# Patient Record
Sex: Male | Born: 1965 | Race: White | Hispanic: No | Marital: Married | Smoking: Never smoker
Health system: Southern US, Community
[De-identification: ages and names within clinical notes are randomized; demographics above are authoritative.]

## PROBLEM LIST (undated history)

## (undated) DIAGNOSIS — E785 Hyperlipidemia, unspecified: Secondary | ICD-10-CM

## (undated) DIAGNOSIS — N3281 Overactive bladder: Secondary | ICD-10-CM

## (undated) DIAGNOSIS — R030 Elevated blood-pressure reading, without diagnosis of hypertension: Secondary | ICD-10-CM

## (undated) HISTORY — DX: Elevated blood-pressure reading, without diagnosis of hypertension: R03.0

## (undated) HISTORY — PX: BACK SURGERY: SHX140

## (undated) HISTORY — DX: Overactive bladder: N32.81

## (undated) HISTORY — DX: Hyperlipidemia, unspecified: E78.5

---

## 1994-07-30 HISTORY — PX: OTHER SURGICAL HISTORY: SHX169

## 1995-07-31 HISTORY — PX: RHINOPLASTY: SHX2354

## 2007-05-27 ENCOUNTER — Ambulatory Visit: Payer: Self-pay | Admitting: Family Medicine

## 2007-05-27 DIAGNOSIS — J069 Acute upper respiratory infection, unspecified: Secondary | ICD-10-CM | POA: Insufficient documentation

## 2007-05-27 DIAGNOSIS — K5190 Ulcerative colitis, unspecified, without complications: Secondary | ICD-10-CM | POA: Insufficient documentation

## 2007-05-27 DIAGNOSIS — L259 Unspecified contact dermatitis, unspecified cause: Secondary | ICD-10-CM | POA: Insufficient documentation

## 2007-09-01 ENCOUNTER — Ambulatory Visit: Payer: Self-pay | Admitting: Family Medicine

## 2007-09-01 DIAGNOSIS — J019 Acute sinusitis, unspecified: Secondary | ICD-10-CM | POA: Insufficient documentation

## 2007-11-20 ENCOUNTER — Ambulatory Visit: Payer: Self-pay | Admitting: Family Medicine

## 2007-11-25 ENCOUNTER — Encounter: Payer: Self-pay | Admitting: Family Medicine

## 2007-11-25 LAB — CONVERTED CEMR LAB
ALT: 21 units/L (ref 0–53)
AST: 26 units/L (ref 0–37)
Albumin: 4.5 g/dL (ref 3.5–5.2)
CO2: 23 meq/L (ref 19–32)
Creatinine, Ser: 1.27 mg/dL (ref 0.40–1.50)
MCHC: 33.2 g/dL (ref 30.0–36.0)
MCV: 89.5 fL (ref 78.0–100.0)
RBC: 4.78 M/uL (ref 4.22–5.81)
Sodium: 143 meq/L (ref 135–145)
Total Bilirubin: 0.8 mg/dL (ref 0.3–1.2)
Total Protein: 6.8 g/dL (ref 6.0–8.3)
Triglycerides: 116 mg/dL (ref <150)
VLDL: 23 mg/dL (ref 0–40)

## 2007-11-26 ENCOUNTER — Encounter: Payer: Self-pay | Admitting: Family Medicine

## 2008-03-01 ENCOUNTER — Encounter: Payer: Self-pay | Admitting: Family Medicine

## 2008-03-03 ENCOUNTER — Encounter: Payer: Self-pay | Admitting: Family Medicine

## 2008-03-05 ENCOUNTER — Telehealth: Payer: Self-pay | Admitting: Family Medicine

## 2008-03-08 ENCOUNTER — Encounter: Payer: Self-pay | Admitting: Family Medicine

## 2008-04-01 ENCOUNTER — Encounter: Payer: Self-pay | Admitting: Family Medicine

## 2008-04-19 ENCOUNTER — Encounter: Payer: Self-pay | Admitting: Family Medicine

## 2008-06-10 ENCOUNTER — Encounter: Payer: Self-pay | Admitting: Family Medicine

## 2008-09-21 ENCOUNTER — Ambulatory Visit: Payer: Self-pay | Admitting: Occupational Medicine

## 2008-09-21 DIAGNOSIS — M940 Chondrocostal junction syndrome [Tietze]: Secondary | ICD-10-CM | POA: Insufficient documentation

## 2008-12-16 ENCOUNTER — Encounter: Payer: Self-pay | Admitting: Family Medicine

## 2009-02-10 ENCOUNTER — Encounter: Payer: Self-pay | Admitting: Family Medicine

## 2009-02-23 ENCOUNTER — Ambulatory Visit: Payer: Self-pay | Admitting: Family Medicine

## 2009-02-23 DIAGNOSIS — R0602 Shortness of breath: Secondary | ICD-10-CM | POA: Insufficient documentation

## 2009-02-24 ENCOUNTER — Encounter: Payer: Self-pay | Admitting: Family Medicine

## 2009-03-01 LAB — CONVERTED CEMR LAB
ALT: 17 U/L
AST: 21 U/L
Albumin: 4.6 g/dL
Alkaline Phosphatase: 80 U/L
BUN: 17 mg/dL
CO2: 27 meq/L
Calcium: 9.5 mg/dL
Chloride: 104 meq/L
Cholesterol: 202 mg/dL — ABNORMAL HIGH
Creatinine, Ser: 1.19 mg/dL
Glucose, Bld: 81 mg/dL
HCT: 47.2 %
HDL: 42 mg/dL
Hemoglobin: 16.2 g/dL
LDL Cholesterol: 135 mg/dL — ABNORMAL HIGH
MCHC: 34.3 g/dL
MCV: 90.1 fL
Platelets: 205 10*3/uL
Potassium: 4.2 meq/L
RBC: 5.24 M/uL
RDW: 13.2 %
Sodium: 140 meq/L
Total Bilirubin: 0.8 mg/dL
Total CHOL/HDL Ratio: 4.8
Total Protein: 7 g/dL
Triglycerides: 123 mg/dL
VLDL: 25 mg/dL
WBC: 5.7 10*3/uL

## 2009-04-07 ENCOUNTER — Encounter: Payer: Self-pay | Admitting: Family Medicine

## 2009-04-08 ENCOUNTER — Ambulatory Visit: Payer: Self-pay | Admitting: Family Medicine

## 2009-05-17 ENCOUNTER — Encounter: Payer: Self-pay | Admitting: Family Medicine

## 2009-12-30 ENCOUNTER — Ambulatory Visit: Payer: Self-pay | Admitting: Family Medicine

## 2009-12-30 DIAGNOSIS — S63509A Unspecified sprain of unspecified wrist, initial encounter: Secondary | ICD-10-CM | POA: Insufficient documentation

## 2010-01-05 ENCOUNTER — Ambulatory Visit: Payer: Self-pay | Admitting: Family Medicine

## 2010-01-12 ENCOUNTER — Encounter: Admission: RE | Admit: 2010-01-12 | Discharge: 2010-01-12 | Payer: Self-pay | Admitting: Sports Medicine

## 2010-01-23 ENCOUNTER — Encounter: Payer: Self-pay | Admitting: Family Medicine

## 2010-02-28 ENCOUNTER — Encounter: Payer: Self-pay | Admitting: Family Medicine

## 2010-03-03 ENCOUNTER — Encounter: Payer: Self-pay | Admitting: Family Medicine

## 2010-03-06 ENCOUNTER — Encounter: Payer: Self-pay | Admitting: Family Medicine

## 2010-04-05 ENCOUNTER — Encounter: Payer: Self-pay | Admitting: Family Medicine

## 2010-05-19 ENCOUNTER — Encounter: Payer: Self-pay | Admitting: Family Medicine

## 2010-08-24 ENCOUNTER — Encounter: Payer: Self-pay | Admitting: Family Medicine

## 2010-08-29 NOTE — Assessment & Plan Note (Signed)
Summary: RIGHT WRIST PAIN   Vital Signs:  Patient Profile:   45 Years Old Male CC:      right wrist injury f/u Height:     72.75 inches O2 Sat:      97 % O2 treatment:    Room Air Temp:     98.4 degrees F oral Pulse rate:   93 / minute Resp:     14 per minute BP sitting:   138 / 82  (left arm) Cuff size:   large  Pt. in pain?   yes    Location:   right wrist    Intensity:   4    Type:       aching  Vitals Entered By: Lajean Saver RN (January 05, 2010 1:51 PM)                   Current Allergies (reviewed today): No known allergies History of Present Illness Chief Complaint: right wrist injury f/u History of Present Illness: RECHECK INJURY TO RIGHT WRIST. SEEN HERE 5-6 DAYS AGO. XRAY SHOWS POSS LIGMENTIS INJURY . NO FX. WORE AN ACE WRAP AND SPLINT FOR A FEW DAYS. STATES SOME IMPROVMENT. SWELLING. DOWN. NO NUMBNESS OR TINGLING.   REVIEW OF SYSTEMS Constitutional Symptoms      Denies fever, chills, night sweats, weight loss, weight gain, and fatigue.  Eyes       Denies change in vision, eye pain, eye discharge, glasses, contact lenses, and eye surgery. Ear/Nose/Throat/Mouth       Denies hearing loss/aids, change in hearing, ear pain, ear discharge, dizziness, frequent runny nose, frequent nose bleeds, sinus problems, sore throat, hoarseness, and tooth pain or bleeding.  Respiratory       Denies dry cough, productive cough, wheezing, shortness of breath, asthma, bronchitis, and emphysema/COPD.  Cardiovascular       Denies murmurs, chest pain, and tires easily with exhertion.    Gastrointestinal       Denies stomach pain, nausea/vomiting, diarrhea, constipation, blood in bowel movements, and indigestion. Genitourniary       Denies painful urination, kidney stones, and loss of urinary control. Neurological       Denies paralysis, seizures, and fainting/blackouts. Musculoskeletal       Complains of joint pain, decreased range of motion, and swelling.      Denies muscle  pain, joint stiffness, redness, muscle weakness, and gout.      Comments: right wrist Skin       Denies bruising, unusual mles/lumps or sores, and hair/skin or nail changes.  Psych       Denies mood changes, temper/anger issues, anxiety/stress, speech problems, depression, and sleep problems.  Past History:  Past Medical History: Reviewed history from 11/20/2007 and no changes required. ulcerative colitis -  hearing loss, R ear with hearing aid (new one in 2008) OAB or BPH?  Dr Janna [facility]   Past Surgical History: Reviewed history from 05/27/2007 and no changes required. colonoscopies, last one end of 2007 rhinoplasty 1997 R ear surgery for scar tissue removal 1996  Family History: Reviewed history from 05/27/2007 and no changes required. mother high chol and HTN father died at 86 ? cause 2 brothers healthy  Social History: Reviewed history from 12/30/2009 and no changes required. 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  Mom lives with them. Married to [employer].  2 kids. Never smoked. No ETOH. Works out 3 days / wk. Physical Exam General appearance:  well developed, well nourished, no acute distress Extremities: NO SWELLING OR DEFORMITY. PAIN ULNAR SIDE OF THE WRIST WITH ROM AND ABDUCTION. SLIGHT PAIN WITH EXTENTION OF THE THUMB. N/V INTACT.  SUPSPECT POSS TFC INJURY. WILL TX WITH PREDNISONE AND SPLINT FOR 7 DAYS. IF NO IMPROVEMENT WITLL RECOMMEND ORTHO HAND EVAL.   Plan New Medications/Changes: PREDNISONE (PAK) 5 MG TABS (PREDNISONE) TAKE AS DIRECTED WITH FOOD  #1 PK  6 DAY x 0, 01/05/2010, Marvis Moeller DO  New Orders: Est. Patient Level II [13086]   Prescriptions: PREDNISONE (PAK) 5 MG TABS (PREDNISONE) TAKE AS DIRECTED WITH FOOD  #1 PK  6 DAY x 0   Entered and Authorized by:   Marvis Moeller DO   Signed by:   Marvis Moeller DO on 01/05/2010   Method used:   Print then Give to Patient   RxID:    [id number]   Patient Instructions: 1)  WEAR THE SPLINT 24/7, APPLLY HEAT THREE TIMES DAILY FOR 15 MIN. IF NO IMPROVEMENT AT 7 DAYS RECOMMEND ORHO EVAL.   Orders Added: 1)  Est. Patient Level II [44010]

## 2010-08-29 NOTE — Letter (Signed)
Summary: Out of Work  [facility]  [address]   [place], [place] [address]   Phone: [phone]  Fax: [phone]    January 05, 2010   Employee:  Benjamin Leonard    To Whom It May Concern:   For Medical reasons, please excuse the above named employee from work for the following dates:  Start:   04 January 2010  End:   11 January 2010  If you need additional information, please feel free to contact our office.         Sincerely,    Marvis Moeller DO

## 2010-08-29 NOTE — Letter (Signed)
Summary: Out of Work  [facility]  [address]   [place], [place] [address]   Phone: [phone]  Fax: [phone]    January 05, 2010   Employee:  SANDON YOHO    To Whom It May Concern:   For Medical reasons, please excuse the above named employee from work for the following dates:  Start:   05 January 2010  End:   12 January 2010  If you need additional information, please feel free to contact our office.         Sincerely,    Marvis Moeller DO

## 2010-08-29 NOTE — Miscellaneous (Signed)
Summary: colonscopy: ulcerative colitis, rectum only  Clinical Lists Changes  Observations: Added new observation of FLEXSIGDUE: 03/01/2013 (03/03/2010 12:50) Added new observation of COLONOSCOPY: Location:  [facility].    abnormal mucosa in the rectum compatible with ulcerative colitis (biopsied). o/w normal.    (03/02/2010 12:51)        Colonoscopy  Procedure date:  03/02/2010  Findings:      Location:  [facility].    abnormal mucosa in the rectum compatible with ulcerative colitis (biopsied). o/w normal.      Colonoscopy  Procedure date:  03/02/2010  Findings:      Location:  [facility].    abnormal mucosa in the rectum compatible with ulcerative colitis (biopsied). o/w normal.

## 2010-08-29 NOTE — Letter (Signed)
Summary: Letter to Patient Regarding Colonoscopy Results/[facility]  Letter to Patient Regarding Colonoscopy Results/[facility]   Imported By: Lanelle Bal 03/14/2010 11:39:18  _____________________________________________________________________  External Attachment:    Type:   Image     Comment:   External Document

## 2010-08-29 NOTE — Letter (Signed)
Summary: Out of Work  [facility]  [address]   [place], [place] [address]   Phone: [phone]  Fax: [phone]    December 30, 2009   Employee:  ALIF PETRAK    To Whom It May Concern:   For Medical reasons, please excuse the above named employee from work for one week.  Start:   December 30, 2009  End:   January 06, 2010  If you need additional information, please feel free to contact our office.         Sincerely,    Donna Christen MD

## 2010-08-29 NOTE — Letter (Signed)
Summary: Letter to Patient with Lab Results/[facility]   Letter to Patient with Lab Results/[facility]   Imported By: Lanelle Bal 03/09/2010 12:35:14  _____________________________________________________________________  External Attachment:    Type:   Image     Comment:   External Document

## 2010-08-29 NOTE — Letter (Signed)
Summary: External Other  External Other   Imported By: Haskell Riling 01/23/2010 11:22:25  _____________________________________________________________________  External Attachment:    Type:   Image     Comment:   External Document

## 2010-08-29 NOTE — Consult Note (Signed)
Summary: [facility]  [facility]   Imported By: Lanelle Bal 04/13/2010 11:49:34  _____________________________________________________________________  External Attachment:    Type:   Image     Comment:   External Document

## 2010-08-29 NOTE — Assessment & Plan Note (Signed)
Summary: R WRIST INJURY/WB   Vital Signs:  Patient Profile:   45 Years Old Male CC:      injury to right wrist X yesterday Height:     72.75 inches Weight:      191 pounds O2 Sat:      100 % O2 treatment:    Room Air Temp:     97.3 degrees F oral Pulse rate:   56 / minute Pulse rhythm:   regular Resp:     16 per minute BP sitting:   137 / 78  (left arm) Cuff size:   large  Pt. in pain?   yes    Location:   right wrist    Intensity:   4    Type:       aching  Vitals Entered By: Lajean Saver RN (December 30, 2009 1:11 PM)                   Updated Prior Medication List: LIALDA 1.2 GM TBEC (MESALAMINE) 2 tabs by mouth daily  Current Allergies (reviewed today): No known allergies History of Present Illness Chief Complaint: injury to right wrist X yesterday History of Present Illness: Subjective:  Patient injured his right wrist yesterday with a karati chop to a board.  Complains of pain in both ulnar wrist and thumb area.  REVIEW OF SYSTEMS Constitutional Symptoms      Denies fever, chills, night sweats, weight loss, weight gain, and fatigue.  Eyes       Denies change in vision, eye pain, eye discharge, glasses, contact lenses, and eye surgery. Ear/Nose/Throat/Mouth       Denies hearing loss/aids, change in hearing, ear pain, ear discharge, dizziness, frequent runny nose, frequent nose bleeds, sinus problems, sore throat, hoarseness, and tooth pain or bleeding.  Respiratory       Denies dry cough, productive cough, wheezing, shortness of breath, asthma, bronchitis, and emphysema/COPD.  Cardiovascular       Denies murmurs, chest pain, and tires easily with exhertion.    Gastrointestinal       Denies stomach pain, nausea/vomiting, diarrhea, constipation, blood in bowel movements, and indigestion. Genitourniary       Denies painful urination, kidney stones, and loss of urinary control. Neurological       Denies paralysis, seizures, and  fainting/blackouts. Musculoskeletal       Complains of joint pain, joint stiffness, decreased range of motion, and swelling.      Denies muscle pain, redness, muscle weakness, and gout.      Comments: right wrist Skin       Denies bruising, unusual mles/lumps or sores, and hair/skin or nail changes.  Psych       Denies mood changes, temper/anger issues, anxiety/stress, speech problems, depression, and sleep problems. Other Comments: patient was attempting to break boards with his right hand and came down on the base of his wrist    Past History:  Past Medical History: Reviewed history from 11/20/2007 and no changes required. ulcerative colitis -  hearing loss, R ear with hearing aid (new one in 2008) OAB or BPH?  Dr Janna [facility]   Past Surgical History: Reviewed history from 05/27/2007 and no changes required. colonoscopies, last one end of 2007 rhinoplasty 1997 R ear surgery for scar tissue removal 1996  Family History: Reviewed history from 05/27/2007 and no changes required. mother high chol and HTN father died at 65 ? 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  Mom lives with them. Married to [employer].  2 kids. Never smoked. No ETOH. Works out 3 days / wk.   Objective:  No acute distress  Right wrist:  No swelling or deformity.  Decreased range of motion.  No snuffbox tenderness.  Thumb has decreased range of motion.  There is mild tenderness over thumb extensors.  Distal neurovascular intact  X-ray right wrist:  No fracture.  Slight widening of the luno-triquetral joint space. Assessment New Problems: WRIST SPRAIN, RIGHT (ICD-842.00)   Plan New Medications/Changes: NAPROXEN 500 MG TABS (NAPROXEN) One by mouth two times a day pc  #20 x 1, 12/30/2009, Donna Christen MD  New Orders: T-DG Wrist Complete*R* [73110] Wrist/Thumb Spica [L3923] Ace  Bandage < 3in. [R6045] Est.  Patient Level III [40981] Planning Comments:   Ace wrap and thumb spica splint applied.  Apply ice pack for 30 to 45 minutes every 1 to 4 hours.  Continue until swelling decreases.   Begin Naproxen. Begin wrist exercises in about 5 days ([facility] information and instruction patient handout given)  Follow-up with PCP if not improving 7 to 10 days.   The patient and/or caregiver has been counseled thoroughly with regard to medications prescribed including dosage, schedule, interactions, rationale for use, and possible side effects and they verbalize understanding.  Diagnoses and expected course of recovery discussed and will return if not improved as expected or if the condition worsens. Patient and/or caregiver verbalized understanding.  Prescriptions: NAPROXEN 500 MG TABS (NAPROXEN) One by mouth two times a day pc  #20 x 1   Entered and Authorized by:   Donna Christen MD   Signed by:   Donna Christen MD on 12/30/2009   Method used:   Print then Give to Patient   RxID:   [id number]   Orders Added: 1)  T-DG Wrist Complete*R* [73110] 2)  Wrist/Thumb Spica [L3923] 3)  Ace  Bandage < 3in. [V7846] 4)  Est. Patient Level III [96295]

## 2010-08-29 NOTE — Consult Note (Signed)
Summary: [facility]  [facility]   Imported By: Lanelle Bal 06/10/2010 11:53:46  _____________________________________________________________________  External Attachment:    Type:   Image     Comment:   External Document

## 2010-09-20 NOTE — Letter (Signed)
Summary: [facility]   [facility]   Imported By: Kassie Mends 09/14/2010 08:45:32  _____________________________________________________________________  External Attachment:    Type:   Image     Comment:   External Document

## 2010-10-10 ENCOUNTER — Encounter: Payer: Self-pay | Admitting: Internal Medicine

## 2010-10-19 ENCOUNTER — Encounter: Payer: Self-pay | Admitting: Internal Medicine

## 2010-10-19 ENCOUNTER — Ambulatory Visit (INDEPENDENT_AMBULATORY_CARE_PROVIDER_SITE_OTHER): Payer: BC Managed Care – PPO | Admitting: Internal Medicine

## 2010-10-19 DIAGNOSIS — M25562 Pain in left knee: Secondary | ICD-10-CM | POA: Insufficient documentation

## 2010-10-19 DIAGNOSIS — N529 Male erectile dysfunction, unspecified: Secondary | ICD-10-CM

## 2010-10-19 DIAGNOSIS — K5190 Ulcerative colitis, unspecified, without complications: Secondary | ICD-10-CM

## 2010-10-19 DIAGNOSIS — R5383 Other fatigue: Secondary | ICD-10-CM | POA: Insufficient documentation

## 2010-10-19 DIAGNOSIS — J309 Allergic rhinitis, unspecified: Secondary | ICD-10-CM

## 2010-10-19 DIAGNOSIS — M25569 Pain in unspecified knee: Secondary | ICD-10-CM

## 2010-10-19 DIAGNOSIS — M25561 Pain in right knee: Secondary | ICD-10-CM

## 2010-10-19 DIAGNOSIS — J302 Other seasonal allergic rhinitis: Secondary | ICD-10-CM

## 2010-10-19 DIAGNOSIS — R5381 Other malaise: Secondary | ICD-10-CM | POA: Insufficient documentation

## 2010-10-19 DIAGNOSIS — Z0000 Encounter for general adult medical examination without abnormal findings: Secondary | ICD-10-CM

## 2010-10-19 DIAGNOSIS — J3089 Other allergic rhinitis: Secondary | ICD-10-CM | POA: Insufficient documentation

## 2010-10-19 DIAGNOSIS — M2550 Pain in unspecified joint: Secondary | ICD-10-CM

## 2010-10-19 NOTE — Assessment & Plan Note (Signed)
Normal exam Pt noted stiffness after prolonged sitting.  Question symptoms extra GI manifestations of IBD Check sed rate Consider rheumatology eval

## 2010-10-19 NOTE — Patient Instructions (Addendum)
Take vitamin D 3 1000 units once daily Our office will contact you re:  Blood test results

## 2010-10-19 NOTE — Assessment & Plan Note (Signed)
Reviewed adult health maintenance protocols. Pt uptodate with adult vaccines Obtain screening lipid panel Continue regular exerise - weight is within normal limits  Screening colonoscopy performed q 2 yrs by GI

## 2010-10-19 NOTE — Assessment & Plan Note (Signed)
Pt with chronic fatigue of unclear etiology.  He is concerned he may have underactive thyroid. No obvious abnormality on exam  Rule out hypogonadism, hypothyroidism  Consider adrenal insuff,  Chronic allergies

## 2010-10-19 NOTE — Progress Notes (Signed)
  Subjective:    Patient ID: Benjamin Leonard, male    DOB: 04-Jun-1966, 45 y.o.   MRN: [id number]  HPI 45 y/o male to x fer care, routine, cpx and several complaints.  Diagnosed with UC age 30.  He was passing blood, cramping, loose stools Pt told he has mild case.   Since [facility]  [facility] Dr. Jason Fila Karis [place]) Colonoscopy q 2 yrs Tends to gets more proctitis -  Uses canasa supp with great response  Originally from [place] Works as free Conservation officer, historic buildings.   C/o severe fatigue -  " I could sleep all day",  Joint pains - esp knees Feels foggy - head is in a fog Reports rash on face from folliculitis took too long to heal  Mild ED.   Normal libido.   Able to get erection but lack of firmness  Pt exercises on regular basis.   Doing P90X but wt around abd not coming off  Intermittent cough  Weak stream.  BPH symptoms- followed by urologist in [place].  Tried vesicare and uroxatral uroxatral worked for 1-2 yrs but lost effectiveness   Review of Systems  Constitutional: Positive for fatigue. Negative for fever, chills and unexpected weight change.  HENT: Positive for congestion and postnasal drip. Negative for rhinorrhea.   Eyes: Negative for pain and visual disturbance.  Respiratory: Negative for cough, chest tightness and shortness of breath.   Cardiovascular: Negative for chest pain and palpitations.  Gastrointestinal: Negative for abdominal distention.  Genitourinary: Positive for difficulty urinating. Negative for urgency and genital sores.  Musculoskeletal: Positive for myalgias and arthralgias. Negative for joint swelling.  Psychiatric/Behavioral: Negative for behavioral problems and confusion.       Objective:   Physical Exam  Constitutional: He is oriented to person, place, and time. He appears well-developed and well-nourished.  HENT:  Head: Normocephalic.  Right Ear: External ear normal.  Left Ear: External ear normal.  Eyes: Pupils are equal, round,  and reactive to light.  Neck: Normal range of motion. Neck supple. No thyromegaly present.  Cardiovascular: Normal rate, regular rhythm and normal heart sounds.   No murmur heard. Pulmonary/Chest: Effort normal and breath sounds normal.  Abdominal: Soft. Bowel sounds are normal. He exhibits no mass. There is no tenderness. There is no guarding.  Genitourinary: Rectum normal, prostate normal and penis normal.  Musculoskeletal: Normal range of motion. He exhibits no tenderness.  Neurological: He is alert and oriented to person, place, and time.  Skin: Skin is dry. No rash noted. No erythema.  Psychiatric: He has a normal mood and affect. His behavior is normal.          Assessment & Plan:

## 2010-10-24 LAB — HEPATIC FUNCTION PANEL
ALT: 17 U/L (ref 0–53)
Albumin: 4.4 g/dL (ref 3.5–5.2)
Alkaline Phosphatase: 68 U/L (ref 39–117)
Indirect Bilirubin: 0.6 mg/dL (ref 0.0–0.9)
Total Protein: 7 g/dL (ref 6.0–8.3)

## 2010-10-24 LAB — LIPID PANEL
LDL Cholesterol: 139 mg/dL — ABNORMAL HIGH (ref 0–99)
Triglycerides: 104 mg/dL (ref <150)
VLDL: 21 mg/dL (ref 0–40)

## 2010-10-24 LAB — TSH: TSH: 1.575 u[IU]/mL (ref 0.350–4.500)

## 2010-10-24 LAB — CORTISOL: Cortisol, Plasma: 10 ug/dL

## 2010-10-24 LAB — BASIC METABOLIC PANEL WITH GFR
BUN: 15 mg/dL (ref 6–23)
Chloride: 102 mEq/L (ref 96–112)
Creat: 1.15 mg/dL (ref 0.40–1.50)
GFR, Est Non African American: >60 mL/min (ref >60)
Glucose, Bld: 86 mg/dL (ref 70–99)

## 2010-10-24 LAB — C-REACTIVE PROTEIN: CRP: 0.1 mg/dL (ref <0.6)

## 2010-10-24 LAB — SEDIMENTATION RATE

## 2010-10-24 LAB — TESTOSTERONE: Testosterone: 342.38 ng/dL (ref 250–890)

## 2010-10-30 ENCOUNTER — Telehealth: Payer: Self-pay | Admitting: *Deleted

## 2010-10-30 ENCOUNTER — Encounter: Payer: Self-pay | Admitting: Internal Medicine

## 2010-10-30 NOTE — Telephone Encounter (Signed)
Call returned to patient at [phone], he was informed per Dr Artist Pais instructions

## 2010-10-30 NOTE — Telephone Encounter (Signed)
Patient called and left voice message requesting lab results from his last office visit

## 2010-10-30 NOTE — Telephone Encounter (Signed)
Call pt - lab results were normal.  They were routed to Dr. Cathey Endow by mistake.  I will send letter re:  Lab results

## 2010-10-31 ENCOUNTER — Telehealth: Payer: Self-pay | Admitting: *Deleted

## 2010-10-31 DIAGNOSIS — R5382 Chronic fatigue, unspecified: Secondary | ICD-10-CM

## 2010-10-31 NOTE — Telephone Encounter (Signed)
Patient called and left voice message stating he received his lab results and they were normal. He states he had discussed with Dr Artist Pais about arranging a referral to Endocrinology for evaluation of Adrenal gland to see if there were any abnormalities there, and would ike to know if this is still an option.

## 2010-11-01 NOTE — Telephone Encounter (Signed)
Call placed to patient [phone], he was informed of Endocrine referral. Patient advised if he does not hear anything regarding the referral within a week, he was to call back. Patient has verbalized understanding and agrees

## 2010-11-10 ENCOUNTER — Telehealth: Payer: Self-pay | Admitting: *Deleted

## 2010-11-10 NOTE — Telephone Encounter (Signed)
Patient called and left voice message stating he was referred to a endocrinologist at [facility] and they are not able to see him until May. His message states that he has found a provider closer to where he lives [employer] [place] in [place]. He states he has scheduled an appointment with them for 2 weeks and would like to know if Dr Artist Pais would send a referral and his labs results to Fax [phone]

## 2010-11-14 NOTE — Telephone Encounter (Signed)
Ok to change referral to endo in [place]

## 2010-11-15 NOTE — Telephone Encounter (Signed)
Call placed to patient at [phone],he was informed referral information would be updated

## 2010-12-07 ENCOUNTER — Encounter: Payer: Self-pay | Admitting: Internal Medicine

## 2010-12-08 ENCOUNTER — Ambulatory Visit: Payer: BC Managed Care – PPO | Admitting: Internal Medicine

## 2010-12-11 ENCOUNTER — Ambulatory Visit: Payer: BC Managed Care – PPO | Admitting: Internal Medicine

## 2010-12-12 ENCOUNTER — Ambulatory Visit: Payer: BC Managed Care – PPO | Admitting: Internal Medicine

## 2010-12-20 ENCOUNTER — Ambulatory Visit (HOSPITAL_BASED_OUTPATIENT_CLINIC_OR_DEPARTMENT_OTHER)
Admission: RE | Admit: 2010-12-20 | Discharge: 2010-12-20 | Disposition: A | Discharge status: Home / Self Care | Payer: BC Managed Care – PPO | Source: Ambulatory Visit | Attending: Internal Medicine | Admitting: Internal Medicine

## 2010-12-20 ENCOUNTER — Encounter: Payer: Self-pay | Admitting: Internal Medicine

## 2010-12-20 ENCOUNTER — Ambulatory Visit (INDEPENDENT_AMBULATORY_CARE_PROVIDER_SITE_OTHER): Payer: BC Managed Care – PPO | Admitting: Internal Medicine

## 2010-12-20 VITALS — BP 116/80 | HR 57 | Temp 97.6°F | Resp 16 | Wt 200.0 lb

## 2010-12-20 DIAGNOSIS — I498 Other specified cardiac arrhythmias: Secondary | ICD-10-CM

## 2010-12-20 DIAGNOSIS — R0989 Other specified symptoms and signs involving the circulatory and respiratory systems: Secondary | ICD-10-CM | POA: Insufficient documentation

## 2010-12-20 DIAGNOSIS — R0609 Other forms of dyspnea: Secondary | ICD-10-CM

## 2010-12-20 DIAGNOSIS — Z136 Encounter for screening for cardiovascular disorders: Secondary | ICD-10-CM

## 2010-12-20 DIAGNOSIS — R0600 Dyspnea, unspecified: Secondary | ICD-10-CM

## 2010-12-20 DIAGNOSIS — R5383 Other fatigue: Secondary | ICD-10-CM

## 2010-12-20 DIAGNOSIS — R001 Bradycardia, unspecified: Secondary | ICD-10-CM

## 2010-12-20 DIAGNOSIS — R5381 Other malaise: Secondary | ICD-10-CM

## 2010-12-20 NOTE — Assessment & Plan Note (Signed)
No obvious cause identified. Thyroid testing, CBCD, LFT's are all normal Testing for adrenal insuff normal.    Question sleep apnea.  Arrange overnight pulse ox screen.

## 2010-12-20 NOTE — Assessment & Plan Note (Signed)
It is unclear whether bradycardia contributing to fatigue or weakness.  He is not a runner / does not perform regular aerobic exercise that might explain bradycardia. Refer to Dr. Jens Som for further cardiac testing.

## 2010-12-20 NOTE — Progress Notes (Signed)
Subjective:    Patient ID: Benjamin Leonard, male    DOB: 10-29-1965, 45 y.o.   MRN: [id number]  HPI 45 y/o male prev seen with complaints of chronic fatigue for follow up.  Our initial workup was negative.   Pt seen by endocrinologist.  Cosyntropin testing negative.  Adrenal function reported normal.  He has persisten fatigue. Muscles feel tired.  Symptoms wax and wane.    Testosterone level was low normal.  He notes libido is normal.  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  Lab data reviewed in detail with patient.  Review of Systems No chest pain.  He noticed dyspnea after pulling large box at local hardware store.  He denies depressive symptoms.  Sleeps 8 hrs per night.   Mild snoring.  Freq falls asleep while reading.  Past Medical History  Diagnosis Date  . Ulcerative colitis   . Hearing loss in right ear     with hearing aid (new one in 2008)  . OAB (overactive bladder)     [facility]ictor Rhetta M[facility])  . Ulcerative colitis   . Hearing loss     Right ear w/ hearing aid (new one in 2008)  . OAB (overactive bladder)     or BPH?    History   Social History  . Marital Status: Married    Spouse Name: Benjamin Leonard    Number of Children: 2  . Years of Education: N/A   Occupational History  . Freelance Wrtier, Product/process development scientist, also works for [employer]    Social History Main Topics  . Smoking status: Never Smoker   . Smokeless tobacco: Not on file  . Alcohol Use: No  . Drug Use: Not on file  . Sexually Active: Not on file   Other Topics Concern  . Not on file   Social History Narrative   Moved from [place] in 2008. Mom lives with them.Regular exercise:  Works out 3 days a week.2 children12 and 7Daughter 12 is type I diabeticMother has platelet disorder (thrombocytosis)Maternal grand parents - DM II    Past Surgical History  Procedure Date  . Rhinoplasty 1997  . Rhinoplasty 1997  . Right ear surgery for scar tissue removal  1996    Family History  Problem Relation Age of Onset  . Hyperlipidemia Mother   . Hypertension Mother     No Known Allergies  Current Outpatient Prescriptions on File Prior to Visit  Medication Sig Dispense Refill  . mesalamine (LIALDA) 1.2 G EC tablet Take by mouth. Take 2 tablets by mouth daily.       . naproxen (NAPROSYN) 500 MG tablet Take 500 mg by mouth 2 (two) times daily with a meal.        . predniSONE (DELTASONE) 5 MG tablet Take 5 mg by mouth daily.          BP 116/80  Pulse 57  Temp(Src) 97.6 F (36.4 C) (Oral)  Resp 16  Wt 200 lb (90.719 kg)  SpO2 100%  EKG:  Sinus bradycardia at 49 bpm.   CXR:  normal    Objective:   Physical Exam    Constitutional: Appears well-developed and well-nourished. No distress.  Head: Normocephalic and atraumatic.  Right Ear: External ear normal.  Left Ear: External ear normal.  Mouth/Throat: Oropharynx is clear and moist.  Eyes: Conjunctivae are normal. Pupils are equal, round, and reactive to light.  Neck: Normal range  of motion. Neck supple. No thyromegaly present. No carotid bruit.  No cervical adenopathy Cardiovascular: Normal rate, regular rhythm and normal heart sounds.  Exam reveals no gallop and no friction rub.   No murmur heard. Pulmonary/Chest: Effort normal and breath sounds normal.  No wheezes. No rales.  Abdominal: Soft. Bowel sounds are normal. No mass. There is no tenderness.   No hepatosplenomegaly Neurological: Alert. No cranial nerve deficit.  Skin: Skin is warm and dry.  Psychiatric: Normal mood and affect. Behavior is normal.      Assessment & Plan:

## 2011-01-01 ENCOUNTER — Telehealth: Payer: Self-pay | Admitting: Family

## 2011-01-01 DIAGNOSIS — R5383 Other fatigue: Secondary | ICD-10-CM

## 2011-01-01 NOTE — Telephone Encounter (Signed)
Call from pt  :  Pt thought that Dr Artist Pais was ordering a Pulse ox   , see in notes but I don't have referral, can you send a referral, I will send it to [facility]

## 2011-01-16 ENCOUNTER — Encounter: Payer: Self-pay | Admitting: Cardiology

## 2011-01-17 ENCOUNTER — Ambulatory Visit (INDEPENDENT_AMBULATORY_CARE_PROVIDER_SITE_OTHER): Payer: BC Managed Care – PPO | Admitting: Cardiology

## 2011-01-17 ENCOUNTER — Encounter: Payer: Self-pay | Admitting: Cardiology

## 2011-01-17 ENCOUNTER — Ambulatory Visit: Payer: BC Managed Care – PPO | Admitting: Internal Medicine

## 2011-01-17 ENCOUNTER — Telehealth: Payer: Self-pay | Admitting: Internal Medicine

## 2011-01-17 DIAGNOSIS — R001 Bradycardia, unspecified: Secondary | ICD-10-CM

## 2011-01-17 DIAGNOSIS — R079 Chest pain, unspecified: Secondary | ICD-10-CM | POA: Insufficient documentation

## 2011-01-17 DIAGNOSIS — R072 Precordial pain: Secondary | ICD-10-CM

## 2011-01-17 DIAGNOSIS — I491 Atrial premature depolarization: Secondary | ICD-10-CM | POA: Insufficient documentation

## 2011-01-17 DIAGNOSIS — R0902 Hypoxemia: Secondary | ICD-10-CM

## 2011-01-17 DIAGNOSIS — R400 Somnolence: Secondary | ICD-10-CM

## 2011-01-17 DIAGNOSIS — K5190 Ulcerative colitis, unspecified, without complications: Secondary | ICD-10-CM

## 2011-01-17 DIAGNOSIS — I498 Other specified cardiac arrhythmias: Secondary | ICD-10-CM

## 2011-01-17 NOTE — Telephone Encounter (Signed)
Call placed to patient at [phone], he states that he sleep with machine over night which tested his breathing and the next day the machine was picked up the next day.  Call was placed to [facility] [phone] spoke with [facility]. She stated there was not a signature on the order form and the signature is needed in order for the results to be release. Fax number to [facility] [phone]  Order has been re-printed and stamped with signature and faxed to [facility] at  [phone].   Test results have been received and sent for review.

## 2011-01-17 NOTE — Progress Notes (Signed)
HPI: 45 year old male with no prior cardiac history for evaluation of bradycardia and fatigue. Recent TSH normal. Patient noted in March that he was having increased fatigue. Workup by primary care and endocrinology unrevealing. In followup he was noted to have a heart rate of 49 on electrocardiogram. Cardiology was therefore asked to further evaluate. Note he has dyspnea with more extreme activities but not with routine activities. There is no orthopnea, PND, pedal edema, palpitations or syncope. He occasionally feels a vague chest discomfort described as a tightness. It is not related to exertion. It does not radiate. He states it feels similar to having a chest cold. No associated symptoms.  Current Outpatient Prescriptions  Medication Sig Dispense Refill  . loratadine (CLARITIN) 10 MG tablet Take 10 mg by mouth as needed.       . mesalamine (LIALDA) 1.2 G EC tablet Take by mouth. Take 2 tablets by mouth daily.       Marland Kitchen DISCONTD: naproxen (NAPROSYN) 500 MG tablet Take 500 mg by mouth 2 (two) times daily with a meal.        . DISCONTD: predniSONE (DELTASONE) 5 MG tablet Take 5 mg by mouth daily.          No Known Allergies  Past Medical History  Diagnosis Date  . Ulcerative colitis   . Hearing loss     Right ear w/ hearing aid (new one in 2008)  . OAB (overactive bladder)     or BPH?    Past Surgical History  Procedure Date  . Rhinoplasty 1997  . Right ear surgery for scar tissue removal 1996    History   Social History  . Marital Status: Married    Spouse Name: [name]    Number of Children: 2  . Years of Education: N/A   Occupational History  . Freelance Wrtier, Product/process development scientist, also works for [employer]    Social History Main Topics  . Smoking status: Never Smoker   . Smokeless tobacco: Not on file  . Alcohol Use: Yes     rare   . Drug Use: Not on file  . Sexually Active: Not on file   Other Topics Concern  . Not on file   Social History Narrative   Moved from [place] in 2008. Mom  lives with them.Regular exercise:  Works out 3 days a week.2 children12 and 7Daughter 12 is type I diabeticMother has platelet disorder (thrombocytosis)Maternal grand parents - DM II    Family History  Problem Relation Age of Onset  . Hyperlipidemia Mother   . Hypertension Mother     ROS: History of mild hematochezia associated with ulcerative colitis but no fevers or chills, productive cough, hemoptysis, dysphasia, odynophagia, melena, hematochezia, dysuria, hematuria, rash, seizure activity, orthopnea, PND, pedal edema, claudication. Remaining systems are negative.  Physical Exam: General:  Well developed/well nourished in NAD Skin warm/dry Patient not depressed No peripheral clubbing Back-normal HEENT-normal/normal eyelids Neck supple/normal carotid upstroke bilaterally; no bruits; no JVD; no thyromegaly chest - CTA/ normal expansion CV - RRR/normal S1 and S2; no murmurs, rubs or gallops;  PMI nondisplaced; no change with valsalva Abdomen -NT/ND, no HSM, no mass, + bowel sounds, no bruit 2+ femoral pulses, no bruits Ext-no edema, chords, 2+ DP Neuro-grossly nonfocal  ECG - 12/20/10 - Marked sinus bradycardia at a rate of 49. No ST changes.

## 2011-01-17 NOTE — Assessment & Plan Note (Signed)
Patient noted to have a heart rate of 49 on previous electrocardiogram. Otherwise his electrocardiogram was normal. I doubt this is contributing to fatigue. He is fit and exercises routinely which may be contributing. I will schedule an exercise treadmill to demonstrate chronotropic competence. If his heart rate increases normally with exercise I do not feel further cardiac evaluation necessary.

## 2011-01-17 NOTE — Telephone Encounter (Signed)
Please call patient and let him know that the pulse oximetry results showed that his oxygen drops overnight.  He needs a formal sleep study.  I will order and Myriam Jacobson will call him with the appointment.

## 2011-01-17 NOTE — Telephone Encounter (Signed)
Pt would like to know pulse oximetry results.

## 2011-01-17 NOTE — Patient Instructions (Signed)
Your physician has requested that you have an exercise tolerance test. For further information please visit [email or web address]. Please also follow instruction sheet, as given.   

## 2011-01-17 NOTE — Assessment & Plan Note (Signed)
Symptoms atypical. Exercise treadmill will be arranged.

## 2011-01-17 NOTE — Assessment & Plan Note (Signed)
Management per primary care. 

## 2011-01-18 NOTE — Telephone Encounter (Signed)
Addended by: Mervin Kung A on: 01/18/2011 03:53 PM   Modules accepted: Orders

## 2011-01-18 NOTE — Telephone Encounter (Signed)
Pt.notified

## 2011-01-24 ENCOUNTER — Encounter: Payer: Self-pay | Admitting: Cardiology

## 2011-02-01 ENCOUNTER — Encounter: Payer: Self-pay | Admitting: Emergency Medicine

## 2011-02-01 ENCOUNTER — Inpatient Hospital Stay (INDEPENDENT_AMBULATORY_CARE_PROVIDER_SITE_OTHER)
Admission: RE | Admit: 2011-02-01 | Discharge: 2011-02-01 | Disposition: A | Discharge status: Home / Self Care | Payer: BC Managed Care – PPO | Source: Ambulatory Visit | Attending: Emergency Medicine | Admitting: Emergency Medicine

## 2011-02-01 DIAGNOSIS — M79609 Pain in unspecified limb: Secondary | ICD-10-CM | POA: Insufficient documentation

## 2011-02-02 ENCOUNTER — Encounter: Payer: Self-pay | Admitting: Family Medicine

## 2011-02-02 ENCOUNTER — Ambulatory Visit (INDEPENDENT_AMBULATORY_CARE_PROVIDER_SITE_OTHER): Payer: BC Managed Care – PPO | Admitting: Family Medicine

## 2011-02-02 ENCOUNTER — Telehealth (INDEPENDENT_AMBULATORY_CARE_PROVIDER_SITE_OTHER): Payer: Self-pay | Admitting: *Deleted

## 2011-02-02 VITALS — BP 153/92 | HR 81 | Temp 98.1°F | Ht 72.0 in | Wt 195.0 lb

## 2011-02-02 DIAGNOSIS — M79609 Pain in unspecified limb: Secondary | ICD-10-CM

## 2011-02-02 DIAGNOSIS — M79604 Pain in right leg: Secondary | ICD-10-CM

## 2011-02-02 NOTE — Patient Instructions (Signed)
You have a calf strain/partial tear of your medial gastrocnemius muscle (tennis leg) Compression sleeve or ace wrap to help with swelling and pain when not exquisitely tender. Icing for 15 minutes at a time 3-4 times a day for first 2-3 days then switch to moist heat. Use crutches with touch-down weight bearing for next 7-10 days until follow-up. We will transition you to regular shoes with heel lifts on your orthotics at follow-up. Tylenol and/or aleve for pain. Start ankle range of motion exercises (up/down and alphabet exercises) to keep fluid and blood moving in calf to prevent blood clot. Follow up with me in 1 1/2 weeks for a recheck - anticipate starting you in physical therapy then. 

## 2011-02-05 ENCOUNTER — Encounter: Payer: BC Managed Care – PPO | Admitting: Physician Assistant

## 2011-02-05 ENCOUNTER — Encounter: Payer: Self-pay | Admitting: Family Medicine

## 2011-02-05 DIAGNOSIS — M79669 Pain in unspecified lower leg: Secondary | ICD-10-CM | POA: Insufficient documentation

## 2011-02-05 NOTE — Assessment & Plan Note (Signed)
Severe calf strain - Crutches as needed, ACE wrap for compression (too painful for compression sleeve currently), icing then switch to heat.  Start ROM exercises - stressed importance of these to prevent DVT.  Weight bear when tolerated.  F/u 7-10 days for recheck.

## 2011-02-05 NOTE — Progress Notes (Signed)
Subjective:    Patient ID: Benjamin Leonard, male    DOB: [birth year], 45 y.o.   MRN: [id number]  PCP: Dr Cathey Endow  HPI 45 yo M here for right calf injury.  Patient reports having 2 prior calf injuries in past 10 years, occurring similar to this most recent one. Left calf strain occurred several years ago - underwent PT following recovery and completely improved without problems. 3-4 years ago straing right medial calf - couldn't bear weight following this - was informed to 'stay off it' with crutches but did not undergo rehab - has never felt completely back to normal with intermittent pain, weakness since then. Reports past several months has especially felt weak and intermittently bothers him. On 6/30 he was at [facility] coming down a slide when he felt a twinge and pull medial right foot.  Was initially limping but this seemed to improve. Then yesterday 7/5 was at [facility] again coming down slide, felt sharp pull/twinge medial right calf. Has been icing, using crutches. Noticed bruising medial right foot - unsure which injury this was from (calf or foot twinge). Not taking any medications. Went to Urgent Care 7/5 and referred here. No chest pain, shortness of breath, other pain. Patient noted he had surgery for plantar fasciitis of left foot recently as well.  Past Medical History  Diagnosis Date  . Ulcerative colitis   . Hearing loss     Right ear w/ hearing aid (new one in 2008)  . OAB (overactive bladder)     or BPH?    Current Outpatient Prescriptions on File Prior to Visit  Medication Sig Dispense Refill  . loratadine (CLARITIN) 10 MG tablet Take 10 mg by mouth as needed.       . mesalamine (LIALDA) 1.2 G EC tablet Take by mouth. Take 2 tablets by mouth daily.         Past Surgical History  Procedure Date  . Rhinoplasty 1997  . Right ear surgery for scar tissue removal 1996    No Known Allergies  History   Social History  . Marital Status: Married    Spouse  Name: [name]    Number of Children: 2  . Years of Education: N/A   Occupational History  . Freelance Wrtier, Product/process development scientist, also works for [employer]    Social History Main Topics  . Smoking status: Never Smoker   . Smokeless tobacco: Not on file  . Alcohol Use: Yes     rare   . Drug Use: Not on file  . Sexually Active: Not on file   Other Topics Concern  . Not on file   Social History Narrative   Moved from [place] in 2008. Mom lives with them.Regular exercise:  Works out 3 days a week.2 children12 and 7Daughter 12 is type I diabeticMother has platelet disorder (thrombocytosis)Maternal grand parents - DM II    Family History  Problem Relation Age of Onset  . Hyperlipidemia Mother   . Hypertension Mother   . Hypertension Father   . Sudden death Father   . Sudden death Maternal Grandmother   . Diabetes Maternal Grandmother   . Diabetes Maternal Grandfather   . Heart attack Neg Hx     BP 153/92  Pulse 81  Temp(Src) 98.1 F (36.7 C) (Oral)  Ht 6' (1.829 m)  Wt 195 lb (88.451 kg)  BMI 26.45 kg/m2  Review of Systems See HPI above.    Objective:   Physical Exam Gen: NAD, appears uncomfortable.  R lower extremity: Mild swelling medial calf compared to left.  Small amount of bruising medial foot.  No other bruising, deformity.  No erythema or warmth.  No palpable cords TTP focally in superficial aspect medial gastroc.  No TTP achilles, lateral gastroc, near insertion of calf muscles post knee.  TTP plantar foot distal to calcaneus.  No other TTP. Unable to do calf raise.  Pain medial gastroc with dorsiflexion and plantar flexion. Strength 4/5 with plantarflexion.  5-/5 int rotation.  5/5 with dorsiflexion and ext rotation. Negative ant/post drawers.  Negative thompsons.    MSK u/s:  Minimal amount of fluid surrounding medial gastroc at level of pain.  Achilles intact.  Post tibialis tendon intact on long and transverse views though with mild target sign.    Assessment & Plan:  1.  Severe calf strain - Crutches as needed, ACE wrap for compression (too painful for compression sleeve currently), icing then switch to heat.  Start ROM exercises - stressed importance of these to prevent DVT.  Weight bear when tolerated.  F/u 7-10 days for recheck.

## 2011-02-08 ENCOUNTER — Encounter: Payer: Self-pay | Admitting: Family Medicine

## 2011-02-08 ENCOUNTER — Ambulatory Visit (INDEPENDENT_AMBULATORY_CARE_PROVIDER_SITE_OTHER): Payer: BC Managed Care – PPO | Admitting: Family Medicine

## 2011-02-08 ENCOUNTER — Ambulatory Visit (HOSPITAL_BASED_OUTPATIENT_CLINIC_OR_DEPARTMENT_OTHER)
Admission: RE | Admit: 2011-02-08 | Discharge: 2011-02-08 | Disposition: A | Discharge status: Home / Self Care | Payer: BC Managed Care – PPO | Source: Ambulatory Visit | Attending: Family Medicine | Admitting: Family Medicine

## 2011-02-08 VITALS — BP 123/80 | HR 55 | Temp 97.8°F | Ht 72.0 in | Wt 195.0 lb

## 2011-02-08 DIAGNOSIS — M79669 Pain in unspecified lower leg: Secondary | ICD-10-CM

## 2011-02-08 DIAGNOSIS — M79609 Pain in unspecified limb: Secondary | ICD-10-CM | POA: Insufficient documentation

## 2011-02-08 DIAGNOSIS — M79604 Pain in right leg: Secondary | ICD-10-CM

## 2011-02-08 DIAGNOSIS — R252 Cramp and spasm: Secondary | ICD-10-CM

## 2011-02-08 MED ORDER — OXYCODONE-ACETAMINOPHEN 5-325 MG PO TABS: 1.0000 | ORAL_TABLET | ORAL | Status: AC | PRN

## 2011-02-08 NOTE — Progress Notes (Signed)
Subjective:    Patient ID: Benjamin Leonard, male    DOB: 27-Nov-1965, 45 y.o.   MRN: [id number]  PCP: Dr Cathey Endow  HPI  45 yo M here for 6 day f/u right calf injury.  02/02/11: Patient reports having 2 prior calf injuries in past 10 years, occurring similar to this most recent one. Left calf strain occurred several years ago - underwent PT following recovery and completely improved without problems. 3-4 years ago straing right medial calf - couldn't bear weight following this - was informed to 'stay off it' with crutches but did not undergo rehab - has never felt completely back to normal with intermittent pain, weakness since then. Reports past several months has especially felt weak and intermittently bothers him. On 6/30 he was at [facility] coming down a slide when he felt a twinge and pull medial right foot.  Was initially limping but this seemed to improve. Then yesterday 7/5 was at [facility] again coming down slide, felt sharp pull/twinge medial right calf. Has been icing, using crutches. Noticed bruising medial right foot - unsure which injury this was from (calf or foot twinge). Not taking any medications. Went to Urgent Care 7/5 and referred here. No chest pain, shortness of breath, other pain. Patient noted he had surgery for plantar fasciitis of left foot recently as well.  Today: Patient called today stating that pain has been worsening instead of improving, especially in the mornings. Still has swelling but no redness, no pain above the level of the knee. Is using ace wrap, icing and doing moist heat, elevating at nighttime. Nothing seems to be helping. Using crutches for ambulation. No chest pain, shortness of breath, syncopal episodes.  Past Medical History  Diagnosis Date  . Ulcerative colitis   . Hearing loss     Right ear w/ hearing aid (new one in 2008)  . OAB (overactive bladder)     or BPH?    Current Outpatient Prescriptions on File Prior to Visit    Medication Sig Dispense Refill  . loratadine (CLARITIN) 10 MG tablet Take 10 mg by mouth as needed.       . mesalamine (LIALDA) 1.2 G EC tablet Take by mouth. Take 2 tablets by mouth daily.         Past Surgical History  Procedure Date  . Rhinoplasty 1997  . Right ear surgery for scar tissue removal 1996    No Known Allergies  History   Social History  . Marital Status: Married    Spouse Name: [name]    Number of Children: 2  . Years of Education: N/A   Occupational History  . Freelance Wrtier, Product/process development scientist, also works for [employer]    Social History Main Topics  . Smoking status: Never Smoker   . Smokeless tobacco: Not on file  . Alcohol Use: Yes     rare   . Drug Use: Not on file  . Sexually Active: Not on file   Other Topics Concern  . Not on file   Social History Narrative   Moved from [place] in 2008. Mom lives with them.Regular exercise:  Works out 3 days a week.2 children12 and 7Daughter 12 is type I diabeticMother has platelet disorder (thrombocytosis)Maternal grand parents - DM II    Family History  Problem Relation Age of Onset  . Hyperlipidemia Mother   . Hypertension Mother   . Hypertension Father   . Sudden death Father   . Sudden death Maternal Grandmother   .  Diabetes Maternal Grandmother   . Diabetes Maternal Grandfather   . Heart attack Neg Hx     BP 123/80  Pulse 55  Temp(Src) 97.8 F (36.6 C) (Oral)  Ht 6' (1.829 m)  Wt 195 lb (88.451 kg)  BMI 26.45 kg/m2  Review of Systems  See HPI above.    Objective:   Physical Exam  Gen: NAD, appears uncomfortable.  R lower extremity: Moderate swelling medial calf compared to left.  Bruising medial foot resolved.  No other bruising, deformity.  No erythema or warmth.  No palpable cords TTP focally in superficial aspect medial gastroc.  No TTP achilles, lateral gastroc, near insertion of calf muscles post knee.  No other TTP. Unable to do calf raise.  Pain medial gastroc with dorsiflexion and plantar  flexion. Strength 4/5 with plantarflexion.  5/5 int rotation.  5/5 with dorsiflexion and ext rotation. Negative ant/post drawers.  Negative thompsons.     Assessment & Plan:  1. Severe calf strain - Given severity of pain and not improving over past week, worsening pain and swelling, will proceed with doppler u/s to rule out DVT.  Percocet as needed for severe pain.  If negative, will start him in physical therapy.  Otherwise continue crutches, ace wrap, heat to help with muscle spasms and resorption of fluid/blood.  Will see him in f/u in 2 weeks if u/s negative.

## 2011-02-08 NOTE — Patient Instructions (Signed)
You have a calf strain/partial tear of your medial gastrocnemius muscle (tennis leg) Compression sleeve or ace wrap to help with swelling and pain when not exquisitely tender. Icing for 15 minutes at a time 3-4 times a day for first 2-3 days then switch to moist heat. Use crutches with touch-down weight bearing for next 7-10 days until follow-up. We will transition you to regular shoes with heel lifts on your orthotics at follow-up. Tylenol and/or aleve for pain. Start ankle range of motion exercises (up/down and alphabet exercises) to keep fluid and blood moving in calf to prevent blood clot. Follow up with me in 1 1/2 weeks for a recheck - anticipate starting you in physical therapy then.

## 2011-02-08 NOTE — Assessment & Plan Note (Signed)
Severe calf strain - Given severity of pain and not improving over past week, worsening pain and swelling, will proceed with doppler u/s to rule out DVT.  Percocet as needed for severe pain.  If negative, will start him in physical therapy.  Otherwise continue crutches, ace wrap, heat to help with muscle spasms and resorption of fluid/blood.  Will see him in f/u in 2 weeks if u/s negative.

## 2011-02-09 ENCOUNTER — Ambulatory Visit: Payer: BC Managed Care – PPO | Attending: Family Medicine | Admitting: Physical Therapy

## 2011-02-09 DIAGNOSIS — M25669 Stiffness of unspecified knee, not elsewhere classified: Secondary | ICD-10-CM | POA: Insufficient documentation

## 2011-02-09 DIAGNOSIS — R262 Difficulty in walking, not elsewhere classified: Secondary | ICD-10-CM | POA: Insufficient documentation

## 2011-02-09 DIAGNOSIS — M25569 Pain in unspecified knee: Secondary | ICD-10-CM | POA: Insufficient documentation

## 2011-02-09 DIAGNOSIS — IMO0001 Reserved for inherently not codable concepts without codable children: Secondary | ICD-10-CM | POA: Insufficient documentation

## 2011-02-12 ENCOUNTER — Ambulatory Visit: Payer: BC Managed Care – PPO | Admitting: Family Medicine

## 2011-02-12 ENCOUNTER — Ambulatory Visit (HOSPITAL_BASED_OUTPATIENT_CLINIC_OR_DEPARTMENT_OTHER): Payer: BC Managed Care – PPO | Attending: Internal Medicine

## 2011-02-12 ENCOUNTER — Ambulatory Visit: Payer: BC Managed Care – PPO

## 2011-02-12 DIAGNOSIS — R400 Somnolence: Secondary | ICD-10-CM

## 2011-02-12 DIAGNOSIS — R0902 Hypoxemia: Secondary | ICD-10-CM

## 2011-02-12 DIAGNOSIS — G4710 Hypersomnia, unspecified: Secondary | ICD-10-CM | POA: Insufficient documentation

## 2011-02-12 DIAGNOSIS — G4734 Idiopathic sleep related nonobstructive alveolar hypoventilation: Secondary | ICD-10-CM | POA: Insufficient documentation

## 2011-02-15 DIAGNOSIS — G4710 Hypersomnia, unspecified: Secondary | ICD-10-CM

## 2011-02-15 DIAGNOSIS — G4734 Idiopathic sleep related nonobstructive alveolar hypoventilation: Secondary | ICD-10-CM

## 2011-02-15 NOTE — Procedures (Signed)
NAMESEVERIN, BOU               ACCOUNT NO.:  [id number]  MEDICAL RECORD NO.:  [id number]          PATIENT TYPE:  OUT  LOCATION:  SLEEP CENTER                 FACILITY:  [facility]  PHYSICIAN:  Oretha Milch, MD      DATE OF BIRTH:  31-Oct-1965  DATE OF STUDY:  02/12/2011                           NOCTURNAL POLYSOMNOGRAM  REFERRING PHYSICIAN:  Seymour Bars, D.O.  INDICATION FOR STUDY:  Mr. Thain is a 45 year old gentleman with daytime somnolence.  He underwent an overnight pulse oximetry, was noted to desat as low as 82%.  He spent 102 minutes with a saturation less than 89% and an overnight polysomnogram was scheduled.  There is a history of leg jerks, trouble concentrating, and some sleepiness during the day. At the time of this study, he weighed 195 pounds with a height of 6 feet, BMI of 26, neck size 14.5 inches.  EPWORTH SLEEPINESS SCORE:  6.  MEDICATIONS:  Include multivitamin and fish oil.  This nocturnal polysomnogram was performed with a sleep technologist in attendance.  EEG, EOG, EMG, EKG, respiratory parameters were recorded. Sleep stages arousals, limb movements, and respiratory data were scored according to the criteria laid out by the American Academy of Sleep Medicine.  SLEEP ARCHITECTURE:  Lights out was at 11:09 p.m., lights on was at 5:05 a.m.  Total sleep time was only 112 minutes with a sleep period time of 234 minutes, and a sleep efficiency of 32%.  Sleep latency was 62 minutes.  Latency to REM sleep was 129 minutes and wake after sleep onset was 181 minutes.  Sleep stages as the percentage of total sleep time was N1 15%, N2 51%, N3 24%, and REM sleep 9% (10 minutes).  Supine sleep accounted for 44 minutes.  REM sleep was noted around 2:30 a.m. He had prolonged periods of awakening.  AROUSAL DATA:  There were 56 arousals with an arousal index of 30 events per hour, of these 48 were spontaneous and the rest were associated with respiratory  events.  RESPIRATORY DATA:  There were a total of 0 obstructive apneas, 0 central apneas, 2 mixed apneas, 6 hypopneas, and an apnea/hypopnea index of 4 events per hour.  32 respiratory effort related arousals were noted with an RDI of 21 events per hour.  The longest hypopnea was 17 seconds and the longest apnea was 15 seconds.  OXYGEN DATA:  The desaturation index was 9 events per hour.  The lowest desaturation was 87% during non-REM sleep.  He spent 0.1 minutes with a saturation less than 88%.  Saturations remained in the low 90s for most of the time during sleep.  CARDIAC DATA:  No arrhythmias were noted.  Low heart rate was 40 beats per minute.  The high heart rate recorded was an artifact.  DISCUSSION:  Poor sleep efficiency with long periods of wakening.  He did not meet criteria for intervention.  MOVEMENT-PARASOMNIA:  No significant limb movements were noted.  IMPRESSIONS-RECOMMENDATIONS: 1. Mild sleep disordered breathing with increased predominant     respiratory effort related arousals, consistent with increased     upper airway resistance. 2. Mild sleep related hypoxia with saturations in the low 90s, does  not require oxygen intervention. 3. No evidence of cardiac arrhythmias, limb movements, or behavioral     disturbance during sleep.  RECOMMENDATIONS: 1. The only treatment warranted for this degree of sleep disordered     breathing is perhaps 5-10-pound weight loss. 2. Oxygen intervention is not warranted.  These correlate with a     history of cardiopulmonary disease.  He did not have significant     desaturations as noted on the nocturnal oximetry study. 3. Rules of sleep hygiene can be discussed.     Oretha Milch, MD Electronically Signed    RVA/MEDQ  D:  02/15/2011 11:06:45  T:  02/15/2011 22:33:57  Job:  [id number]  cc:   Sandford Craze, NP Fax: [phone]

## 2011-02-16 ENCOUNTER — Ambulatory Visit: Payer: BC Managed Care – PPO | Admitting: Physical Therapy

## 2011-02-19 ENCOUNTER — Ambulatory Visit: Payer: BC Managed Care – PPO | Admitting: Physical Therapy

## 2011-02-21 ENCOUNTER — Encounter: Payer: Self-pay | Admitting: Family Medicine

## 2011-02-21 ENCOUNTER — Ambulatory Visit (INDEPENDENT_AMBULATORY_CARE_PROVIDER_SITE_OTHER): Payer: BC Managed Care – PPO | Admitting: Family Medicine

## 2011-02-21 VITALS — BP 119/81 | HR 74 | Temp 97.8°F | Ht 72.0 in | Wt 195.0 lb

## 2011-02-21 DIAGNOSIS — M79604 Pain in right leg: Secondary | ICD-10-CM

## 2011-02-21 DIAGNOSIS — M79609 Pain in unspecified limb: Secondary | ICD-10-CM

## 2011-02-21 NOTE — Patient Instructions (Signed)
You have a calf strain/partial tear of your medial gastrocnemius muscle (tennis leg). The area on the inside of your foot looks normal on ultrasound (heel bone, medial tendons, plantar fascia, achilles tendon) and you have normal function of these tendons suggesting the initial injury here was related to rupture of scar tissue causing bruising, swelling. Compression sleeve or ace wrap to help with swelling and pain when not exquisitely tender. Icing after PT and when really sore 15 minutes at a time. Use just 1 crutch in opposite arm to help with ambulation. Use your orthotics with the built in heel lift. Tylenol and/or aleve for pain. Follow up with me in 3 weeks for a recheck. If you worsen instead of improve over the next 3 weeks, get numbness lower leg instead of just the foot, call me. Next step would likely be an MRI of your foot if you are not improving as I expect in this area.

## 2011-02-22 ENCOUNTER — Encounter: Payer: Self-pay | Admitting: Family Medicine

## 2011-02-22 ENCOUNTER — Ambulatory Visit: Payer: BC Managed Care – PPO | Admitting: Physical Therapy

## 2011-02-22 NOTE — Progress Notes (Signed)
Subjective:    Patient ID: Benjamin Leonard, male    DOB: 1966/04/14, 45 y.o.   MRN: [id number]  PCP: Dr Cathey Endow  HPI  45 yo M here for 6 day f/u right calf injury.  02/02/11: Patient reports having 2 prior calf injuries in past 10 years, occurring similar to this most recent one. Left calf strain occurred several years ago - underwent PT following recovery and completely improved without problems. 3-4 years ago straing right medial calf - couldn't bear weight following this - was informed to 'stay off it' with crutches but did not undergo rehab - has never felt completely back to normal with intermittent pain, weakness since then. Reports past several months has especially felt weak and intermittently bothers him. On 6/30 he was at [facility] coming down a slide when he felt a twinge and pull medial right foot.  Was initially limping but this seemed to improve. Then yesterday 7/5 was at [facility] again coming down slide, felt sharp pull/twinge medial right calf. Has been icing, using crutches. Noticed bruising medial right foot - unsure which injury this was from (calf or foot twinge). Not taking any medications. Went to Urgent Care 7/5 and referred here. No chest pain, shortness of breath, other pain. Patient noted he had surgery for plantar fasciitis of left foot recently as well.  02/08/11: Patient called today stating that pain has been worsening instead of improving, especially in the mornings. Still has swelling but no redness, no pain above the level of the knee. Is using ace wrap, icing and doing moist heat, elevating at nighttime. Nothing seems to be helping. Using crutches for ambulation. No chest pain, shortness of breath, syncopal episodes.  02/21/11: Patient reports calf pain has improved though slowly. Had the doppler u/s last OV that was negative for a DVT. Has gone to 3 PT visits and is making progress. Calf swelling is down but swelling has increased at ankle and  foot. No longer with bruising. He is using ace wrap, icing, wearing his shoes with orthotics in them. Still using crutches to help with ambulation. Reports in PT on medial heel toward plantar region the physical therapist felt a painful bump with rice crispy-like feeling.  This wasn't up near the ankle or plantar surface.  He has had pain in this area. Movement has improved but calf feels tight when he dorsiflexes foot. Some tingling/burning medial foot that will go to top of foot. No lower leg numbness (only this part of foot with tingling).  Past Medical History  Diagnosis Date  . Ulcerative colitis   . Hearing loss     Right ear w/ hearing aid (new one in 2008)  . OAB (overactive bladder)     or BPH?    Current Outpatient Prescriptions on File Prior to Visit  Medication Sig Dispense Refill  . loratadine (CLARITIN) 10 MG tablet Take 10 mg by mouth as needed.       . mesalamine (LIALDA) 1.2 G EC tablet Take by mouth. Take 2 tablets by mouth daily.         Past Surgical History  Procedure Date  . Rhinoplasty 1997  . Right ear surgery for scar tissue removal 1996    No Known Allergies  History   Social History  . Marital Status: Married    Spouse Name: [name]    Number of Children: 2  . Years of Education: N/A   Occupational History  . Freelance Wrtier, Product/process development scientist, also works for [employer]  Social History Main Topics  . Smoking status: Never Smoker   . Smokeless tobacco: Not on file  . Alcohol Use: Yes     rare   . Drug Use: Not on file  . Sexually Active: Not on file   Other Topics Concern  . Not on file   Social History Narrative   Moved from [place] in 2008. Mom lives with them.Regular exercise:  Works out 3 days a week.2 children12 and 7Daughter 12 is type I diabeticMother has platelet disorder (thrombocytosis)Maternal grand parents - DM II    Family History  Problem Relation Age of Onset  . Hyperlipidemia Mother   . Hypertension Mother   . Hypertension Father    . Sudden death Father   . Sudden death Maternal Grandmother   . Diabetes Maternal Grandmother   . Diabetes Maternal Grandfather   . Heart attack Neg Hx     BP 119/81  Pulse 74  Temp(Src) 97.8 F (36.6 C) (Oral)  Ht 6' (1.829 m)  Wt 195 lb (88.451 kg)  BMI 26.45 kg/m2  Review of Systems  See HPI above.    Objective:   Physical Exam  Gen: NAD.  R lower extremity: Now mild swelling medial calf compared to left.  1-2+ pitting edema lower tibia.  Bruising medial foot resolved.  No other bruising, deformity.  No erythema or warmth.  No palpable cords TTP focally in superficial aspect medial gastroc though improved.  No TTP achilles, lateral gastroc, near insertion of calf muscles post knee.  Mild TTP medial calcaneus and at post tib tendon as crosses ankle.  Area of pain that was felt at PT is closer to plantar heel than tendons across medial ankle though. Unable to do calf raise.  Pain medial gastroc with dorsiflexion and plantar flexion.   Strength 4/5 with plantarflexion.  5/5 int rotation.  5/5 with dorsiflexion and ext rotation. Negative ant/post drawers.  Negative thompsons. Negative calcaneal squeeze.     MSK u/s:  Post tib, flexor digitorum tendons appear intact throughout course.  No bony, soft tissue abnormalities at location of area he notes was found during PT.  PF appears mildly thickened.  Otherwise normal ultrasound.  Assessment & Plan:  1. Severe calf strain - Doppler u/s negative for DVT.  Continue with PT.  Reassured him regarding location of his pain and normal ultrasound.  He did have a surgical release of plantar fascia and may have ruptured scar tissue causing swelling, bruising, and increased pain.  Otherwise only calcaneus in location of that pain.  Medial ankle tendons intact throughout course as well.  Wean off crutches.  Continue ace wrap, ice/heat (whichever most comfortable to him at this point), elevation.  See instructions for further.  If not improving  after 3 weeks, increased weakness of foot, numbness lower leg, advised to call us.  We can consider MRI of foot.  Doubt any type of compartment syndrome based on his history and exam.

## 2011-02-22 NOTE — Assessment & Plan Note (Signed)
Severe calf strain - Doppler u/s negative for DVT.  Continue with PT.  Reassured him regarding location of his pain and normal ultrasound.  He did have a surgical release of plantar fascia and may have ruptured scar tissue causing swelling, bruising, and increased pain.  Otherwise only calcaneus in location of that pain.  Medial ankle tendons intact throughout course as well.  Wean off crutches.  Continue ace wrap, ice/heat (whichever most comfortable to him at this point), elevation.  See instructions for further.  If not improving after 3 weeks, increased weakness of foot, numbness lower leg, advised to call us.  We can consider MRI of foot.  Doubt any type of compartment syndrome based on his history and exam.

## 2011-02-26 ENCOUNTER — Ambulatory Visit: Payer: BC Managed Care – PPO | Admitting: Physical Therapy

## 2011-02-26 ENCOUNTER — Telehealth: Payer: Self-pay | Admitting: *Deleted

## 2011-02-26 NOTE — Telephone Encounter (Signed)
Patient called and left voice message requesting the results of his sleep study he had done 2 weeks ago.

## 2011-02-26 NOTE — Telephone Encounter (Signed)
Call placed to patient [phone] he was informed per Dr Rodena Medin instructions

## 2011-02-26 NOTE — Telephone Encounter (Signed)
Mild sleep problems only. No osa. Specialist primarily recommended wt loss

## 2011-03-02 ENCOUNTER — Ambulatory Visit: Payer: BC Managed Care – PPO | Attending: Family Medicine | Admitting: Physical Therapy

## 2011-03-02 DIAGNOSIS — M25569 Pain in unspecified knee: Secondary | ICD-10-CM | POA: Insufficient documentation

## 2011-03-02 DIAGNOSIS — IMO0001 Reserved for inherently not codable concepts without codable children: Secondary | ICD-10-CM | POA: Insufficient documentation

## 2011-03-02 DIAGNOSIS — M25669 Stiffness of unspecified knee, not elsewhere classified: Secondary | ICD-10-CM | POA: Insufficient documentation

## 2011-03-02 DIAGNOSIS — R262 Difficulty in walking, not elsewhere classified: Secondary | ICD-10-CM | POA: Insufficient documentation

## 2011-03-05 ENCOUNTER — Ambulatory Visit: Payer: BC Managed Care – PPO | Admitting: Physical Therapy

## 2011-03-09 ENCOUNTER — Ambulatory Visit: Payer: BC Managed Care – PPO | Admitting: Physical Therapy

## 2011-03-13 ENCOUNTER — Ambulatory Visit: Payer: BC Managed Care – PPO | Admitting: Physical Therapy

## 2011-03-14 ENCOUNTER — Ambulatory Visit (INDEPENDENT_AMBULATORY_CARE_PROVIDER_SITE_OTHER): Payer: BC Managed Care – PPO | Admitting: Family Medicine

## 2011-03-14 ENCOUNTER — Encounter: Payer: Self-pay | Admitting: Family Medicine

## 2011-03-14 DIAGNOSIS — M79609 Pain in unspecified limb: Secondary | ICD-10-CM

## 2011-03-14 DIAGNOSIS — M722 Plantar fascial fibromatosis: Secondary | ICD-10-CM

## 2011-03-14 DIAGNOSIS — M79604 Pain in right leg: Secondary | ICD-10-CM

## 2011-03-14 NOTE — Patient Instructions (Signed)
Keep up the good work with your physical therapy and home exercises. You are getting the plantar fasciitis back. Take tylenol or aleve as needed for pain  Plantar fascia stretch for 20-30 seconds (do 3 of these) in morning Lowering/raise on a step exercises 3 x 15 once or twice a day - this is very important for long term recovery. Can add heel walks, toe walks forward and backward as well Ice bucket 10-15 minutes at end of day Avoid flat shoes/barefoot walking as much as possible. Arch straps have been shown to help with pain. Heel lifts also help with pain by avoiding fully stretching the plantar fascia except when doing home exercises. Orthotics with heel lift may be helpful. Steroid injection is a consideration for short term pain relief if you are struggling. Return to full duty in 2 weeks - will give you additional time for strengthening.  Make sure you're wearing inserts when you do. I would avoid P90x plyometrics for 6 more weeks - suggest you feel 100% before trying this. Follow up with me in 1 month - if these inserts work I can either show you how to order these (typically hold up for 3-6 months) or we can make you custom orthotics.

## 2011-03-15 ENCOUNTER — Encounter: Payer: Self-pay | Admitting: Family Medicine

## 2011-03-15 DIAGNOSIS — M722 Plantar fascial fibromatosis: Secondary | ICD-10-CM | POA: Insufficient documentation

## 2011-03-15 NOTE — Assessment & Plan Note (Signed)
Severe calf strain - Improving and much better compared to last visit.  Continue with PT, home exercises. Avoid plyometrics.  Most of pain now related to recurrence of plantar fasciitis.  Wean off crutches.  Continue ace wrap, ice/heat (whichever most comfortable to him at this point), elevation.

## 2011-03-15 NOTE — Assessment & Plan Note (Signed)
s/p release with pain worsening again in plantar fascia.  Shown stretches, exercises and handout provided.  Avoid flat shoes/barefoot walking.  Arch straps, heel lifts, sports insoles with the heel lifts and scaphoid pads provided (his orthotics have good arch support but are hard and older).  Do not think cortisone injection will be much benefit - his pain is throughout entire fascia.  Return to full duty in 2 weeks (mostly due to calf strain to optimize his strengthening, pt, rehab).  F/u in 1 month.

## 2011-03-15 NOTE — Progress Notes (Signed)
Subjective:    Patient ID: Benjamin Leonard, male    DOB: 27-May-1966, 45 y.o.   MRN: [id number]  PCP: Dr Cathey Endow  HPI  45 yo M here for 6 day f/u right calf injury.  02/02/11: Patient reports having 2 prior calf injuries in past 10 years, occurring similar to this most recent one. Left calf strain occurred several years ago - underwent PT following recovery and completely improved without problems. 3-4 years ago straing right medial calf - couldn't bear weight following this - was informed to 'stay off it' with crutches but did not undergo rehab - has never felt completely back to normal with intermittent pain, weakness since then. Reports past several months has especially felt weak and intermittently bothers him. On 6/30 he was at [facility] coming down a slide when he felt a twinge and pull medial right foot.  Was initially limping but this seemed to improve. Then yesterday 7/5 was at [facility] again coming down slide, felt sharp pull/twinge medial right calf. Has been icing, using crutches. Noticed bruising medial right foot - unsure which injury this was from (calf or foot twinge). Not taking any medications. Went to Urgent Care 7/5 and referred here. No chest pain, shortness of breath, other pain. Patient noted he had surgery for plantar fasciitis of left foot recently as well.  02/08/11: Patient called today stating that pain has been worsening instead of improving, especially in the mornings. Still has swelling but no redness, no pain above the level of the knee. Is using ace wrap, icing and doing moist heat, elevating at nighttime. Nothing seems to be helping. Using crutches for ambulation. No chest pain, shortness of breath, syncopal episodes.  02/21/11: Patient reports calf pain has improved though slowly. Had the doppler u/s last OV that was negative for a DVT. Has gone to 3 PT visits and is making progress. Calf swelling is down but swelling has increased at ankle and  foot. No longer with bruising. He is using ace wrap, icing, wearing his shoes with orthotics in them. Still using crutches to help with ambulation. Reports in PT on medial heel toward plantar region the physical therapist felt a painful bump with rice crispy-like feeling.  This wasn't up near the ankle or plantar surface.  He has had pain in this area. Movement has improved but calf feels tight when he dorsiflexes foot. Some tingling/burning medial foot that will go to top of foot. No lower leg numbness (only this part of foot with tingling).  8/15: Patient's calf much improved, now a 2/10 with minimal swelling. Has been doing PT. Discontinued use of crutches. Using ace wrap, ice/heat as needed. Not taking any medicines right now. Foot pain medial improved but now has pain on plantar aspect of foot in plantar fascia, similar to plantar fasciitis pain he had prior to surgery. Not doing any exercises/stretches for plantar fascia. Started back especially past week when he was standing on concrete for 3 hours when working for [employer].  Past Medical History  Diagnosis Date  . Ulcerative colitis   . Hearing loss     Right ear w/ hearing aid (new one in 2008)  . OAB (overactive bladder)     or BPH?    Current Outpatient Prescriptions on File Prior to Visit  Medication Sig Dispense Refill  . loratadine (CLARITIN) 10 MG tablet Take 10 mg by mouth as needed.       . mesalamine (LIALDA) 1.2 G EC tablet Take by mouth. Take  2 tablets by mouth daily.         Past Surgical History  Procedure Date  . Rhinoplasty 1997  . Right ear surgery for scar tissue removal 1996    No Known Allergies  History   Social History  . Marital Status: Married    Spouse Name: [name]    Number of Children: 2  . Years of Education: N/A   Occupational History  . Freelance Wrtier, Product/process development scientist, also works for [employer]    Social History Main Topics  . Smoking status: Never Smoker   . Smokeless tobacco: Not on file   . Alcohol Use: Yes     rare   . Drug Use: Not on file  . Sexually Active: Not on file   Other Topics Concern  . Not on file   Social History Narrative   Moved from [place] in 2008. Mom lives with them.Regular exercise:  Works out 3 days a week.2 children12 and 7Daughter 12 is type I diabeticMother has platelet disorder (thrombocytosis)Maternal grand parents - DM II    Family History  Problem Relation Age of Onset  . Hyperlipidemia Mother   . Hypertension Mother   . Hypertension Father   . Sudden death Father   . Sudden death Maternal Grandmother   . Diabetes Maternal Grandmother   . Diabetes Maternal Grandfather   . Heart attack Neg Hx     BP 128/82  Pulse 75  Temp(Src) 97.9 F (36.6 C) (Oral)  Ht 6' (1.829 m)  Wt 195 lb (88.451 kg)  BMI 26.45 kg/m2  Review of Systems  See HPI above.    Objective:   Physical Exam  Gen: NAD.  R lower extremity: No swelling medial calf.  No edema lower tibia.  No other bruising, deformity.  No erythema or warmth.  No palpable cords Very mild TTP focally in medial gastroc, much improved.  No TTP achilles, lateral gastroc, near insertion of calf muscles post knee.   TTP throughout plantar fascia including anterior plantar area of calcaneus. No longer with medial calcaneus, PT tenderness.  Now able to do calf raise.  Pain medial gastroc with dorsiflexion and plantar flexion.   Strength 4/5 with plantarflexion.  5/5 int rotation.  5/5 with dorsiflexion and ext rotation. Negative ant/post drawers.  Negative thompsons. Negative calcaneal squeeze. Mild overpronation left arch, moderate right.     Assessment & Plan:  1. Severe calf strain - Improving and much better compared to last visit.  Continue with PT, home exercises. Avoid plyometrics.  Most of pain now related to recurrence of plantar fasciitis.  Wean off crutches.  Continue ace wrap, ice/heat (whichever most comfortable to him at this point), elevation.  2. Plantar fasciitis - s/p  release with pain worsening again in plantar fascia.  Shown stretches, exercises and handout provided.  Avoid flat shoes/barefoot walking.  Arch straps, heel lifts, sports insoles with the heel lifts and scaphoid pads provided (his orthotics have good arch support but are hard and older).  Do not think cortisone injection will be much benefit - his pain is throughout entire fascia.  Return to full duty in 2 weeks (mostly due to calf strain to optimize his strengthening, pt, rehab).  F/u in 1 month.

## 2011-04-17 ENCOUNTER — Ambulatory Visit: Payer: BC Managed Care – PPO | Admitting: Family Medicine

## 2011-05-23 ENCOUNTER — Ambulatory Visit (INDEPENDENT_AMBULATORY_CARE_PROVIDER_SITE_OTHER): Payer: BC Managed Care – PPO | Admitting: Family Medicine

## 2011-05-23 ENCOUNTER — Encounter: Payer: Self-pay | Admitting: Family Medicine

## 2011-05-23 ENCOUNTER — Ambulatory Visit (HOSPITAL_BASED_OUTPATIENT_CLINIC_OR_DEPARTMENT_OTHER)
Admission: RE | Admit: 2011-05-23 | Discharge: 2011-05-23 | Disposition: A | Discharge status: Home / Self Care | Payer: BC Managed Care – PPO | Source: Ambulatory Visit | Attending: Family Medicine | Admitting: Family Medicine

## 2011-05-23 VITALS — BP 130/88 | HR 53 | Temp 97.7°F | Ht 72.0 in | Wt 189.0 lb

## 2011-05-23 DIAGNOSIS — M25522 Pain in left elbow: Secondary | ICD-10-CM | POA: Insufficient documentation

## 2011-05-23 DIAGNOSIS — M25529 Pain in unspecified elbow: Secondary | ICD-10-CM

## 2011-05-23 DIAGNOSIS — X58XXXA Exposure to other specified factors, initial encounter: Secondary | ICD-10-CM

## 2011-05-23 NOTE — Patient Instructions (Signed)
Your left elbow pain is concerning for a loose body or other abnormality within the elbow joint itself. The catching, popping, injury you sustained that isn't improving with going to the gym is concerning for this. Start meloxicam 15mg  daily with food for pain and inflammation. Start physical therapy for 4 weeks. Follow up with me in 4 weeks. Ice area for 15 minutes at a time at end of workouts. If your pain is the same or worse and you're not improving at that point, I would recommend moving forward with an MRI of your elbow with the idea that if something is seen in the elbow, you would have this scoped and surgically addressed.

## 2011-05-23 NOTE — Assessment & Plan Note (Signed)
x-rays reviewed and no evidence of DJD or bony foreign body.  His history and exam are concerning for loose body or cartilage injury of left elbow.  He had stiffness, still has catching/popping of left elbow and has had significant difficulty increasing activity level due to pain deep in left elbow.  Will move forward with MRI to assess for loose body or other cartilage damage - he may need arthroscopy if seen on MRI given mechanical symptoms and failure to improve.  We discussed conservative care is an option but with his mechanical symptoms and as he's doing home exercises already, feel this is unlikely to improve his symptoms.

## 2011-05-23 NOTE — Progress Notes (Addendum)
Subjective:    Patient ID: Benjamin Leonard, male    DOB: [birth year], 45 y.o.   MRN: [id number]  PCP: Bowen  HPI 45 yo M here for left elbow injury.  Patient reports about 5-6 months ago while doing a P90x workout he felt a twinge deep in left elbow (during pullups) causing him to stop working out. No obvious bruising and swelling following this. Rested for several weeks then tried to go back to lifting. Can only do 10 pound arm curls due to pain, catching, clicking in left elbow that has persisted since this injury. Feels stiff as well. Pushing up off of left hand worsens pain in elbow. Pain is not in a specific location but feels mostly deep - some pain medial and lateral. Has not had radiographs of elbow. Sees a chiropractor who did deep massage of forearm muscles which did help some but most symptoms persist. + night pain. Right handed.  Past Medical History  Diagnosis Date  . Ulcerative colitis   . Hearing loss     Right ear w/ hearing aid (new one in 2008)  . OAB (overactive bladder)     or BPH?    Current Outpatient Prescriptions on File Prior to Visit  Medication Sig Dispense Refill  . loratadine (CLARITIN) 10 MG tablet Take 10 mg by mouth as needed.       . mesalamine (LIALDA) 1.2 G EC tablet Take by mouth. Take 2 tablets by mouth daily.         Past Surgical History  Procedure Date  . Rhinoplasty 1997  . Right ear surgery for scar tissue removal 1996    No Known Allergies  History   Social History  . Marital Status: Married    Spouse Name: [name]    Number of Children: 2  . Years of Education: N/A   Occupational History  . Freelance Wrtier, Product/process development scientist, also works for [employer]    Social History Main Topics  . Smoking status: Never Smoker   . Smokeless tobacco: Not on file  . Alcohol Use: Yes     rare   . Drug Use: Not on file  . Sexually Active: Not on file   Other Topics Concern  . Not on file   Social History Narrative   Moved from [place] in 2008. Mom  lives with them.Regular exercise:  Works out 3 days a week.2 children12 and 7Daughter 12 is type I diabeticMother has platelet disorder (thrombocytosis)Maternal grand parents - DM II    Family History  Problem Relation Age of Onset  . Hyperlipidemia Mother   . Hypertension Mother   . Hypertension Father   . Sudden death Father   . Sudden death Maternal Grandmother   . Diabetes Maternal Grandmother   . Diabetes Maternal Grandfather   . Heart attack Neg Hx     BP 130/88  Pulse 53  Temp(Src) 97.7 F (36.5 C) (Oral)  Ht 6' (1.829 m)  Wt 189 lb (85.73 kg)  BMI 25.63 kg/m2  Review of Systems See HPI above.    Objective:   Physical Exam Gen: NAD L elbow: No gross deformity, swelling, bruising. TTP anterior elbow with deep palpation between biceps tendon and extensor mass.  No other bony TTP.  Minimal TTP lateral and medial epicondyles. Strength 5/5 with elbow flexion and extension.  No pain with wrist extension or 3rd finger extension. FROM elbow and wrist. Collateral ligaments intact. NVI distally.    Assessment & Plan:  1. Left  elbow pain - x-rays reviewed and no evidence of DJD or bony foreign body.  His history and exam are concerning for loose body or cartilage injury of left elbow.  He had stiffness, still has catching/popping of left elbow and has had significant difficulty increasing activity level due to pain deep in left elbow.  Will move forward with MRI to assess for loose body or other cartilage damage - he may need arthroscopy if seen on MRI given mechanical symptoms and failure to improve.  We discussed conservative care is an option but with his mechanical symptoms and as he's doing home exercises already, feel this is unlikely to improve his symptoms.  Addendum:  Patient's MRI was reviewed and discussed results with patient.  No evidence of foreign body, cartilage injury, or ligament/biceps tendon tear.  He does have fluid surrounding distal biceps tendon going down  into forearm which is nonspecific but may be related to tendinopathy.  Either way, reassured patient but advised we should start him in PT for at least 1-3 visits to start rehabilitating left elbow.  Intraarticular injection is an option for diagnostic/therapeutic reasons if he does not continue to improve but MRI does not suggest this is warranted at this time - will consider after 4-6 weeks if not improving.

## 2011-06-02 ENCOUNTER — Ambulatory Visit (HOSPITAL_BASED_OUTPATIENT_CLINIC_OR_DEPARTMENT_OTHER)
Admission: RE | Admit: 2011-06-02 | Discharge: 2011-06-02 | Disposition: A | Discharge status: Home / Self Care | Payer: BC Managed Care – PPO | Source: Ambulatory Visit | Attending: Family Medicine | Admitting: Family Medicine

## 2011-06-02 DIAGNOSIS — M25529 Pain in unspecified elbow: Secondary | ICD-10-CM

## 2011-06-02 DIAGNOSIS — M25522 Pain in left elbow: Secondary | ICD-10-CM

## 2011-06-12 ENCOUNTER — Ambulatory Visit: Payer: BC Managed Care – PPO | Admitting: Physical Therapy

## 2011-06-19 ENCOUNTER — Ambulatory Visit: Payer: BC Managed Care – PPO | Attending: Family Medicine | Admitting: Physical Therapy

## 2011-06-19 DIAGNOSIS — IMO0001 Reserved for inherently not codable concepts without codable children: Secondary | ICD-10-CM | POA: Insufficient documentation

## 2011-06-19 DIAGNOSIS — M6281 Muscle weakness (generalized): Secondary | ICD-10-CM | POA: Insufficient documentation

## 2011-06-19 DIAGNOSIS — M25539 Pain in unspecified wrist: Secondary | ICD-10-CM | POA: Insufficient documentation

## 2011-06-25 ENCOUNTER — Ambulatory Visit: Payer: BC Managed Care – PPO | Admitting: Physical Therapy

## 2011-07-02 ENCOUNTER — Ambulatory Visit: Payer: BC Managed Care – PPO | Attending: Family Medicine | Admitting: Physical Therapy

## 2011-07-02 DIAGNOSIS — IMO0001 Reserved for inherently not codable concepts without codable children: Secondary | ICD-10-CM | POA: Insufficient documentation

## 2011-07-02 DIAGNOSIS — M6281 Muscle weakness (generalized): Secondary | ICD-10-CM | POA: Insufficient documentation

## 2011-07-02 DIAGNOSIS — M25539 Pain in unspecified wrist: Secondary | ICD-10-CM | POA: Insufficient documentation

## 2011-07-02 NOTE — Telephone Encounter (Signed)
  Phone Note Outgoing Call   Call placed by: Lajean Saver RN,  February 02, 2011 8:51 AM Call placed to: Sports Med Action Taken: Phone Call Completed, Appt scheduled Summary of Call: Per Dr. Donnamarie Poag request patient is scheduled to see Dr. Pearletha Forge @ 2:30pm today 02/02/11. Patient notified.

## 2011-07-02 NOTE — Progress Notes (Signed)
Summary: RT CALF INJ (room 4)   Vital Signs:  Patient Profile:   45 Years Old Male CC:      injury to right calf/ muscle strain Height:     72.75 inches Weight:      195 pounds O2 Sat:      99 % O2 treatment:    Room Air Temp:     98.6 degrees F oral Pulse rate:   68 / minute Resp:     18 per minute BP sitting:   129 / 87  (right arm) Cuff size:   large  Pt. in pain?   yes    Location:   right calf/leg  Vitals Entered By: Lavell Islam RN (February 01, 2011 6:27 PM)                   Updated Prior Medication List: LIALDA 1.2 GM TBEC (MESALAMINE) 2 tabs by mouth daily  Current Allergies: No known allergies History of Present Illness History from: patient Chief Complaint: injury to right calf/ muscle strain History of Present Illness: R calf injury.  He was at the water park a few days ago and felt a twinge in his R calf with a small bruise on his medial R foot. Able to keep walking.  Then went back a few days later and going down a slide felt pain again in the same spot.  Now with soreness, swelling.  He has a history of a L and R calf partial tear that were treated 2 seperate times in 2 different ways.  The L treated with PT and felt better.  The R with no PT and never got completely better. He has soreness, cramping in R calf.  No SOB or CP.  Not taking any meds.  Using ice which helps.  REVIEW OF SYSTEMS Constitutional Symptoms      Denies fever, chills, night sweats, weight loss, weight gain, and fatigue.  Eyes       Denies change in vision, eye pain, eye discharge, glasses, contact lenses, and eye surgery. Ear/Nose/Throat/Mouth       Denies hearing loss/aids, change in hearing, ear pain, ear discharge, dizziness, frequent runny nose, frequent nose bleeds, sinus problems, sore throat, hoarseness, and tooth pain or bleeding.  Respiratory       Denies dry cough, productive cough, wheezing, shortness of breath, asthma, bronchitis, and emphysema/COPD.  Cardiovascular  Denies murmurs, chest pain, and tires easily with exhertion.    Gastrointestinal       Denies stomach pain, nausea/vomiting, diarrhea, constipation, blood in bowel movements, and indigestion. Genitourniary       Denies painful urination, kidney stones, and loss of urinary control. Neurological       Denies paralysis, seizures, and fainting/blackouts. Musculoskeletal       Complains of muscle pain, decreased range of motion, and muscle weakness.      Denies joint pain, joint stiffness, redness, swelling, and gout.      Comments: right calf Skin       Denies bruising, unusual mles/lumps or sores, and hair/skin or nail changes.  Psych       Denies mood changes, temper/anger issues, anxiety/stress, speech problems, depression, and sleep problems. Other Comments: injured right calf muscle   Past History:  Family History: Last updated: 05/27/2007 mother high chol and HTN father died at 5 ? 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  Mom lives with them. Married to [employer].  2 kids. Never smoked. No ETOH. Works out 3 days / wk.  Past Medical History: Reviewed history from 11/20/2007 and no changes required. ulcerative colitis -  hearing loss, R ear with hearing aid (new one in 2008) OAB or BPH?  Dr Janna [facility]   Past Surgical History: Reviewed history from 05/27/2007 and no changes required. colonoscopies, last one end of 2007 rhinoplasty 1997 R ear surgery for scar tissue removal 1996  Family History: Reviewed history from 05/27/2007 and no changes required. mother high chol and HTN father died at 29 ? cause 2 brothers healthy  Social History: Reviewed history from 12/30/2009 and no changes required. 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  Mom lives with them. Married to [employer].  2 kids. Never smoked. No ETOH. Works  out 3 days / wk. Physical Exam General appearance: well developed, well nourished,mild  distress, on crutches R knee FROM and no tenderness.  R ankle FROM and no tenderness.  Calf pain reproduced with straight leg forced ankle dorsiflexion as well as bent knee forced ankle dorsiflexion.  No swelling, redness seen.  Palpating along medial gastroc causes pain.  Achilles tendon is mildly tender at musculotendinous junction.  Distal NV status intact. Assessment New Problems: CALF PAIN (ICD-729.5)   Plan New Orders: Est. Patient Level III [99213] Ketorolac-Toradol 15mg  [Z6109] Planning Comments:   This appears to be an injury such as a soleus or medial gastroc strain.  This doesn't appear to be a DVT.  I would like him to see Dr. Pearletha Forge tomorrow to see if he can do an ultrasound to see if there is any partial tear that can be seen.  Until then, I'd like him to stay NWB on crutches.  Can use OTC nsaids at home.  Encourage ice, elevation, and rest.  May need a further workup (MRI) but the pt would definitely like to be set up with PT, so ortho can do that and follow the patient.  Pt understands and agrees.   The patient and/or caregiver has been counseled thoroughly with regard to medications prescribed including dosage, schedule, interactions, rationale for use, and possible side effects and they verbalize understanding.  Diagnoses and expected course of recovery discussed and will return if not improved as expected or if the condition worsens. Patient and/or caregiver verbalized understanding.   Orders Added: 1)  Est. Patient Level III [60454] 2)  Ketorolac-Toradol 15mg  [U9811]

## 2011-07-02 NOTE — Letter (Signed)
   [place] [facility] [address] [place], [place]  [address]  [place] Phone: [phone]      October 30, 2010   Benjamin Leonard [address] [place], [place] [address]  RE:  LAB RESULTS  Dear  Benjamin Leonard,  The following is an interpretation of your most recent lab tests.  Please take note of any instructions provided or changes to medications that have resulted from your lab work.  ELECTROLYTES:  Good - no changes needed  KIDNEY FUNCTION TESTS:  Good - no changes needed  LIVER FUNCTION TESTS:  Good - no changes needed  LIPID PANEL:  Fair - review at your next visit Triglyceride: 104   Cholesterol: 200   LDL: 139   HDL: 40   Chol/HDL%:  5.0 Ratio  THYROID STUDIES:  Thyroid studies normal TSH: 1.575     Testosterone level:  normal  C Reactive Protein:  normal  AM Cortisol level: normal  Vitamin D level:  normal       Sincerely Yours,    Dr. Thomos Lemons

## 2011-07-09 ENCOUNTER — Encounter: Payer: BC Managed Care – PPO | Admitting: Physical Therapy

## 2011-07-13 ENCOUNTER — Ambulatory Visit: Payer: BC Managed Care – PPO | Admitting: Physical Therapy

## 2011-09-04 DIAGNOSIS — H9011 Conductive hearing loss, unilateral, right ear, with unrestricted hearing on the contralateral side: Secondary | ICD-10-CM | POA: Insufficient documentation

## 2011-10-05 DIAGNOSIS — M25449 Effusion, unspecified hand: Secondary | ICD-10-CM

## 2011-10-05 DIAGNOSIS — M79609 Pain in unspecified limb: Secondary | ICD-10-CM

## 2011-11-05 ENCOUNTER — Emergency Department
Admit: 2011-11-05 | Discharge: 2011-11-05 | Disposition: A | Discharge status: Home / Self Care | Payer: BC Managed Care – PPO

## 2011-11-05 ENCOUNTER — Encounter: Payer: Self-pay | Admitting: *Deleted

## 2011-11-05 ENCOUNTER — Emergency Department
Admission: EM | Admit: 2011-11-05 | Discharge: 2011-11-05 | Disposition: A | Discharge status: Home / Self Care | Payer: BC Managed Care – PPO | Source: Home / Self Care | Attending: Family Medicine | Admitting: Family Medicine

## 2011-11-05 DIAGNOSIS — M25449 Effusion, unspecified hand: Secondary | ICD-10-CM

## 2011-11-05 DIAGNOSIS — M79641 Pain in right hand: Secondary | ICD-10-CM

## 2011-11-05 DIAGNOSIS — M79609 Pain in unspecified limb: Secondary | ICD-10-CM

## 2011-11-05 MED ORDER — CELECOXIB 200 MG PO CAPS: 200.0000 mg | ORAL_CAPSULE | Freq: Two times a day (BID) | ORAL | Status: AC

## 2011-11-05 NOTE — Discharge Instructions (Signed)
No acute injury on Xray.  Would like for you to follow up with Dr. Norton Blizzard within the next two weeks.  Splint to fingers for next 3-4 days.  Begin Celebrex, take two for the first dose then take one twice daily.  Apply Ice three times daily next two days then you may switch to heat.

## 2011-11-05 NOTE — ED Provider Notes (Signed)
History     CSN: [id number]  Arrival date & time 11/05/11  4098   First MD Initiated Contact with Patient 11/05/11 [phone]      Chief Complaint  Patient presents with  . Hand Injury    (Consider location/radiation/quality/duration/timing/severity/associated sxs/prior treatment) Patient is a 46 y.o. male presenting with hand injury. The history is provided by the patient.  Hand Injury    Benjamin Leonard is a 46 y.o. male who sustained a right hand injury yesterday ago. Mechanism of injury: drain cover pinned hand to ground. Immediate symptoms: pain. Symptoms have been improved-no further pain but swelling and stiffness reported to third and fourth PIP's since that yesterday. Has applied Ice to right hand, denies numbness and tingling.  Prior history of related problems: 1 month ago smashed same wrist against counter at home while cooking, did not seek medical exam/treatment, reports "seem to heal well."   Past Medical History  Diagnosis Date  . Ulcerative colitis   . Hearing loss     Right ear w/ hearing aid (new one in 2008)  . OAB (overactive bladder)     or BPH?    Past Surgical History  Procedure Date  . Rhinoplasty 1997  . Right ear surgery for scar tissue removal 1996    Family History  Problem Relation Age of Onset  . Hyperlipidemia Mother   . Hypertension Mother   . Hypertension Father   . Sudden death Father   . Sudden death Maternal Grandmother   . Diabetes Maternal Grandmother   . Diabetes Maternal Grandfather   . Heart attack Neg Hx     History  Substance Use Topics  . Smoking status: Never Smoker   . Smokeless tobacco: Not on file  . Alcohol Use: Yes     rare       Review of Systems  All other systems reviewed and are negative.    Allergies  Review of patient's allergies indicates no known allergies.  Home Medications   Current Outpatient Rx  Name Route Sig Dispense Refill  . LORATADINE 10 MG PO TABS Oral Take 10 mg by mouth as needed.      Marland Kitchen MESALAMINE 1.2 G PO TBEC Oral Take by mouth. Take 2 tablets by mouth daily.       BP 128/75  Pulse 59  Resp 14  Ht 6' (1.829 m)  Wt 192 lb (87.091 kg)  BMI 26.04 kg/m2  SpO2 100%  Physical Exam  Constitutional: He is oriented to person, place, and time. Vital signs are normal. He appears well-developed and well-nourished. He is active and cooperative.  HENT:  Head: Normocephalic.  Eyes: Conjunctivae are normal. Pupils are equal, round, and reactive to light. No scleral icterus.  Neck: Trachea normal. Neck supple.  Cardiovascular: Normal rate and regular rhythm.   Pulses:      Radial pulses are 3+ on the right side, and 3+ on the left side.  Pulmonary/Chest: Effort normal and breath sounds normal.  Musculoskeletal:       Right wrist: Normal.       Left wrist: Normal.       Arms: Neurological: He is alert and oriented to person, place, and time. He has normal strength. No cranial nerve deficit or sensory deficit.  Skin: Skin is warm and dry.  Psychiatric: He has a normal mood and affect. His speech is normal and behavior is normal. Judgment and thought content normal. Cognition and memory are normal.    ED Course  Procedures   Labs Reviewed - No data to display Dg Hand Complete Right  11/05/2011  *RADIOLOGY REPORT*  Clinical Data: Hand injury 1 month ago and yesterday.  Swelling of the second, third and fourth metacarpal phalangeal joints.  RIGHT HAND - COMPLETE 3+ VIEW  Comparison: Wrist radiographs 01/12/2010.  Findings: The mineralization and alignment are normal.  There is no evidence of acute fracture or dislocation.  There is mild radiocarpal joint space loss.  No focal soft tissue swelling is evident.  IMPRESSION: No acute osseous findings.  Original Report Authenticated By: Gerrianne Scale, M.D.     1. Right hand pain       MDM  No acute injury on Xray.  Would like for you to follow up with Dr. Norton Blizzard within the next two weeks.  Splint to fingers for  next 3-4 days.  Begin Celebrex, take two for the first dose then take one twice daily.  Apply Ice three times daily next two days then you may switch to heat.     Johnsie Kindred, NP 11/05/11 1100

## 2011-11-05 NOTE — ED Provider Notes (Signed)
History     CSN: [id number]  Arrival date & time 11/05/11  4098   First MD Initiated Contact with Patient 11/05/11 [phone]      Chief Complaint  Patient presents with  . Hand Injury    (Consider location/radiation/quality/duration/timing/severity/associated sxs/prior treatment) HPI Comments: See notes by Lannie Fields, NP.   See notes by Lannie Fields, NP.  Past Medical History  Diagnosis Date  . Ulcerative colitis   . Hearing loss     Right ear w/ hearing aid (new one in 2008)  . OAB (overactive bladder)     or BPH?    Past Surgical History  Procedure Date  . Rhinoplasty 1997  . Right ear surgery for scar tissue removal 1996    Family History  Problem Relation Age of Onset  . Hyperlipidemia Mother   . Hypertension Mother   . Hypertension Father   . Sudden death Father   . Sudden death Maternal Grandmother   . Diabetes Maternal Grandmother   . Diabetes Maternal Grandfather   . Heart attack Neg Hx     History  Substance Use Topics  . Smoking status: Never Smoker   . Smokeless tobacco: Not on file  . Alcohol Use: Yes     rare       Review of Systems See notes by Lannie Fields, NP.  Allergies  Review of patient's allergies indicates no known allergies.  Home Medications   Current Outpatient Rx  Name Route Sig Dispense Refill  . CELECOXIB 200 MG PO CAPS Oral Take 1 capsule (200 mg total) by mouth 2 (two) times daily. 60 capsule 0  . LORATADINE 10 MG PO TABS Oral Take 10 mg by mouth as needed.     Marland Kitchen MESALAMINE 1.2 G PO TBEC Oral Take by mouth. Take 2 tablets by mouth daily.       BP 128/75  Pulse 59  Resp 14  Ht 6' (1.829 m)  Wt 192 lb (87.091 kg)  BMI 26.04 kg/m2  SpO2 100%  Physical Exam See notes by Lannie Fields, NP.  ED Course  Procedures (including critical care time)  Labs Reviewed - No data to display Dg Hand Complete Right  11/05/2011  *RADIOLOGY REPORT*  Clinical Data: Hand injury 1 month ago and yesterday.  Swelling of the  second, third and fourth metacarpal phalangeal joints.  RIGHT HAND - COMPLETE 3+ VIEW  Comparison: Wrist radiographs 01/12/2010.  Findings: The mineralization and alignment are normal.  There is no evidence of acute fracture or dislocation.  There is mild radiocarpal joint space loss.  No focal soft tissue swelling is evident.  IMPRESSION: No acute osseous findings.  Original Report Authenticated By: Gerrianne Scale, M.D.     1. Right hand pain   2. Swelling of hand joint       MDM  See notes by Lannie Fields, NP.         Lattie Haw, MD 11/05/11 Ebony Cargo

## 2011-11-05 NOTE — ED Provider Notes (Signed)
Agree with exam, assessment, and plan.   Lattie Haw, MD 11/05/11 [phone]

## 2011-11-05 NOTE — ED Notes (Signed)
Patient c/o right hand/knuckle swelling and stiffness. He injured his knuckles about 1 month ago hitting them on the counter. Yesterday while moving a drain cover it was set down on his right hand/knuckles. Used ice. C/o no pain, only stiffness.

## 2011-11-06 ENCOUNTER — Encounter: Payer: Self-pay | Admitting: Family Medicine

## 2011-11-06 ENCOUNTER — Ambulatory Visit (INDEPENDENT_AMBULATORY_CARE_PROVIDER_SITE_OTHER): Payer: BC Managed Care – PPO | Admitting: Family Medicine

## 2011-11-06 VITALS — BP 132/76 | HR 77 | Temp 98.0°F | Ht 72.0 in | Wt 190.0 lb

## 2011-11-06 DIAGNOSIS — M79641 Pain in right hand: Secondary | ICD-10-CM

## 2011-11-06 DIAGNOSIS — M25529 Pain in unspecified elbow: Secondary | ICD-10-CM

## 2011-11-06 DIAGNOSIS — M79609 Pain in unspecified limb: Secondary | ICD-10-CM

## 2011-11-06 DIAGNOSIS — M25522 Pain in left elbow: Secondary | ICD-10-CM

## 2011-11-07 ENCOUNTER — Encounter: Payer: Self-pay | Admitting: Family Medicine

## 2011-11-07 NOTE — Progress Notes (Signed)
Subjective:    Patient ID: Benjamin Leonard, male    DOB: 04-14-1966, 46 y.o.   MRN: [id number]  PCP: [facility]  Hand Injury    46 yo M here for f/u left elbow injury, new right hand/finger injury  05/23/11: Patient reports about 5-6 months ago while doing a P90x workout he felt a twinge deep in left elbow (during pullups) causing him to stop working out. No obvious bruising and swelling following this. Rested for several weeks then tried to go back to lifting. Can only do 10 pound arm curls due to pain, catching, clicking in left elbow that has persisted since this injury. Feels stiff as well. Pushing up off of left hand worsens pain in elbow. Pain is not in a specific location but feels mostly deep - some pain medial and lateral. Has not had radiographs of elbow. Sees a chiropractor who did deep massage of forearm muscles which did help some but most symptoms persist. + night pain. Right handed.  11/06/11: Patient reports over a month ago he hit dorsal aspect of right hand on countertop. Had pain, swelling immediately following this - after a few days pain resolved but was still swollen mostly over PIP joints of 2nd-4th digits. Has not had any limited motion though feels stiff sometimes.  Able to flex and extend at all joints of right hand. Is right handed. Then reports a couple days ago he picked up a manhole cover to recover an item that had fallen in there - pressure from cover between it and the concrete worsened pain dorsally 2nd - 4th digits. No new injury though. Went to [facility] and had normal x-rays. Has been using a volar padded SAM splint with PIP and DIPs of 3rd and 4th digits in extension. Denies any pain currently. Regarding his left elbow, MRI was negative for intraarticular pathology - still with some deep seated pain in left elbow.  We had previously discussed trial of intraarticular injection x 1 and he would like to try this.  Past Medical History  Diagnosis Date  .  Ulcerative colitis   . Hearing loss     Right ear w/ hearing aid (new one in 2008)  . OAB (overactive bladder)     or BPH?    Current Outpatient Prescriptions on File Prior to Visit  Medication Sig Dispense Refill  . celecoxib (CELEBREX) 200 MG capsule Take 1 capsule (200 mg total) by mouth 2 (two) times daily.  60 capsule  0  . loratadine (CLARITIN) 10 MG tablet Take 10 mg by mouth as needed.       . mesalamine (LIALDA) 1.2 G EC tablet Take by mouth. Take 2 tablets by mouth daily.         Past Surgical History  Procedure Date  . Rhinoplasty 1997  . Right ear surgery for scar tissue removal 1996    No Known Allergies  History   Social History  . Marital Status: Married    Spouse Name: [name]    Number of Children: 2  . Years of Education: N/A   Occupational History  . Freelance Wrtier, Product/process development scientist, also works for [employer]    Social History Main Topics  . Smoking status: Never Smoker   . Smokeless tobacco: Not on file  . Alcohol Use: Yes     rare   . Drug Use: Not on file  . Sexually Active: Not on file   Other Topics Concern  . Not on file   Social  History Narrative   Moved from [place] in 2008. Mom lives with them.Regular exercise:  Works out 3 days a week.2 children12 and 7Daughter 12 is type I diabeticMother has platelet disorder (thrombocytosis)Maternal grand parents - DM II    Family History  Problem Relation Age of Onset  . Hyperlipidemia Mother   . Hypertension Mother   . Hypertension Father   . Sudden death Father   . Sudden death Maternal Grandmother   . Diabetes Maternal Grandmother   . Diabetes Maternal Grandfather   . Heart attack Neg Hx     BP 132/76  Pulse 77  Temp(Src) 98 F (36.7 C) (Oral)  Ht 6' (1.829 m)  Wt 190 lb (86.183 kg)  BMI 25.77 kg/m2  Review of Systems  See HPI above.    Objective:   Physical Exam  Gen: NAD  R hand: Mild swelling dorsal aspect of 2nd-4th digits mostly at prox to middle phalanges.  No bruising.  No  boutonniere, mallet deformities. Mild TTP 2nd - 4th digits dorsally in same locations. 5/5 strength with extension and flexion at PIP, MCP, DIP joints 2nd-4th digits. Collateral ligaments intact here as well. NVI distally.  L elbow: No gross deformity, swelling, bruising. TTP anterior elbow with deep palpation.  No other bony TTP.  Strength 5/5 with elbow flexion and extension.  No pain with wrist extension or 3rd finger extension. FROM. Collateral ligaments intact. NVI distally.    Assessment & Plan:  1. Right 2nd-4th digit pain - 2/2 crush injury with > 1 month ago having direct blow injury to counter.  He has excellent strength of extension PIP and DIP joints 2nd-4th digits and mechanism would be unlikely/unusual to cause a central slip rupture.  I reassured him regarding this.  Radiographs negative for fracture.  Discontinue splinting.  Icing, tylenol, motrin as needed.  Advised if he has any issues with extension to call me.  2. Left elbow pain - Has done home exercise program.  MRI negative for intraarticular pathology.  Will try intraarticular injection today for diagnostic and therapeutic purposes.  Relative rest next 5 days then resume HEP.    After informed written consent patient seated on exam table.  Lateral aspect of left elbow prepped with alcohol swab between radial head, lateral epicondyle, olecranon and then injected with 1:1 marcaine: depomedrol.  Patient tolerated procedure well without immediate complications.

## 2011-11-08 DIAGNOSIS — M79641 Pain in right hand: Secondary | ICD-10-CM | POA: Insufficient documentation

## 2011-11-08 NOTE — Assessment & Plan Note (Signed)
Has done home exercise program.  MRI negative for intraarticular pathology.  Will try intraarticular injection today for diagnostic and therapeutic purposes.  Relative rest next 5 days then resume HEP.    After informed written consent patient seated on exam table.  Lateral aspect of left elbow prepped with alcohol swab between radial head, lateral epicondyle, olecranon and then injected with 1:1 marcaine: depomedrol.  Patient tolerated procedure well without immediate complications.

## 2011-11-08 NOTE — Assessment & Plan Note (Signed)
2/2 crush injury with > 1 month ago having direct blow injury to counter.  He has excellent strength of extension PIP and DIP joints 2nd-4th digits and mechanism would be unlikely/unusual to cause a central slip rupture.  I reassured him regarding this.  Radiographs negative for fracture.  Discontinue splinting.  Icing, tylenol, motrin as needed.  Advised if he has any issues with extension to call me.

## 2011-12-18 ENCOUNTER — Ambulatory Visit (INDEPENDENT_AMBULATORY_CARE_PROVIDER_SITE_OTHER): Payer: BC Managed Care – PPO | Admitting: Family Medicine

## 2011-12-18 ENCOUNTER — Encounter: Payer: Self-pay | Admitting: Family Medicine

## 2011-12-18 VITALS — BP 118/80 | HR 67 | Temp 97.9°F | Ht 72.0 in | Wt 190.0 lb

## 2011-12-18 DIAGNOSIS — M79609 Pain in unspecified limb: Secondary | ICD-10-CM

## 2011-12-18 DIAGNOSIS — M79641 Pain in right hand: Secondary | ICD-10-CM

## 2011-12-18 NOTE — Assessment & Plan Note (Signed)
2/2 crush injury with now 2 1/2 months ago suffering direct blow to counter.  Pain, swelling have persisted - difficulty making a fist.  Able to flex and extend but not improving as expected with home exercises, motion/stretches, icing, tylenol/motrin.  Radiographs showed no evidence of DJD at PIP or DIP joints of 2nd-4th digits.  Concern would be for a partial extensor tendon tear given length of his symptoms, not improving as expected with the above, pain with extension at PIP joints.  Will move forward with MRI of digits to further assess.

## 2011-12-18 NOTE — Progress Notes (Addendum)
Subjective:    Patient ID: Benjamin Leonard, male    DOB: September 10, 1965, 46 y.o.   MRN: [id number]  PCP: [facility]  Hand Injury    46 yo M here for f/u right hand/finger injury  05/23/11: Patient reports about 5-6 months ago while doing a P90x workout he felt a twinge deep in left elbow (during pullups) causing him to stop working out. No obvious bruising and swelling following this. Rested for several weeks then tried to go back to lifting. Can only do 10 pound arm curls due to pain, catching, clicking in left elbow that has persisted since this injury. Feels stiff as well. Pushing up off of left hand worsens pain in elbow. Pain is not in a specific location but feels mostly deep - some pain medial and lateral. Has not had radiographs of elbow. Sees a chiropractor who did deep massage of forearm muscles which did help some but most symptoms persist. + night pain. Right handed.  11/06/11: Patient reports over a month ago he hit dorsal aspect of right hand on countertop. Had pain, swelling immediately following this - after a few days pain resolved but was still swollen mostly over PIP joints of 2nd-4th digits. Has not had any limited motion though feels stiff sometimes.  Able to flex and extend at all joints of right hand. Is right handed. Then reports a couple days ago he picked up a manhole cover to recover an item that had fallen in there - pressure from cover between it and the concrete worsened pain dorsally 2nd - 4th digits. No new injury though. Went to  [facility] and had normal x-rays. Has been using a volar padded SAM splint with PIP and DIPs of 3rd and 4th digits in extension. Denies any pain currently. Regarding his left elbow, MRI was negative for intraarticular pathology - still with some deep seated pain in left elbow.  We had previously discussed trial of intraarticular injection x 1 and he would like to try this.  5/21: Patient denies new injury. States dorsal aspect of 2nd >  3rd and 4th digits at PIP joints still painful, stiff. Feels that he can flex and extend these though pain with extension especially at PIP joint. No FH RA or other autoimmune disease.  Past Medical History  Diagnosis Date  . Ulcerative colitis   . Hearing loss     Right ear w/ hearing aid (new one in 2008)  . OAB (overactive bladder)     or BPH?    Current Outpatient Prescriptions on File Prior to Visit  Medication Sig Dispense Refill  . loratadine (CLARITIN) 10 MG tablet Take 10 mg by mouth as needed.       . mesalamine (LIALDA) 1.2 G EC tablet Take by mouth. Take 2 tablets by mouth daily.         Past Surgical History  Procedure Date  . Rhinoplasty 1997  . Right ear surgery for scar tissue removal 1996    No Known Allergies  History   Social History  . Marital Status: Married    Spouse Name: [name]    Number of Children: 2  . Years of Education: N/A   Occupational History  . Freelance Wrtier, Product/process development scientist, also works for [employer]    Social History Main Topics  . Smoking status: Never Smoker   . Smokeless tobacco: Not on file  . Alcohol Use: Yes     rare   . Drug Use: Not on file  . Sexually  Active: Not on file   Other Topics Concern  . Not on file   Social History Narrative   Moved from [place] in 2008. Mom lives with them.Regular exercise:  Works out 3 days a week.2 children12 and 7Daughter 12 is type I diabeticMother has platelet disorder (thrombocytosis)Maternal grand parents - DM II    Family History  Problem Relation Age of Onset  . Hyperlipidemia Mother   . Hypertension Mother   . Hypertension Father   . Sudden death Father   . Sudden death Maternal Grandmother   . Diabetes Maternal Grandmother   . Diabetes Maternal Grandfather   . Heart attack Neg Hx     BP 118/80  Pulse 67  Temp(Src) 97.9 F (36.6 C) (Oral)  Ht 6' (1.829 m)  Wt 190 lb (86.183 kg)  BMI 25.77 kg/m2  Review of Systems  See HPI above.    Objective:   Physical Exam  Gen:  NAD  R hand: Mild swelling dorsal aspect of 2nd-4th digits mostly at prox to middle phalanges, across PIP joints.  No bruising.  No boutonniere, mallet deformities. Mod TTP 2nd digit at PIP, mild  TTP 3rd & 4th digits dorsally in same locations. 5/5 strength with extension and flexion at PIP, MCP, DIP joints 2nd-4th digits. Pain reproduced at PIP joints 2nd > 3rd, 4th digits with finger extension. Collateral ligaments intact. NVI distally.    Assessment & Plan:  1. Right 2nd-4th digit pain - 2/2 crush injury with now 2 1/2 months ago suffering direct blow to counter.  Pain, swelling have persisted - difficulty making a fist.  Able to flex and extend but not improving as expected with home exercises, motion/stretches, icing, tylenol/motrin.  Radiographs showed no evidence of DJD at PIP or DIP joints of 2nd-4th digits.  Concern would be for a partial extensor tendon tear given length of his symptoms, not improving as expected with the above, pain with extension at PIP joints.  Will move forward with MRI of digits to further assess.    Addendum 6/11:  Patient had an additional MRI with better resolution to assess extensor tendons and he does have small partial extensor tendon tears - ulnar side of index and ring, central partial tear of middle finger - all proximal to PIP.  We discussed options - he's actually feeling a lot better other than it appearing more swollen.  I would recommend conservative treatment, OT but advised him we should at least consult hand surgery for their opinion as well.  Will move forward with the referral.

## 2011-12-22 ENCOUNTER — Ambulatory Visit (HOSPITAL_BASED_OUTPATIENT_CLINIC_OR_DEPARTMENT_OTHER)
Admission: RE | Admit: 2011-12-22 | Discharge: 2011-12-22 | Disposition: A | Discharge status: Home / Self Care | Payer: BC Managed Care – PPO | Source: Ambulatory Visit | Attending: Family Medicine | Admitting: Family Medicine

## 2011-12-22 DIAGNOSIS — M79641 Pain in right hand: Secondary | ICD-10-CM

## 2011-12-22 DIAGNOSIS — M25549 Pain in joints of unspecified hand: Secondary | ICD-10-CM | POA: Insufficient documentation

## 2012-01-04 ENCOUNTER — Telehealth: Payer: Self-pay | Admitting: Family

## 2012-01-04 NOTE — Telephone Encounter (Signed)
Pt states he has never used O2, remembers a sleep study 1 1/2-2 yrs ago. States he was never set up on a cpap as his results were "inconclusive". Will contact [facility] for clarification.

## 2012-01-04 NOTE — Telephone Encounter (Signed)
Notified Pam at [facility] that we are not renewing Rx at this time.  Form faxed to [phone].

## 2012-01-04 NOTE — Telephone Encounter (Signed)
Noted request from [facility] for oxygen orders.  I will need to see pt in office before I can sign any paperwork as I have never met pt- his last visit with Dr. Artist Pais was 1 yr ago.

## 2012-01-09 NOTE — Addendum Note (Signed)
Addended by: Lenda Kelp on: 01/09/2012 12:58 PM   Modules accepted: Orders

## 2012-02-14 ENCOUNTER — Ambulatory Visit (INDEPENDENT_AMBULATORY_CARE_PROVIDER_SITE_OTHER): Payer: BC Managed Care – PPO | Admitting: Family Medicine

## 2012-02-14 ENCOUNTER — Encounter: Payer: Self-pay | Admitting: Family Medicine

## 2012-02-14 VITALS — BP 117/82 | HR 62 | Temp 97.8°F | Ht 72.0 in | Wt 195.0 lb

## 2012-02-14 DIAGNOSIS — M25579 Pain in unspecified ankle and joints of unspecified foot: Secondary | ICD-10-CM

## 2012-02-14 DIAGNOSIS — M25571 Pain in right ankle and joints of right foot: Secondary | ICD-10-CM | POA: Insufficient documentation

## 2012-02-14 NOTE — Progress Notes (Signed)
Subjective:    Patient ID: Benjamin Leonard, male    DOB: 11-04-1965, 46 y.o.   MRN: [id number]  PCP: Dr. Rodena Medin  HPI 46 yo M here for right ankle pain.  Patient denies known injury. States after second day of painting, being up on a ladder noticed medial right ankle pain and swelling. Worse with standing, going up on toes, driving. Radiates up medial calf. Not icing or taking any meds. Had medial calf strain and plantar fasciitis previously in this ankle - s/p plantar fascia release.  Past Medical History  Diagnosis Date  . Ulcerative colitis   . Hearing loss     Right ear w/ hearing aid (new one in 2008)  . OAB (overactive bladder)     or BPH?    Current Outpatient Prescriptions on File Prior to Visit  Medication Sig Dispense Refill  . loratadine (CLARITIN) 10 MG tablet Take 10 mg by mouth as needed.       . mesalamine (LIALDA) 1.2 G EC tablet Take by mouth. Take 2 tablets by mouth daily.         Past Surgical History  Procedure Date  . Rhinoplasty 1997  . Right ear surgery for scar tissue removal 1996    No Known Allergies  History   Social History  . Marital Status: Married    Spouse Name: [name]    Number of Children: 2  . Years of Education: N/A   Occupational History  . Freelance Wrtier, Product/process development scientist, also works for [employer]    Social History Main Topics  . Smoking status: Never Smoker   . Smokeless tobacco: Not on file  . Alcohol Use: Yes     rare   . Drug Use: Not on file  . Sexually Active: Not on file   Other Topics Concern  . Not on file   Social History Narrative   Moved from [place] in 2008. Mom lives with them.Regular exercise:  Works out 3 days a week.2 children12 and 7Daughter 12 is type I diabeticMother has platelet disorder (thrombocytosis)Maternal grand parents - DM II    Family History  Problem Relation Age of Onset  . Hyperlipidemia Mother   . Hypertension Mother   . Hypertension Father   . Sudden death Father   . Sudden death Maternal  Grandmother   . Diabetes Maternal Grandmother   . Diabetes Maternal Grandfather   . Heart attack Neg Hx     BP 117/82  Pulse 62  Temp 97.8 F (36.6 C) (Oral)  Ht 6' (1.829 m)  Wt 195 lb (88.451 kg)  BMI 26.45 kg/m2  Review of Systems See HPI above.    Objective:   Physical Exam Gen: NAD  R ankle: No gross deformity, swelling, ecchymoses. FROM with mild pain on plantarflexion medial ankle post to medial malleolus. TTP inferior and posterior to medial malleolus.  No malleolar, base 5th, navicular, PF, achilles TTP.  TTP deep medial calf. Negative ant drawer and talar tilt.   Negative syndesmotic compression. Thompsons test negative. NV intact distally. Mild pain with calf raise.    Assessment & Plan:  1. Right ankle pain - 2/2 flexor tendon/post tib strain.  Reassured.  Has arch supports - to continue using these.  Shown home exercise program.  Has ankle brace for support as well.  Icing, nsaids as needed.  Activities as tolerated.  Consider calf sleeve.  Should resolve over next 2-3 weeks.  Avoid cutting activities, heavy weights with calf raises until pain  resolves.  F/u prn.

## 2012-02-14 NOTE — Assessment & Plan Note (Signed)
2/2 flexor tendon/post tib strain.  Reassured.  Has arch supports - to continue using these.  Shown home exercise program.  Has ankle brace for support as well.  Icing, nsaids as needed.  Activities as tolerated.  Consider calf sleeve.  Should resolve over next 2-3 weeks.  Avoid cutting activities, heavy weights with calf raises until pain resolves.  F/u prn.

## 2012-02-18 ENCOUNTER — Encounter: Payer: Self-pay | Admitting: Family Medicine

## 2012-03-04 ENCOUNTER — Ambulatory Visit (INDEPENDENT_AMBULATORY_CARE_PROVIDER_SITE_OTHER): Payer: BC Managed Care – PPO | Admitting: Internal Medicine

## 2012-03-04 ENCOUNTER — Encounter: Payer: Self-pay | Admitting: Internal Medicine

## 2012-03-04 VITALS — BP 122/84 | HR 56 | Temp 97.8°F | Resp 16 | Ht 72.0 in | Wt 194.0 lb

## 2012-03-04 DIAGNOSIS — Z0000 Encounter for general adult medical examination without abnormal findings: Secondary | ICD-10-CM

## 2012-03-04 LAB — BASIC METABOLIC PANEL
CO2: 29 mEq/L (ref 19–32)
Chloride: 103 mEq/L (ref 96–112)
Creat: 1.1 mg/dL (ref 0.50–1.35)
Potassium: 4.6 mEq/L (ref 3.5–5.3)

## 2012-03-04 LAB — HEPATIC FUNCTION PANEL
ALT: 22 U/L (ref 0–53)
AST: 22 U/L (ref 0–37)
Alkaline Phosphatase: 71 U/L (ref 39–117)
Bilirubin, Direct: 0.1 mg/dL (ref 0.0–0.3)
Indirect Bilirubin: 0.4 mg/dL (ref 0.0–0.9)
Total Protein: 7.3 g/dL (ref 6.0–8.3)

## 2012-03-04 LAB — CBC WITH DIFFERENTIAL/PLATELET
Basophils Absolute: 0 10*3/uL (ref 0.0–0.1)
Eosinophils Relative: 1 % (ref 0–5)
HCT: 43.7 % (ref 39.0–52.0)
Lymphocytes Relative: 32 % (ref 12–46)
Lymphs Abs: 1.3 10*3/uL (ref 0.7–4.0)
MCV: 87.8 fL (ref 78.0–100.0)
Neutro Abs: 2.3 10*3/uL (ref 1.7–7.7)
Platelets: 249 10*3/uL (ref 150–400)
RBC: 4.98 MIL/uL (ref 4.22–5.81)
RDW: 12.8 % (ref 11.5–15.5)
WBC: 4.2 10*3/uL (ref 4.0–10.5)

## 2012-03-04 LAB — PSA: PSA: 0.75 ng/mL (ref <=4.00)

## 2012-03-04 LAB — LIPID PANEL
LDL Cholesterol: 135 mg/dL — ABNORMAL HIGH (ref 0–99)
Total CHOL/HDL Ratio: 4.7 Ratio
VLDL: 30 mg/dL (ref 0–40)

## 2012-03-04 NOTE — Patient Instructions (Signed)
Please schedule fasting labs prior to next year's physical Cbc, chem7, lipid, lft, tsh, psa, urinalysis with reflex-v70.0

## 2012-03-05 ENCOUNTER — Telehealth: Payer: Self-pay | Admitting: Internal Medicine

## 2012-03-05 LAB — URINALYSIS, ROUTINE W REFLEX MICROSCOPIC
Glucose, UA: NEGATIVE mg/dL
Hgb urine dipstick: NEGATIVE
Leukocytes, UA: NEGATIVE
Protein, ur: NEGATIVE mg/dL
Urobilinogen, UA: 0.2 mg/dL (ref 0.0–1.0)

## 2012-03-05 NOTE — Telephone Encounter (Signed)
Received medical records from [facility]

## 2012-03-08 DIAGNOSIS — Z0000 Encounter for general adult medical examination without abnormal findings: Secondary | ICD-10-CM | POA: Insufficient documentation

## 2012-03-08 NOTE — Progress Notes (Signed)
  Subjective:    Patient ID: Benjamin Leonard, male    DOB: [birth year], 46 y.o.   MRN: [id number]  HPI Pt presents to clinic for annual exam. H/o UC followed by GI without recent flare. UTD with colonoscopy. No active complaints.  Past Medical History  Diagnosis Date  . Ulcerative colitis   . Hearing loss     Right ear w/ hearing aid (new one in 2008)  . OAB (overactive bladder)     or BPH?   Past Surgical History  Procedure Date  . Rhinoplasty 1997  . Right ear surgery for scar tissue removal 1996    reports that he has never smoked. He does not have any smokeless tobacco history on file. He reports that he drinks alcohol. His drug history not on file. family history includes Diabetes in his maternal grandfather and maternal grandmother; Hyperlipidemia in his mother; Hypertension in his father and mother; and Sudden death in his father and maternal grandmother.  There is no history of Heart attack. No Known Allergies   Review of Systems see hpi     Objective:   Physical Exam  Physical Exam  Nursing note and vitals reviewed. Constitutional: He appears well-developed and well-nourished. No distress.  HENT:  Head: Normocephalic and atraumatic.  Right Ear: Tympanic membrane and external ear normal.  Left Ear: Tympanic membrane and external ear normal.  Nose: Nose normal.  Mouth/Throat: Uvula is midline, oropharynx is clear and moist and mucous membranes are normal. No oropharyngeal exudate.  Eyes: Conjunctivae and EOM are normal. Pupils are equal, round, and reactive to light. Right eye exhibits no discharge. Left eye exhibits no discharge. No scleral icterus.  Neck: Neck supple. Carotid bruit is not present. No thyromegaly present.  Cardiovascular: Normal rate, regular rhythm and normal heart sounds.  Exam reveals no gallop and no friction rub.   No murmur heard. Pulmonary/Chest: Effort normal and breath sounds normal. No respiratory distress. He has no wheezes. He has no rales.   Abdominal: Soft. He exhibits no distension and no mass. There is no hepatosplenomegaly. There is no tenderness. There is no rebound. Hernia confirmed negative in the right inguinal area and confirmed negative in the left inguinal area.     He has no cervical adenopathy.  Neurological: He is alert.  Skin: Skin is warm and dry. He is not diaphoretic.  Psychiatric: He has a normal mood and affect.        Assessment & Plan:

## 2012-03-08 NOTE — Assessment & Plan Note (Signed)
Nl exam. Obtain cpe labs including psa (pro's and cons discussed)

## 2012-04-25 DIAGNOSIS — H8090 Unspecified otosclerosis, unspecified ear: Secondary | ICD-10-CM | POA: Insufficient documentation

## 2012-12-04 ENCOUNTER — Ambulatory Visit (INDEPENDENT_AMBULATORY_CARE_PROVIDER_SITE_OTHER): Payer: BC Managed Care – PPO | Admitting: Sports Medicine

## 2012-12-04 ENCOUNTER — Encounter: Payer: Self-pay | Admitting: Emergency Medicine

## 2012-12-04 ENCOUNTER — Emergency Department
Admission: EM | Admit: 2012-12-04 | Discharge: 2012-12-04 | Disposition: A | Discharge status: Home / Self Care | Payer: BC Managed Care – PPO | Source: Home / Self Care | Attending: Family Medicine | Admitting: Family Medicine

## 2012-12-04 DIAGNOSIS — S46819A Strain of other muscles, fascia and tendons at shoulder and upper arm level, unspecified arm, initial encounter: Secondary | ICD-10-CM

## 2012-12-04 DIAGNOSIS — S43499A Other sprain of unspecified shoulder joint, initial encounter: Secondary | ICD-10-CM

## 2012-12-04 DIAGNOSIS — S46211A Strain of muscle, fascia and tendon of other parts of biceps, right arm, initial encounter: Secondary | ICD-10-CM

## 2012-12-04 DIAGNOSIS — S46219A Strain of muscle, fascia and tendon of other parts of biceps, unspecified arm, initial encounter: Secondary | ICD-10-CM | POA: Insufficient documentation

## 2012-12-04 DIAGNOSIS — S46111A Strain of muscle, fascia and tendon of long head of biceps, right arm, initial encounter: Secondary | ICD-10-CM

## 2012-12-04 MED ORDER — MELOXICAM 15 MG PO TABS: ORAL_TABLET | ORAL | Status: DC

## 2012-12-04 MED ORDER — TRAMADOL HCL 50 MG PO TABS: 50.0000 mg | ORAL_TABLET | Freq: Three times a day (TID) | ORAL | Status: DC | PRN

## 2012-12-04 NOTE — ED Notes (Signed)
While during curls this morning heard elbow pop, severe pain, broke out in a sweat

## 2012-12-04 NOTE — Assessment & Plan Note (Signed)
With extremely weak supination, as well as inability to hook the distal biceps tendon I am more concerned about a complete tear of the distal tendon. I've strapped the elbow with compressive bandage. Mobic for pain. Sling. Return in one to 2 weeks to let this calmed down, then I can scan versus consider MRI. He is a Licensed conveyancer, and I do suspect that there is a complete distal tear he will need surgical intervention.

## 2012-12-04 NOTE — ED Provider Notes (Signed)
History     CSN: [id number]  Arrival date & time 12/04/12  1014   First MD Initiated Contact with Patient 12/04/12 1016      Chief Complaint  Patient presents with  . Elbow Injury   HPI  Forearm pain x 1 day Pt was doing preacher curls with 30lb weight.  Was laying weights down when he felt a pop in R arm Has had severe biceps pain since this point.  No distal paresthesias or numbness.   Past Medical History  Diagnosis Date  . Ulcerative colitis   . Hearing loss     Right ear w/ hearing aid (new one in 2008)  . OAB (overactive bladder)     or BPH?    Past Surgical History  Procedure Laterality Date  . Rhinoplasty  1997  . Right ear surgery for scar tissue removal  1996    Family History  Problem Relation Age of Onset  . Hyperlipidemia Mother   . Hypertension Mother   . Hypertension Father   . Sudden death Father   . Sudden death Maternal Grandmother   . Diabetes Maternal Grandmother   . Diabetes Maternal Grandfather   . Heart attack Neg Hx     History  Substance Use Topics  . Smoking status: Never Smoker   . Smokeless tobacco: Not on file  . Alcohol Use: Yes     Comment: rare       Review of Systems  All other systems reviewed and are negative.    Allergies  Review of patient's allergies indicates not on file.  Home Medications   Current Outpatient Rx  Name  Route  Sig  Dispense  Refill  . loratadine (CLARITIN) 10 MG tablet   Oral   Take 10 mg by mouth as needed.          . mesalamine (LIALDA) 1.2 G EC tablet   Oral   Take by mouth. Take 4 tablets by mouth daily.         . Multiple Vitamin (MULTIVITAMIN) tablet   Oral   Take 1 tablet by mouth daily.           BP 117/76  Pulse 82  Temp(Src) 97.5 F (36.4 C) (Oral)  Ht 6' (1.829 m)  Wt 199 lb (90.266 kg)  BMI 26.98 kg/m2  SpO2 100%  Physical Exam  Constitutional: He appears well-developed and well-nourished.  HENT:  Head: Normocephalic and atraumatic.  Eyes:  Conjunctivae are normal. Pupils are equal, round, and reactive to light.  Neck: Normal range of motion.  Cardiovascular: Normal rate and regular rhythm.   Pulmonary/Chest: Effort normal and breath sounds normal.  Abdominal: Soft.  Musculoskeletal:       Arms: Neurological: He is alert.  Skin: Skin is warm.    ED Course  Procedures (including critical care time)  Labs Reviewed - No data to display No results found.   1. Biceps muscle strain, right, initial encounter       MDM  Concern for biceps rupture based on exam Will consult sports medicine for further management.  Treatment plan and follow up per sports medicine.      The patient and/or caregiver has been counseled thoroughly with regard to treatment plan and/or medications prescribed including dosage, schedule, interactions, rationale for use, and possible side effects and they verbalize understanding. Diagnoses and expected course of recovery discussed and will return if not improved as expected or if the condition worsens. Patient and/or caregiver verbalized understanding.  Doree Albee, MD 12/09/12 1229

## 2012-12-04 NOTE — Progress Notes (Signed)
   Subjective:    I'm seeing this patient as a consultation for:  Dr. 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  Muscle was in a state of eccentric contraction when this occurred.  He has had multiple muscle related issues including a gastrocnemius tear as well as biceps tendinitis, distal, on the contralateral side. He is on Lialda for ulcerative colitis but has not been on any flouroquinolones for years.  Pain is severe. He only has little pain at the proximal biceps.   Past medical history, Surgical history, Family history not pertinant except as noted below, Social history, Allergies, and medications have been entered into the medical record, reviewed, and no changes needed.   Review of Systems: No headache, visual changes, nausea, vomiting, diarrhea, constipation, dizziness, abdominal pain, skin rash, fevers, chills, night sweats, weight loss, swollen lymph nodes, body aches, joint swelling, muscle aches, chest pain, shortness of breath, mood changes, visual or auditory hallucinations.   Objective:   General: Well Developed, well nourished, and in no acute distress.  Neuro/Psych: Alert and oriented x3, extra-ocular muscles intact, able to move all 4 extremities, sensation grossly intact. Skin: Warm and dry, no rashes noted.  Respiratory: Not using accessory muscles, speaking in full sentences, trachea midline.  Cardiovascular: Pulses palpable, no extremity edema. Abdomen: Does not appear distended. Right Shoulder: Inspection reveals no abnormalities, atrophy or asymmetry. Palpation is normal with no tenderness over AC joint or bicipital groove. ROM is full in all planes. Rotator cuff strength normal throughout. No signs of impingement with negative Neer and Hawkin's tests, empty can sign. No labral pathology noted with negative Obrien's, negative clunk and good  stability. Normal scapular function observed. No painful arc and no drop arm sign. No apprehension sign. Speeds and Yergason tests are positive, there is exquisite pain with attempted supination, along with weakness. He also has pain with resisted flexion of the shoulder. Pain is localized to the elbow. I have difficulty hooking the distal biceps tendon with my finger. Elbow flexion strength remains good as expected.  Impression and Recommendations:   This case required medical decision making of moderate complexity.

## 2012-12-09 ENCOUNTER — Encounter: Payer: Self-pay | Admitting: Sports Medicine

## 2012-12-09 DIAGNOSIS — Z0289 Encounter for other administrative examinations: Secondary | ICD-10-CM

## 2012-12-09 NOTE — Progress Notes (Signed)
Attending physician statement/disability forms filled out, charge sheet filled out.

## 2012-12-12 ENCOUNTER — Encounter: Payer: Self-pay | Admitting: Sports Medicine

## 2012-12-12 ENCOUNTER — Telehealth: Payer: Self-pay | Admitting: *Deleted

## 2012-12-12 ENCOUNTER — Ambulatory Visit (INDEPENDENT_AMBULATORY_CARE_PROVIDER_SITE_OTHER): Payer: BC Managed Care – PPO | Admitting: Sports Medicine

## 2012-12-12 VITALS — BP 123/75 | HR 73 | Wt 195.0 lb

## 2012-12-12 DIAGNOSIS — S46111A Strain of muscle, fascia and tendon of long head of biceps, right arm, initial encounter: Secondary | ICD-10-CM

## 2012-12-12 DIAGNOSIS — S46819A Strain of other muscles, fascia and tendons at shoulder and upper arm level, unspecified arm, initial encounter: Secondary | ICD-10-CM

## 2012-12-12 DIAGNOSIS — S43499A Other sprain of unspecified shoulder joint, initial encounter: Secondary | ICD-10-CM

## 2012-12-12 NOTE — Telephone Encounter (Signed)
No precert required for pt's insurance for the MRI elbow.

## 2012-12-12 NOTE — Assessment & Plan Note (Signed)
Pain is markedly improved. Strength is improving, still very weak to supination. Exam is much better too, I can hook the biceps tendon with my hand distally. I still need an MRI. Return to go for MRI results. Biceps exercises.

## 2012-12-12 NOTE — Progress Notes (Signed)
  Subjective:    CC: Followup  HPI: Right distal biceps tear: Byrne has improved remarkably with compression and analgesics. His strength is improving, he still is a little bit weak to supination. There is no bruising, and no swelling, he is actually very happy with his results so far. The small amount of pain that he does have is when supinating the hand, palpation, but it is mild, improving, localized.   Past medical history, Surgical history, Family history not pertinant except as noted below, Social history, Allergies, and medications have been entered into the medical record, reviewed, and no changes needed.   Review of Systems: No fevers, chills, night sweats, weight loss, chest pain, or shortness of breath.   Objective:    General: Well Developed, well nourished, and in no acute distress.  Neuro: Alert and oriented x3, extra-ocular muscles intact, sensation grossly intact.  HEENT: Normocephalic, atraumatic, pupils equal round reactive to light, neck supple, no masses, no lymphadenopathy, thyroid nonpalpable.  Skin: Warm and dry, no rashes. Cardiac: Regular rate and rhythm, no murmurs rubs or gallops, no lower extremity edema.  Respiratory: Clear to auscultation bilaterally. Not using accessory muscles, speaking in full sentences. Right elbow: Excellent strength to flexion, still very weak with reproduction of pain on resisted supination. I am able to hook the biceps tendon distally with my finger. Speeds and Yergason tests reproduce pain at the anterior elbow but not the shoulder.  Impression and Recommendations:

## 2012-12-16 ENCOUNTER — Ambulatory Visit (INDEPENDENT_AMBULATORY_CARE_PROVIDER_SITE_OTHER): Payer: BC Managed Care – PPO

## 2012-12-16 DIAGNOSIS — M66329 Spontaneous rupture of flexor tendons, unspecified upper arm: Secondary | ICD-10-CM

## 2012-12-16 DIAGNOSIS — M25529 Pain in unspecified elbow: Secondary | ICD-10-CM

## 2012-12-19 ENCOUNTER — Encounter: Payer: Self-pay | Admitting: Sports Medicine

## 2012-12-19 ENCOUNTER — Ambulatory Visit (INDEPENDENT_AMBULATORY_CARE_PROVIDER_SITE_OTHER): Payer: BC Managed Care – PPO | Admitting: Sports Medicine

## 2012-12-19 VITALS — BP 123/70 | HR 70

## 2012-12-19 DIAGNOSIS — S46819A Strain of other muscles, fascia and tendons at shoulder and upper arm level, unspecified arm, initial encounter: Secondary | ICD-10-CM

## 2012-12-19 DIAGNOSIS — S46111A Strain of muscle, fascia and tendon of long head of biceps, right arm, initial encounter: Secondary | ICD-10-CM

## 2012-12-19 DIAGNOSIS — S43499A Other sprain of unspecified shoulder joint, initial encounter: Secondary | ICD-10-CM

## 2012-12-19 NOTE — Assessment & Plan Note (Signed)
Unfortunately, MRI does show a complete tear of the distal biceps tendon, with approximately 1.2 cm of retraction, and likely held in place by the aponeurosis. At this point I am going to keep him out of work, and I would like him to see Dr. Jones Broom with shoulder and elbow surgery at [facility] orthopedics.

## 2012-12-19 NOTE — Progress Notes (Signed)
  Subjective:    CC: MRI results  HPI: Right elbow: Injured while weight lifting, suspicion was for distal biceps tendon rupture. Here to followup MRI results. Overall pain-free, but still with weakness to supination, good strength to flexion of the elbow.  Past medical history, Surgical history, Family history not pertinant except as noted below, Social history, Allergies, and medications have been entered into the medical record, reviewed, and no changes needed.   Review of Systems: No fevers, chills, night sweats, weight loss, chest pain, or shortness of breath.   Objective:    General: Well Developed, well nourished, and in no acute distress.  Neuro: Alert and oriented x3, extra-ocular muscles intact, sensation grossly intact.  HEENT: Normocephalic, atraumatic, pupils equal round reactive to light, neck supple, no masses, no lymphadenopathy, thyroid nonpalpable.  Skin: Warm and dry, no rashes. Cardiac: Regular rate and rhythm, no murmurs rubs or gallops, no lower extremity edema.  Respiratory: Clear to auscultation bilaterally. Not using accessory muscles, speaking in full sentences. Right elbow: Excellent strength flexion, weak to supination.  MRI was reviewed, and she is a complete rupture of the distal biceps tendon held in place by the aponeurosis, and retracted approximately 1.2 cm. Impression and Recommendations:

## 2013-01-13 ENCOUNTER — Ambulatory Visit (INDEPENDENT_AMBULATORY_CARE_PROVIDER_SITE_OTHER): Payer: BC Managed Care – PPO | Admitting: Sports Medicine

## 2013-01-13 ENCOUNTER — Encounter: Payer: Self-pay | Admitting: Sports Medicine

## 2013-01-13 VITALS — BP 113/76 | HR 59 | Wt 196.8 lb

## 2013-01-13 DIAGNOSIS — S46111D Strain of muscle, fascia and tendon of long head of biceps, right arm, subsequent encounter: Secondary | ICD-10-CM

## 2013-01-13 DIAGNOSIS — M79669 Pain in unspecified lower leg: Secondary | ICD-10-CM

## 2013-01-13 DIAGNOSIS — M79609 Pain in unspecified limb: Secondary | ICD-10-CM

## 2013-01-13 DIAGNOSIS — Z5189 Encounter for other specified aftercare: Secondary | ICD-10-CM

## 2013-01-13 NOTE — Assessment & Plan Note (Signed)
Bilateral gastroc strain. Predominately medial head. Strapped both sides with compressive dressing. He will return for custom orthotics. Formal physical therapy.

## 2013-01-13 NOTE — Assessment & Plan Note (Signed)
Status post operative fixation. Doing well. Will followup with Dr. Ave Filter regarding this.

## 2013-01-13 NOTE — Progress Notes (Signed)
  Subjective:    CC: Followup  HPI: Right biceps tendon distal tear: Status post operative intervention. Doing well, followed by Dr. Ave Filter.  Bilateral calf pain: Has a history of multiple calf strains, more recently has started to notice increasing pain over the medial head of the gastrocnemius bilaterally. No trauma, no injuries, no swelling. No shortness of breath. Pain is localized and doesn't radiate, mild.  Past medical history, Surgical history, Family history not pertinant except as noted below, Social history, Allergies, and medications have been entered into the medical record, reviewed, and no changes needed.   Review of Systems: No fevers, chills, night sweats, weight loss, chest pain, or shortness of breath.   Objective:    General: Well Developed, well nourished, and in no acute distress.  Neuro: Alert and oriented x3, extra-ocular muscles intact, sensation grossly intact.  HEENT: Normocephalic, atraumatic, pupils equal round reactive to light, neck supple, no masses, no lymphadenopathy, thyroid nonpalpable.  Skin: Warm and dry, no rashes. Cardiac: Regular rate and rhythm, no murmurs rubs or gallops, no lower extremity edema.  Respiratory: Clear to auscultation bilaterally. Not using accessory muscles, speaking in full sentences. Bilateral Knee: Normal to inspection with no erythema or effusion or obvious bony abnormalities. Tender to palpation at the medial head of the gastroc bilaterally. ROM full in flexion and extension and lower leg rotation. Ligaments with solid consistent endpoints including ACL, PCL, LCL, MCL. Negative Mcmurray's, Apley's, and Thessalonian tests. Non painful patellar compression. Patellar glide without crepitus. Patellar and quadriceps tendons unremarkable. Hamstring and quadriceps strength is normal.  Negative Homans sign bilaterally  Both calves were strapped with compressive dressing.  I also placed a heel raise in both of his  shoes. Impression and Recommendations:

## 2013-01-15 ENCOUNTER — Ambulatory Visit (INDEPENDENT_AMBULATORY_CARE_PROVIDER_SITE_OTHER): Payer: BC Managed Care – PPO | Admitting: Physical Therapy

## 2013-01-15 ENCOUNTER — Ambulatory Visit (INDEPENDENT_AMBULATORY_CARE_PROVIDER_SITE_OTHER): Payer: BC Managed Care – PPO | Admitting: Sports Medicine

## 2013-01-15 ENCOUNTER — Encounter: Payer: Self-pay | Admitting: Sports Medicine

## 2013-01-15 VITALS — BP 120/80 | HR 66 | Wt 195.0 lb

## 2013-01-15 DIAGNOSIS — M25676 Stiffness of unspecified foot, not elsewhere classified: Secondary | ICD-10-CM

## 2013-01-15 DIAGNOSIS — M79609 Pain in unspecified limb: Secondary | ICD-10-CM

## 2013-01-15 DIAGNOSIS — M25673 Stiffness of unspecified ankle, not elsewhere classified: Secondary | ICD-10-CM

## 2013-01-15 DIAGNOSIS — M79669 Pain in unspecified lower leg: Secondary | ICD-10-CM

## 2013-01-15 DIAGNOSIS — M25569 Pain in unspecified knee: Secondary | ICD-10-CM

## 2013-01-15 NOTE — Assessment & Plan Note (Signed)
Custom orthotics with a heel lift placed. Return on an as-needed basis for this.

## 2013-01-15 NOTE — Progress Notes (Signed)
    Patient was fitted for a : standard, cushioned, semi-rigid orthotic. The orthotic was heated and afterward the patient stood on the orthotic blank positioned on the orthotic stand. The patient was positioned in subtalar neutral position and 10 degrees of ankle dorsiflexion in a weight bearing stance. After completion of molding, a stable base was applied to the orthotic blank. The blank was ground to a stable position for weight bearing. Size: 11 Base: Blue EVA Additional Posting and Padding: None The patient ambulated these, and they were very comfortable.  I spent 40 minutes with this patient, greater than 50% was face-to-face time counseling regarding the below diagnosis.   

## 2013-01-22 ENCOUNTER — Encounter: Payer: BC Managed Care – PPO | Admitting: Physical Therapy

## 2013-01-22 DIAGNOSIS — M25676 Stiffness of unspecified foot, not elsewhere classified: Secondary | ICD-10-CM

## 2013-01-22 DIAGNOSIS — M25569 Pain in unspecified knee: Secondary | ICD-10-CM

## 2013-01-22 DIAGNOSIS — M79609 Pain in unspecified limb: Secondary | ICD-10-CM

## 2013-01-22 DIAGNOSIS — M25673 Stiffness of unspecified ankle, not elsewhere classified: Secondary | ICD-10-CM

## 2013-02-04 ENCOUNTER — Ambulatory Visit: Payer: BC Managed Care – PPO | Admitting: Physical Therapy

## 2013-02-04 DIAGNOSIS — M25529 Pain in unspecified elbow: Secondary | ICD-10-CM

## 2013-02-04 DIAGNOSIS — M66329 Spontaneous rupture of flexor tendons, unspecified upper arm: Secondary | ICD-10-CM

## 2013-02-04 DIAGNOSIS — M6281 Muscle weakness (generalized): Secondary | ICD-10-CM

## 2013-02-04 DIAGNOSIS — M2560 Stiffness of unspecified joint, not elsewhere classified: Secondary | ICD-10-CM

## 2013-02-04 DIAGNOSIS — M25539 Pain in unspecified wrist: Secondary | ICD-10-CM

## 2013-02-06 ENCOUNTER — Encounter (INDEPENDENT_AMBULATORY_CARE_PROVIDER_SITE_OTHER): Payer: BC Managed Care – PPO | Admitting: Physical Therapy

## 2013-02-06 DIAGNOSIS — M25539 Pain in unspecified wrist: Secondary | ICD-10-CM

## 2013-02-06 DIAGNOSIS — M25529 Pain in unspecified elbow: Secondary | ICD-10-CM

## 2013-02-06 DIAGNOSIS — M2560 Stiffness of unspecified joint, not elsewhere classified: Secondary | ICD-10-CM

## 2013-02-06 DIAGNOSIS — M66329 Spontaneous rupture of flexor tendons, unspecified upper arm: Secondary | ICD-10-CM

## 2013-02-06 DIAGNOSIS — M6281 Muscle weakness (generalized): Secondary | ICD-10-CM

## 2013-02-11 ENCOUNTER — Ambulatory Visit: Payer: BC Managed Care – PPO | Attending: Orthopedic Surgery | Admitting: Physical Therapy

## 2013-02-18 ENCOUNTER — Encounter (INDEPENDENT_AMBULATORY_CARE_PROVIDER_SITE_OTHER): Payer: BC Managed Care – PPO | Admitting: Physical Therapy

## 2013-02-18 DIAGNOSIS — M2560 Stiffness of unspecified joint, not elsewhere classified: Secondary | ICD-10-CM

## 2013-02-18 DIAGNOSIS — M25539 Pain in unspecified wrist: Secondary | ICD-10-CM

## 2013-02-18 DIAGNOSIS — M6281 Muscle weakness (generalized): Secondary | ICD-10-CM

## 2013-02-18 DIAGNOSIS — M25529 Pain in unspecified elbow: Secondary | ICD-10-CM

## 2013-02-18 DIAGNOSIS — M66329 Spontaneous rupture of flexor tendons, unspecified upper arm: Secondary | ICD-10-CM

## 2013-02-25 ENCOUNTER — Encounter (INDEPENDENT_AMBULATORY_CARE_PROVIDER_SITE_OTHER): Payer: BC Managed Care – PPO | Admitting: Physical Therapy

## 2013-02-25 DIAGNOSIS — M6281 Muscle weakness (generalized): Secondary | ICD-10-CM

## 2013-02-25 DIAGNOSIS — M25539 Pain in unspecified wrist: Secondary | ICD-10-CM

## 2013-02-25 DIAGNOSIS — M25529 Pain in unspecified elbow: Secondary | ICD-10-CM

## 2013-02-25 DIAGNOSIS — M2560 Stiffness of unspecified joint, not elsewhere classified: Secondary | ICD-10-CM

## 2013-02-25 DIAGNOSIS — M66329 Spontaneous rupture of flexor tendons, unspecified upper arm: Secondary | ICD-10-CM

## 2013-03-05 ENCOUNTER — Encounter: Payer: BC Managed Care – PPO | Admitting: Internal Medicine

## 2013-12-08 ENCOUNTER — Encounter: Payer: Self-pay | Admitting: Sports Medicine

## 2013-12-08 ENCOUNTER — Ambulatory Visit (INDEPENDENT_AMBULATORY_CARE_PROVIDER_SITE_OTHER): Payer: BC Managed Care – PPO | Admitting: Sports Medicine

## 2013-12-08 VITALS — BP 135/85 | HR 67 | Ht 72.0 in | Wt 193.0 lb

## 2013-12-08 DIAGNOSIS — I498 Other specified cardiac arrhythmias: Secondary | ICD-10-CM

## 2013-12-08 DIAGNOSIS — N139 Obstructive and reflux uropathy, unspecified: Secondary | ICD-10-CM

## 2013-12-08 DIAGNOSIS — R001 Bradycardia, unspecified: Secondary | ICD-10-CM

## 2013-12-08 DIAGNOSIS — S46219A Strain of muscle, fascia and tendon of other parts of biceps, unspecified arm, initial encounter: Secondary | ICD-10-CM

## 2013-12-08 DIAGNOSIS — S060X0A Concussion without loss of consciousness, initial encounter: Secondary | ICD-10-CM | POA: Insufficient documentation

## 2013-12-08 DIAGNOSIS — Z0000 Encounter for general adult medical examination without abnormal findings: Secondary | ICD-10-CM

## 2013-12-08 DIAGNOSIS — K5190 Ulcerative colitis, unspecified, without complications: Secondary | ICD-10-CM

## 2013-12-08 LAB — COMPREHENSIVE METABOLIC PANEL WITH GFR
CO2: 29 meq/L (ref 19–32)
Calcium: 9.7 mg/dL (ref 8.4–10.5)
Chloride: 102 meq/L (ref 96–112)
Glucose, Bld: 86 mg/dL (ref 70–99)
Sodium: 138 meq/L (ref 135–145)
Total Bilirubin: 0.6 mg/dL (ref 0.2–1.2)
Total Protein: 7.1 g/dL (ref 6.0–8.3)

## 2013-12-08 LAB — LIPID PANEL
Cholesterol: 209 mg/dL — ABNORMAL HIGH (ref 0–200)
HDL: 46 mg/dL (ref >39)
LDL Cholesterol: 124 mg/dL — ABNORMAL HIGH (ref 0–99)
Total CHOL/HDL Ratio: 4.5 Ratio
Triglycerides: 195 mg/dL — ABNORMAL HIGH (ref <150)
VLDL: 39 mg/dL (ref 0–40)

## 2013-12-08 LAB — COMPREHENSIVE METABOLIC PANEL
ALT: 18 U/L (ref 0–53)
AST: 18 U/L (ref 0–37)
Albumin: 4.5 g/dL (ref 3.5–5.2)
Alkaline Phosphatase: 73 U/L (ref 39–117)
BUN: 13 mg/dL (ref 6–23)
Creat: 1.13 mg/dL (ref 0.50–1.35)
Potassium: 4.6 mEq/L (ref 3.5–5.3)

## 2013-12-08 LAB — HEMOGLOBIN A1C
Hgb A1c MFr Bld: 5.6 % (ref <5.7)
Mean Plasma Glucose: 114 mg/dL (ref <117)

## 2013-12-08 LAB — CBC
HCT: 43.8 % (ref 39.0–52.0)
Hemoglobin: 15.3 g/dL (ref 13.0–17.0)
MCH: 30.5 pg (ref 26.0–34.0)
MCHC: 34.9 g/dL (ref 30.0–36.0)
MCV: 87.3 fL (ref 78.0–100.0)
Platelets: 253 10*3/uL (ref 150–400)
RBC: 5.02 MIL/uL (ref 4.22–5.81)
RDW: 13.1 % (ref 11.5–15.5)
WBC: 4.3 10*3/uL (ref 4.0–10.5)

## 2013-12-08 LAB — TSH: TSH: 2.191 u[IU]/mL (ref 0.350–4.500)

## 2013-12-08 MED ORDER — SILODOSIN 4 MG PO CAPS: 4.0000 mg | ORAL_CAPSULE | Freq: Every day | ORAL | Status: DC

## 2013-12-08 NOTE — Assessment & Plan Note (Signed)
Completely asymptomatic during episode of heart rate at 49. This is likely physiologic, he is a English as a second language teacher.

## 2013-12-08 NOTE — Progress Notes (Signed)
  Subjective:    CC: Complete physical, discuss several issues  HPI:  Difficult urination: Present for years now, experiences dribbling, weak stream, having to go multiple times but not much through the night. He was seen by urologist in the distant past, and was placed on a medicine, he is unsure of the name of it. Did not have a good response. Denies any pain or burning, no constitutional symptoms.  Post right distal biceps repair, doing well, back in the gym.  Episode of bradycardia: Heart rate was 49 at an outside facility, he was asymptomatic during this time. Nonetheless, he was referred to a cardiologist at that time, EKG was negative. He's never been symptomatic.  Concussion: Several days ago was hit in the head by a rod at work, had some dizziness, was off balance, a few days later symptoms resolve. Currently he only gets mild headaches, he was sent by his company doctor for CT scan of the head which was negative.  Ulcerative colitis: Stable on current medications with gastroenterology.  Past medical history, Surgical history, Family history not pertinant except as noted below, Social history, Allergies, and medications have been entered into the medical record, reviewed, and no changes needed.   Review of Systems: No headache, visual changes, nausea, vomiting, diarrhea, constipation, dizziness, abdominal pain, skin rash, fevers, chills, night sweats, swollen lymph nodes, weight loss, chest pain, body aches, joint swelling, muscle aches, shortness of breath, mood changes, visual or auditory hallucinations.  Objective:    General: Well Developed, well nourished, and in no acute distress.  Neuro: Alert and oriented x3, extra-ocular muscles intact, sensation grossly intact.  HEENT: Normocephalic, atraumatic, pupils equal round reactive to light, neck supple, no masses, no lymphadenopathy, thyroid nonpalpable. Oropharynx, nasopharynx, external ear canals are unremarkable. Skin: Warm and  dry, no rashes noted.  Cardiac: Regular rate and rhythm, no murmurs rubs or gallops.  Respiratory: Clear to auscultation bilaterally. Not using accessory muscles, speaking in full sentences.  Abdominal: Soft, nontender, nondistended, positive bowel sounds, no masses, no organomegaly.  Musculoskeletal: Shoulder, elbow, wrist, hip, knee, ankle stable, and with full range of motion.  Impression and Recommendations:    The patient was counselled, risk factors were discussed, anticipatory guidance given.

## 2013-12-08 NOTE — Assessment & Plan Note (Signed)
Checking PSA, adding Rapaflo

## 2013-12-08 NOTE — Assessment & Plan Note (Signed)
Continue Lialda with gastroenterology.

## 2013-12-08 NOTE — Assessment & Plan Note (Signed)
Complete physical performed today. Checking routine blood work.

## 2013-12-08 NOTE — Assessment & Plan Note (Signed)
Doing well post distal biceps tendon repair.

## 2013-12-08 NOTE — Assessment & Plan Note (Signed)
Occurred at work, he is currently seeing his company doctor. If symptoms continue he will ask for a referral here for further concussion management.

## 2013-12-09 LAB — TESTOSTERONE: Testosterone: 410 ng/dL (ref 300–890)

## 2013-12-09 LAB — PSA, TOTAL AND FREE
PSA, Free Pct: 51 % (ref >25)
PSA, Free: 0.35 ng/mL
PSA: 0.69 ng/mL (ref <=4.00)

## 2014-06-22 ENCOUNTER — Encounter: Payer: Self-pay | Admitting: Sports Medicine

## 2014-06-22 ENCOUNTER — Ambulatory Visit (INDEPENDENT_AMBULATORY_CARE_PROVIDER_SITE_OTHER): Payer: BC Managed Care – PPO | Admitting: Sports Medicine

## 2014-06-22 VITALS — BP 136/80 | HR 56 | Ht 72.0 in | Wt 194.0 lb

## 2014-06-22 DIAGNOSIS — L739 Follicular disorder, unspecified: Secondary | ICD-10-CM

## 2014-06-22 DIAGNOSIS — N139 Obstructive and reflux uropathy, unspecified: Secondary | ICD-10-CM | POA: Diagnosis not present

## 2014-06-22 DIAGNOSIS — R079 Chest pain, unspecified: Secondary | ICD-10-CM | POA: Diagnosis not present

## 2014-06-22 LAB — CK: Total CK: 219 U/L (ref 7–232)

## 2014-06-22 MED ORDER — DOXYCYCLINE HYCLATE 100 MG PO TABS: 100.0000 mg | ORAL_TABLET | Freq: Two times a day (BID) | ORAL | Status: AC

## 2014-06-22 MED ORDER — MUPIROCIN 2 % EX OINT: TOPICAL_OINTMENT | CUTANEOUS | Status: DC

## 2014-06-22 NOTE — Assessment & Plan Note (Signed)
Failed 2 alpha blockers, referral to urology.

## 2014-06-22 NOTE — Assessment & Plan Note (Signed)
Obtained wound culture. Doxycycline and topical mupirocin.

## 2014-06-22 NOTE — Assessment & Plan Note (Signed)
Referral to cardiology considering family history. He will also return for pre-and postbronchodilator spirometry. Considering widespread muscle aches were also going to check a CK and ESR.

## 2014-06-22 NOTE — Progress Notes (Addendum)
  Subjective:    CC: Numerous issues  HPI: Skin rash: Chest, painful, also localized around the nostrils.  Chest pain: Mildly exertional, also associated with mild shortness of breath. He does have a strong family history of heart disease, chest pain does resolve with exertion. He is significantly into physical fitness, and is able to do intense exercise usually without chest pain.  Obstructive uropathy: No improvement despite two alpha blockers.  Past medical history, Surgical history, Family history not pertinant except as noted below, Social history, Allergies, and medications have been entered into the medical record, reviewed, and no changes needed.   Review of Systems: No fevers, chills, night sweats, weight loss, chest pain, or shortness of breath.   Objective:    General: Well Developed, well nourished, and in no acute distress.  Neuro: Alert and oriented x3, extra-ocular muscles intact, sensation grossly intact.  HEENT: Normocephalic, atraumatic, pupils equal round reactive to light, neck supple, no masses, no lymphadenopathy, thyroid nonpalpable.  Skin: Warm and dry, multiple areas of folliculitis on the chest. After cleaning with chlorhexidine I used an 18-gauge needle pierced one of the lesions, we then took a wound culture. Cardiac: Regular rate and rhythm, no murmurs rubs or gallops, no lower extremity edema.  Respiratory: Clear to auscultation bilaterally. Not using accessory muscles, speaking in full sentences.  Impression and Recommendations:    I spent 40 minutes with this patient, greater than 50% was face-to-face time counseling regarding the multiple above diagnoses.

## 2014-06-23 LAB — SEDIMENTATION RATE: Sed Rate: 1 mm/hr (ref 0–16)

## 2014-06-25 LAB — WOUND CULTURE
Gram Stain: NONE SEEN
Gram Stain: NONE SEEN
Organism ID, Bacteria: NO GROWTH

## 2014-07-14 ENCOUNTER — Telehealth: Payer: Self-pay | Admitting: *Deleted

## 2014-07-14 MED ORDER — SULFAMETHOXAZOLE-TRIMETHOPRIM 800-160 MG PO TABS: 1.0000 | ORAL_TABLET | Freq: Two times a day (BID) | ORAL | Status: DC

## 2014-07-14 NOTE — Telephone Encounter (Signed)
Message left and voicemail left for patient to call regarding script and appt.

## 2014-07-14 NOTE — Telephone Encounter (Signed)
Let's do another course of the oral antibiotic, we will use Septra this time. If still present we can biopsy the rash to ensure we are treating the right thing.

## 2014-07-14 NOTE — Telephone Encounter (Signed)
Authur calls this morning that the rash has returned on his chest that he you prescribed him doxycycline and he finished this on 12/1. He is using the cream but he wasn't sure if he needed the oral med or not. He said that it is mostly contained on his chest but it is also on his legs but not speading anywhere else. Please advise. Margette Fast, CMa

## 2014-07-30 HISTORY — PX: BICEPS TENDON REPAIR: SHX566

## 2014-08-04 ENCOUNTER — Ambulatory Visit (INDEPENDENT_AMBULATORY_CARE_PROVIDER_SITE_OTHER): Payer: BLUE CROSS/BLUE SHIELD | Admitting: Sports Medicine

## 2014-08-04 ENCOUNTER — Other Ambulatory Visit: Payer: BC Managed Care – PPO | Admitting: Sports Medicine

## 2014-08-04 ENCOUNTER — Encounter: Payer: Self-pay | Admitting: Sports Medicine

## 2014-08-04 ENCOUNTER — Ambulatory Visit (INDEPENDENT_AMBULATORY_CARE_PROVIDER_SITE_OTHER): Payer: BLUE CROSS/BLUE SHIELD

## 2014-08-04 ENCOUNTER — Telehealth: Payer: Self-pay | Admitting: *Deleted

## 2014-08-04 VITALS — BP 131/87 | HR 90 | Ht 77.0 in | Wt 198.0 lb

## 2014-08-04 DIAGNOSIS — R059 Cough, unspecified: Secondary | ICD-10-CM

## 2014-08-04 DIAGNOSIS — J90 Pleural effusion, not elsewhere classified: Secondary | ICD-10-CM | POA: Diagnosis not present

## 2014-08-04 DIAGNOSIS — R0989 Other specified symptoms and signs involving the circulatory and respiratory systems: Secondary | ICD-10-CM | POA: Diagnosis not present

## 2014-08-04 DIAGNOSIS — J189 Pneumonia, unspecified organism: Secondary | ICD-10-CM | POA: Insufficient documentation

## 2014-08-04 DIAGNOSIS — K769 Liver disease, unspecified: Secondary | ICD-10-CM

## 2014-08-04 DIAGNOSIS — R509 Fever, unspecified: Secondary | ICD-10-CM

## 2014-08-04 DIAGNOSIS — R05 Cough: Secondary | ICD-10-CM

## 2014-08-04 DIAGNOSIS — K7689 Other specified diseases of liver: Secondary | ICD-10-CM | POA: Diagnosis not present

## 2014-08-04 DIAGNOSIS — R6889 Other general symptoms and signs: Secondary | ICD-10-CM

## 2014-08-04 LAB — POCT INFLUENZA A/B: Influenza A, POC: NEGATIVE

## 2014-08-04 MED ORDER — HYDROCOD POLST-CHLORPHEN POLST 10-8 MG/5ML PO LQCR: 5.0000 mL | Freq: Two times a day (BID) | ORAL | Status: DC | PRN

## 2014-08-04 MED ORDER — KETOROLAC TROMETHAMINE 30 MG/ML IJ SOLN
30.0000 mg | Freq: Once | INTRAMUSCULAR | Status: AC
  Administered 2014-08-04: 30 mg via INTRAMUSCULAR

## 2014-08-04 MED ORDER — AZITHROMYCIN 250 MG PO TABS: ORAL_TABLET | ORAL | Status: DC

## 2014-08-04 NOTE — Progress Notes (Signed)
  Subjective:    CC: Sick feeling   HPI: For the past 4 days Benjamin Leonard as noted increasing fevers, chills, muscle aches, body aches, fatigue, cough productive of yellowish sputum, no shortness of breath, no GI symptoms and no skin rash. No chest pain. Symptoms are severe, persistent.  Past medical history, Surgical history, Family history not pertinant except as noted below, Social history, Allergies, and medications have been entered into the medical record, reviewed, and no changes needed.   Review of Systems: No fevers, chills, night sweats, weight loss, chest pain, or shortness of breath.   Objective:    General: Well Developed, well nourished, and in no acute distress.  Neuro: Alert and oriented x3, extra-ocular muscles intact, sensation grossly intact.  HEENT: Normocephalic, atraumatic, pupils equal round reactive to light, neck supple, no masses, no lymphadenopathy, thyroid nonpalpable. Oropharynx, nasopharynx, ear canals are unremarkable, no tenderness to palpation over the sinuses. Skin: Warm and dry, no rashes. Cardiac: Regular rate and rhythm, no murmurs rubs or gallops, no lower extremity edema.  Respiratory: Coarse sounds in the right lung fields. Not using accessory muscles, speaking in full sentences.  Rapid flu test is negative.  Chest x-ray does show blunting of the right cardiophrenic angle.  Impression and Recommendations:

## 2014-08-04 NOTE — Assessment & Plan Note (Signed)
With systemic malaise and myalgias. Flu asked is negative. He is out of the window for Tamiflu. His lung exam is concerning for right-sided pneumonia. Considering severe discomfort at this time we are going to give Toradol 30 mg intramuscular. Azithromycin, Tussionex, chest x-ray. Out of work for 3 days.  Return to see me if not starting to get better in a week.

## 2014-08-04 NOTE — Assessment & Plan Note (Signed)
Chest x-ray does show abnormal blunting of the right cardiophrenic angle. This does raise the suspicion of effusion versus atelectasis. I'm going to obtain a CT scan with IV contrast for further evaluation.

## 2014-08-04 NOTE — Progress Notes (Signed)
Patient has been informed that a CT was ordered. Kyah Buesing,CMA

## 2014-08-04 NOTE — Telephone Encounter (Signed)
No prior auth required for CT chest and radiology notified.

## 2014-08-05 ENCOUNTER — Ambulatory Visit (INDEPENDENT_AMBULATORY_CARE_PROVIDER_SITE_OTHER): Payer: BLUE CROSS/BLUE SHIELD

## 2014-08-05 DIAGNOSIS — J189 Pneumonia, unspecified organism: Secondary | ICD-10-CM

## 2014-08-05 DIAGNOSIS — J90 Pleural effusion, not elsewhere classified: Secondary | ICD-10-CM

## 2014-08-05 DIAGNOSIS — K769 Liver disease, unspecified: Secondary | ICD-10-CM

## 2014-08-05 MED ORDER — IOHEXOL 300 MG/ML  SOLN: 100.0000 mL | Freq: Once | INTRAMUSCULAR | Status: AC | PRN

## 2014-08-06 DIAGNOSIS — D1803 Hemangioma of intra-abdominal structures: Secondary | ICD-10-CM | POA: Insufficient documentation

## 2014-08-06 NOTE — Assessment & Plan Note (Signed)
Incidentally noted on CT scan. Awaiting call from patient as to whether he would like to proceed with liver protocol MRI with contrast.

## 2014-08-09 NOTE — Assessment & Plan Note (Signed)
Again, incidentally noted on CT scan, we are going to proceed with an MRI with and without contrast of the abdomen, with liver protocol.

## 2014-08-12 ENCOUNTER — Telehealth: Payer: Self-pay | Admitting: *Deleted

## 2014-08-12 NOTE — Telephone Encounter (Signed)
US imaging approval for liver w/wo (MRI abd w/wo) to be done at [facility].

## 2014-08-18 ENCOUNTER — Ambulatory Visit (INDEPENDENT_AMBULATORY_CARE_PROVIDER_SITE_OTHER): Payer: BLUE CROSS/BLUE SHIELD | Admitting: Cardiology

## 2014-08-18 ENCOUNTER — Encounter: Payer: Self-pay | Admitting: Cardiology

## 2014-08-18 ENCOUNTER — Encounter: Payer: Self-pay | Admitting: *Deleted

## 2014-08-18 VITALS — BP 132/92 | HR 63 | Ht 72.0 in | Wt 199.0 lb

## 2014-08-18 DIAGNOSIS — R030 Elevated blood-pressure reading, without diagnosis of hypertension: Secondary | ICD-10-CM

## 2014-08-18 DIAGNOSIS — R0602 Shortness of breath: Secondary | ICD-10-CM

## 2014-08-18 DIAGNOSIS — IMO0001 Reserved for inherently not codable concepts without codable children: Secondary | ICD-10-CM

## 2014-08-18 DIAGNOSIS — R079 Chest pain, unspecified: Secondary | ICD-10-CM

## 2014-08-18 NOTE — Assessment & Plan Note (Signed)
Patient states his diastolic blood pressure is running in the 90s. I have asked him to track this at home. He will purchase a blood pressure cuff and follow his blood pressure at home. If elevated he will follow up with primary care to initiate low-dose antihypertensive.

## 2014-08-18 NOTE — Progress Notes (Signed)
     HPI: 49 year old male for evaluation of chest pain. Chest CT January 2016 showed left upper lobe pneumonia. There were liver nodules and hepatic protocol MRI or liver ultrasound recommended. For the last several months the patient has noticed mild increased dyspnea with more vigorous activities. No orthopnea, PND, pedal edema or syncope. He occasionally feels a fullness in his chest. Occurs more in the morning. Last several seconds to minutes and resolves. No radiation or associated symptoms. Not exertional.  Current Outpatient Prescriptions  Medication Sig Dispense Refill  . mesalamine (LIALDA) 1.2 G EC tablet Take by mouth. Take 4 tablets by mouth daily.    . Multiple Vitamin (MULTIVITAMIN) tablet Take 1 tablet by mouth daily.     No current facility-administered medications for this visit.    No Known Allergies   Past Medical History  Diagnosis Date  . Ulcerative colitis   . Hearing loss     Right ear w/ hearing aid (new one in 2008)  . OAB (overactive bladder)     or BPH?  Marland Kitchen Borderline hypertension     Past Surgical History  Procedure Laterality Date  . Rhinoplasty  1997  . Right ear surgery for scar tissue removal  1996    History   Social History  . Marital Status: Married    Spouse Name: [name]    Number of Children: 2  . Years of Education: N/A   Occupational History  . 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 Smoking status: Never Smoker   . Smokeless tobacco: Not on file  . Alcohol Use: 0.0 oz/week    0 Not specified per week     Comment: rare   . Drug Use: No  . Sexual Activity: Not on file   Other Topics Concern  . Not on file   Social History Narrative   Moved from [place] in 2008. Mom lives with them.   Regular exercise:  Works out 3 days a week.   2 children   69 and 7   Daughter 80 is type I diabetic      Mother has platelet disorder (thrombocytosis)      Maternal grand parents - DM II    Family  History  Problem Relation Age of Onset  . Hyperlipidemia Mother   . Hypertension Mother   . Hypertension Father   . Sudden death Father   . Sudden death Maternal Grandmother   . Diabetes Maternal Grandmother   . Diabetes Maternal Grandfather   . Heart attack Neg Hx   . Hypertension Brother   . Arrhythmia Brother     ROS: no fevers or chills, productive cough, hemoptysis, dysphasia, odynophagia, melena, hematochezia, dysuria, hematuria, rash, seizure activity, orthopnea, PND, pedal edema, claudication. Remaining systems are negative.  Physical Exam:   Blood pressure 132/92, pulse 63, height 6' (1.829 m), weight 199 lb (90.266 kg).  General:  Well developed/well nourished in NAD Skin warm/dry Patient not depressed No peripheral clubbing Back-normal HEENT-normal/normal eyelids Neck supple/normal carotid upstroke bilaterally; no bruits; no JVD; no thyromegaly chest - CTA/ normal expansion CV - RRR/normal S1 and S2; no murmurs, rubs or gallops;  PMI nondisplaced Abdomen -NT/ND, no HSM, no mass, + bowel sounds, no bruit 2+ femoral pulses, no bruits Ext-no edema, chords, 2+ DP Neuro-grossly nonfocal  ECG normal sinus rhythm, right axis deviation, no ST changes.

## 2014-08-18 NOTE — Patient Instructions (Signed)
Your physician recommends that you schedule a follow-up appointment in: as needed pending test results  Your physician has requested that you have an echocardiogram. Echocardiography is a painless test that uses sound waves to create images of your heart. It provides your doctor with information about the size and shape of your heart and how well your heart's chambers and valves are working. This procedure takes approximately one hour. There are no restrictions for this procedure.   Your physician has requested that you have an exercise tolerance test. For further information please visit [email or web address]. Please also follow instruction sheet, as given.    Exercise Stress Electrocardiogram An exercise stress electrocardiogram is a test to check how blood flows to your heart. It is done to find areas of poor blood flow. You will need to walk on a treadmill for this test. The electrocardiogram will record your heartbeat when you are at rest and when you are exercising. BEFORE THE PROCEDURE  Do not have drinks with caffeine or foods with caffeine for 24 hours before the test, or as told by your doctor. This includes coffee, tea (even decaf tea), sodas, chocolate, and cocoa.  Follow your doctor's instructions about eating and drinking before the test.  Ask your doctor what medicines you should or should not take before the test. Take your medicines with water unless told by your doctor not to.  If you use an inhaler, bring it with you to the test.  Bring a snack to eat after the test.  Do not  smoke for 4 hours before the test.  Do not put lotions, powders, creams, or oils on your chest before the test.  Wear comfortable shoes and clothing. PROCEDURE  You will have patches put on your chest. Small areas of your chest may need to be shaved. Wires will be connected to the patches.  Your heart rate will be watched while you are resting and while you are exercising.  You will walk on the  treadmill. The treadmill will slowly get faster to raise your heart rate.  The test will take about 1-2 hours. AFTER THE PROCEDURE  Your heart rate and blood pressure will be watched after the test.  You may return to your normal diet, activities, and medicines or as told by your doctor. Document Released: 01/02/2008 Document Revised: 11/30/2013 Document Reviewed: 03/23/2013 [facility] Patient Information 2015 [place], [place]. This information is not intended to replace advice given to you by your health care provider. Make sure you discuss any questions you have with your health care provider.

## 2014-08-18 NOTE — Assessment & Plan Note (Signed)
Patient complains of dyspnea with exertion. Schedule exercise treadmill for risk stratification and echocardiogram to assess LV function.

## 2014-08-18 NOTE — Assessment & Plan Note (Signed)
Symptoms are atypical. Schedule exercise treadmill for risk stratification.

## 2014-09-01 ENCOUNTER — Telehealth: Payer: Self-pay | Admitting: Emergency Medicine

## 2014-09-01 NOTE — Telephone Encounter (Signed)
Patient called at [phone] requesting results of liver study done 08/26/14 at [facility]; states dropped of disc of exam for Dr.T; has been calling us and imaging center trying to get results, which they say have been sent to [place]. I spoke with medical records at [facility] and they will do a stat fax now.

## 2014-09-02 ENCOUNTER — Telehealth: Payer: Self-pay

## 2014-09-02 NOTE — Telephone Encounter (Signed)
error 

## 2014-09-02 NOTE — Telephone Encounter (Signed)
Called patient this morning but got his vm, i left a detailed message on patient mvm that MRI was negative. Cathern Tahir,CMA

## 2014-09-03 ENCOUNTER — Encounter: Payer: Self-pay | Admitting: Sports Medicine

## 2014-09-03 ENCOUNTER — Ambulatory Visit (INDEPENDENT_AMBULATORY_CARE_PROVIDER_SITE_OTHER): Payer: BLUE CROSS/BLUE SHIELD | Admitting: Sports Medicine

## 2014-09-03 VITALS — BP 129/82 | HR 82 | Wt 196.0 lb

## 2014-09-03 DIAGNOSIS — D1803 Hemangioma of intra-abdominal structures: Secondary | ICD-10-CM | POA: Diagnosis not present

## 2014-09-03 DIAGNOSIS — J181 Lobar pneumonia, unspecified organism: Principal | ICD-10-CM

## 2014-09-03 DIAGNOSIS — J189 Pneumonia, unspecified organism: Secondary | ICD-10-CM | POA: Diagnosis not present

## 2014-09-03 DIAGNOSIS — K449 Diaphragmatic hernia without obstruction or gangrene: Secondary | ICD-10-CM

## 2014-09-03 MED ORDER — ESOMEPRAZOLE MAGNESIUM 40 MG PO CPDR: DELAYED_RELEASE_CAPSULE | ORAL | Status: DC

## 2014-09-03 NOTE — Progress Notes (Signed)
  Subjective:    CC: Follow-up  HPI: Left upper lobe pneumonia: Resolved with antibiotics.  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  Chest tightness: Has an echocardiogram and a stress test coming up.  Hiatal hernia: This is the likely cause of his chest tightness, he is amenable to restart a PPI.  Past medical history, Surgical history, Family history not pertinant except as noted below, Social history, Allergies, and medications have been entered into the medical record, reviewed, and no changes needed.   Review of Systems: No fevers, chills, night sweats, weight loss, chest pain, or shortness of breath.   Objective:    General: Well Developed, well nourished, and in no acute distress.  Neuro: Alert and oriented x3, extra-ocular muscles intact, sensation grossly intact.  HEENT: Normocephalic, atraumatic, pupils equal round reactive to light, neck supple, no masses, no lymphadenopathy, thyroid nonpalpable.  Skin: Warm and dry, no rashes. Cardiac: Regular rate and rhythm, no murmurs rubs or gallops, no lower extremity edema.  Respiratory: Clear to auscultation bilaterally. Not using accessory muscles, speaking in full sentences.  Impression and Recommendations:

## 2014-09-03 NOTE — Assessment & Plan Note (Signed)
This is the most likely cause of his chest fullness. He is undergoing a cardiac workup, he will also start Nexium.

## 2014-09-03 NOTE — Assessment & Plan Note (Signed)
Left upper lobe pneumonia, symptoms have resolved and exam is benign.

## 2014-09-03 NOTE — Assessment & Plan Note (Signed)
Incidentally noted on CT and subsequently confirmed as benign on MRI.

## 2014-09-09 ENCOUNTER — Telehealth (HOSPITAL_COMMUNITY): Payer: Self-pay

## 2014-09-09 NOTE — Telephone Encounter (Signed)
Encounter complete. 

## 2014-09-14 ENCOUNTER — Ambulatory Visit (HOSPITAL_COMMUNITY)
Admission: RE | Admit: 2014-09-14 | Discharge: 2014-09-14 | Disposition: A | Discharge status: Home / Self Care | Payer: BLUE CROSS/BLUE SHIELD | Source: Ambulatory Visit | Attending: Cardiology | Admitting: Cardiology

## 2014-09-14 DIAGNOSIS — I10 Essential (primary) hypertension: Secondary | ICD-10-CM | POA: Diagnosis not present

## 2014-09-14 DIAGNOSIS — R079 Chest pain, unspecified: Secondary | ICD-10-CM | POA: Insufficient documentation

## 2014-09-14 DIAGNOSIS — R0609 Other forms of dyspnea: Secondary | ICD-10-CM | POA: Insufficient documentation

## 2014-09-14 NOTE — Procedures (Signed)
Exercise Treadmill Test   Test  Exercise Tolerance Test Ordering MD: Kirk Ruths, MD  Interpreting MD:   Unique Test No: 1 Treadmill:  1  Indication for ETT: chest pain - rule out ischemia  Contraindication to ETT: No   Stress Modality: exercise - treadmill  Cardiac Imaging Performed: non   Protocol: standard Bruce - maximal  Max BP:  162/71  Max MPHR (bpm):  170 85% MPR (bpm):  145  MPHR obtained (bpm):  173 % MPHR obtained:101  Reached 85% MPHR (min:sec):  10:05 Total Exercise Time (min-sec): 14:00  Workload in METS: 17.20 Borg Scale:   Reason ETT Terminated:  dyspnea    ST Segment Analysis At Rest: normal ST segments - no evidence of significant ST depression With Exercise: no evidence of significant ST depression  Other Information Arrhythmia:  No Angina during ETT:  absent (0) Quality of ETT:  diagnostic  ETT Interpretation:  normal - no evidence of ischemia by ST analysis  Comments: Excellent exercise tolerance Normal BP response to exercise No chest pain  Pixie Casino, MD, [facility] Attending Cardiologist [place]

## 2014-09-14 NOTE — Progress Notes (Signed)
2D Echo Performed 09/14/2014    Marygrace Drought, RCS

## 2014-09-15 ENCOUNTER — Encounter: Payer: Self-pay | Admitting: Sports Medicine

## 2014-09-30 ENCOUNTER — Ambulatory Visit (INDEPENDENT_AMBULATORY_CARE_PROVIDER_SITE_OTHER): Payer: BLUE CROSS/BLUE SHIELD

## 2014-09-30 ENCOUNTER — Encounter: Payer: Self-pay | Admitting: Sports Medicine

## 2014-09-30 ENCOUNTER — Ambulatory Visit (INDEPENDENT_AMBULATORY_CARE_PROVIDER_SITE_OTHER): Payer: BLUE CROSS/BLUE SHIELD | Admitting: Sports Medicine

## 2014-09-30 VITALS — BP 127/82 | HR 64 | Ht 72.0 in | Wt 194.0 lb

## 2014-09-30 DIAGNOSIS — M48061 Spinal stenosis, lumbar region without neurogenic claudication: Secondary | ICD-10-CM | POA: Insufficient documentation

## 2014-09-30 DIAGNOSIS — M5116 Intervertebral disc disorders with radiculopathy, lumbar region: Secondary | ICD-10-CM

## 2014-09-30 DIAGNOSIS — M5416 Radiculopathy, lumbar region: Secondary | ICD-10-CM

## 2014-09-30 DIAGNOSIS — M5417 Radiculopathy, lumbosacral region: Secondary | ICD-10-CM | POA: Diagnosis not present

## 2014-09-30 MED ORDER — PREDNISONE 50 MG PO TABS: ORAL_TABLET | ORAL | Status: DC

## 2014-09-30 NOTE — Progress Notes (Signed)
  Subjective:    CC: right leg pain  HPI: This is a pleasant 49 year old male, for a long time he's had recurrent pain in his right calf, had several diagnosed strains, more recently he developed pain on the anterolateral aspect of his right calf, he also tells me he's had chronic back pain.  Symptoms are moderate, persistent.  Past medical history, Surgical history, Family history not pertinant except as noted below, Social history, Allergies, and medications have been entered into the medical record, reviewed, and no changes needed.   Review of Systems: No fevers, chills, night sweats, weight loss, chest pain, or shortness of breath.   Objective:    General: Well Developed, well nourished, and in no acute distress.  Neuro: Alert and oriented x3, extra-ocular muscles intact, sensation grossly intact.  HEENT: Normocephalic, atraumatic, pupils equal round reactive to light, neck supple, no masses, no lymphadenopathy, thyroid nonpalpable.  Skin: Warm and dry, no rashes. Cardiac: Regular rate and rhythm, no murmurs rubs or gallops, no lower extremity edema.  Respiratory: Clear to auscultation bilaterally. Not using accessory muscles, speaking in full sentences. Right leg: Not tender to palpation, negative Homans sign, no visible swelling.  Impression and Recommendations:

## 2014-09-30 NOTE — Assessment & Plan Note (Signed)
Right L5. X-rays, home rehabilitation exercises, prednisone for 5 days. Return in a month, MRI for interventional if no better.

## 2015-01-17 ENCOUNTER — Telehealth: Payer: Self-pay | Admitting: Sports Medicine

## 2015-01-17 NOTE — Telephone Encounter (Signed)
Patient called clinic this am requesting an appt with PCP. Pt states yesterday (01/16/15) he "strained his ACL" (on left leg) while doing aerobics. Pt states his right leg has given him problems for some time, and over the last couple weeks his left leg has been giving him problems as well. Since the incident yesterday Pt has been limping, feels a burning sensation, and has had some swelling. Pt currently has ankle in an ace wrap, which doesn't provide much relief. Pt was added onto first available appt (01/20/15). Pt requested I route to PCP to see if there was a recommendation to do while he waits or if this is something that another provider in the office could treat with a sooner available appt. Will route to PCP for review.

## 2015-01-17 NOTE — Telephone Encounter (Signed)
Pt notified of appt and will be here in the am. Very grateful to get an earlier appt.

## 2015-01-17 NOTE — Telephone Encounter (Signed)
May book in [place].

## 2015-01-18 ENCOUNTER — Ambulatory Visit: Payer: BLUE CROSS/BLUE SHIELD | Admitting: Sports Medicine

## 2015-01-18 ENCOUNTER — Ambulatory Visit (INDEPENDENT_AMBULATORY_CARE_PROVIDER_SITE_OTHER): Payer: BLUE CROSS/BLUE SHIELD | Admitting: Sports Medicine

## 2015-01-18 ENCOUNTER — Encounter: Payer: Self-pay | Admitting: Sports Medicine

## 2015-01-18 VITALS — BP 123/78 | HR 57 | Ht 72.0 in | Wt 184.0 lb

## 2015-01-18 DIAGNOSIS — M7662 Achilles tendinitis, left leg: Secondary | ICD-10-CM | POA: Insufficient documentation

## 2015-01-18 MED ORDER — NITROGLYCERIN 0.2 MG/HR TD PT24: MEDICATED_PATCH | TRANSDERMAL | Status: DC

## 2015-01-18 NOTE — Progress Notes (Signed)
Patient came into clinic to be fitted for a walking boot. Pt was seen for an appt today with Dr. Darene Lamer and thought he had a boot at home. Pt was unable to find that boot, so he was fitted with a new one today. Pt was instructed on application and removal of boot and how to adjust air for comfort. Pt also needed a work note, this was written by Dr. Darene Lamer and given to Pt prior to leaving office. No further questions/concerns.

## 2015-01-18 NOTE — Progress Notes (Signed)
  Subjective:    CC: Left ankle pain  HPI: For the past month this pleasant 49 year old male has had increasing pain he localizes along the mid Achilles of his left ankle, he has already suffered biceps tendinitis, tear, as well as rotator cuff tendinitis. Symptoms are moderate, persistent without radiation. There worst when doing plyometric's.  Past medical history, Surgical history, Family history not pertinant except as noted below, Social history, Allergies, and medications have been entered into the medical record, reviewed, and no changes needed.   Review of Systems: No fevers, chills, night sweats, weight loss, chest pain, or shortness of breath.   Objective:    General: Well Developed, well nourished, and in no acute distress.  Neuro: Alert and oriented x3, extra-ocular muscles intact, sensation grossly intact.  HEENT: Normocephalic, atraumatic, pupils equal round reactive to light, neck supple, no masses, no lymphadenopathy, thyroid nonpalpable.  Skin: Warm and dry, no rashes. Cardiac: Regular rate and rhythm, no murmurs rubs or gallops, no lower extremity edema.  Respiratory: Clear to auscultation bilaterally. Not using accessory muscles, speaking in full sentences. Left Ankle: No visible erythema or swelling. Range of motion is full in all directions. Strength is 5/5 in all directions. Stable lateral and medial ligaments; squeeze test and kleiger test unremarkable; Talar dome nontender; No pain at base of 5th MT; No tenderness over cuboid; No tenderness over N spot or navicular prominence No tenderness on posterior aspects of lateral and medial malleolus No sign of peroneal tendon subluxations; Negative tarsal tunnel tinel's Visible swelling, fusiform over the mid Achilles with tenderness, and a negative Thompson's test.  Impression and Recommendations:

## 2015-01-18 NOTE — Assessment & Plan Note (Signed)
CAM boot as above

## 2015-01-18 NOTE — Assessment & Plan Note (Signed)
Heel lifts, nitroglycerin patches, formal physical therapy. Patient thinks he has a boot at home which he will wear for the first 2 weeks. If he doesn't have a boot he can come back for a nurse visit to be fit for one. Return to see me in one month.

## 2015-01-20 ENCOUNTER — Ambulatory Visit: Payer: BLUE CROSS/BLUE SHIELD | Admitting: Sports Medicine

## 2015-01-21 ENCOUNTER — Ambulatory Visit (INDEPENDENT_AMBULATORY_CARE_PROVIDER_SITE_OTHER): Payer: BLUE CROSS/BLUE SHIELD | Admitting: Physical Therapy

## 2015-01-21 ENCOUNTER — Encounter: Payer: Self-pay | Admitting: Physical Therapy

## 2015-01-21 DIAGNOSIS — M25572 Pain in left ankle and joints of left foot: Secondary | ICD-10-CM | POA: Diagnosis not present

## 2015-01-21 DIAGNOSIS — M259 Joint disorder, unspecified: Secondary | ICD-10-CM

## 2015-01-21 DIAGNOSIS — R269 Unspecified abnormalities of gait and mobility: Secondary | ICD-10-CM

## 2015-01-21 DIAGNOSIS — M25672 Stiffness of left ankle, not elsewhere classified: Secondary | ICD-10-CM | POA: Diagnosis not present

## 2015-01-21 DIAGNOSIS — R29898 Other symptoms and signs involving the musculoskeletal system: Secondary | ICD-10-CM

## 2015-01-21 NOTE — Therapy (Signed)
[place], [place], [address] Phone: [phone]   Fax:  [phone]  Physical Therapy Evaluation  Patient Details  Name: Benjamin Leonard MRN: [id number] Date of Birth: [birth year] Referring Provider:  Silverio Decamp,*  Encounter Date: 01/21/2015      PT End of Session - 01/21/15 1340    Visit Number 1   Number of Visits 4   Date for PT Re-Evaluation 03/18/15   PT Start Time 1340   PT Stop Time 2951   PT Time Calculation (min) 62 min      Past Medical History  Diagnosis Date  . Ulcerative colitis   . Hearing loss     Right ear w/ hearing aid (new one in 2008)  . OAB (overactive bladder)     or BPH?  Marland Kitchen Borderline hypertension     Past Surgical History  Procedure Laterality Date  . Rhinoplasty  1997  . Right ear surgery for scar tissue removal  1996    There were no vitals filed for this visit.  Visit Diagnosis:  Pain in joint, ankle and foot, left - Plan: PT plan of care cert/re-cert  Abnormality of gait - Plan: PT plan of care cert/re-cert  Ankle weakness - Plan: PT plan of care cert/re-cert  Stiffness of ankle joint, left - Plan: PT plan of care cert/re-cert      Subjective Assessment - 01/21/15 1345    Subjective Pt reports on set of Lt foot /ankle pain about 2 months ago.  He started exercising to lose weight added in plyometrics.  Initially he had intermittent Lt ankle pain  a week ago the pain was worse with heel burning and edema.    Pertinent History placed in a boot for one more week. Swelling has decreased   How long can you stand comfortably? 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 store ambulation only   Diagnostic tests NA   Patient Stated Goals return to exercise and yard work   Currently in Pain? No/denies  intermittent pain             [facility] PT Assessment - 01/21/15 0001    Assessment   Medical Diagnosis Lt  achilles tendonitis   Onset Date/Surgical Date 11/21/14   Next MD Visit 02/15/15   Prior Therapy not for this injury   Precautions   Precautions None   Required Braces or Orthoses --  CAM boot   Balance Screen   Has the patient fallen in the past 6 months No   Has the patient had a decrease in activity level because of a fear of falling?  No   Is the patient reluctant to leave their home because of a fear of falling?  No   Prior Function   Level of Independence Independent   Vocation Full time employment   Vocation Requirements [employer]   Leisure exercise, mowing the lawn, working around the house   Observation/Other Assessments   Focus on Therapeutic Outcomes (FOTO)  51% limited   Posture/Postural Control   Posture/Postural Control Postural limitations   Postural Limitations --  pes planus   ROM / Strength   AROM / PROM / Strength AROM;Strength;PROM   AROM   AROM Assessment Site Ankle   Right/Left Ankle Left   Left Ankle Dorsiflexion -4   Left Ankle Plantar Flexion 40   Left Ankle Inversion 35   Left Ankle Eversion 35  PROM   Overall PROM Comments tight Lt talus with posterior mobs   PROM Assessment Site Ankle   Right/Left Ankle Left   Left Ankle Dorsiflexion 8   Left Ankle Plantar Flexion WNL   Left Ankle Inversion WNL   Left Ankle Eversion WNL   Strength   Overall Strength Comments Lt hip/knee WNL   Strength Assessment Site Ankle   Right/Left Ankle Left   Left Ankle Dorsiflexion 5/5   Left Ankle Plantar Flexion 3+/5   Left Ankle Inversion 5/5   Left Ankle Eversion 4-/5   Palpation   Palpation comment tightness and scar tissue in medial Lt achilles, with tenderness   Ambulation/Gait   Ambulation/Gait --  antalgic due to boot   Balance   Balance Assessed --  SLS Lt 6 sec, Rt WNL                   [facility] Adult PT Treatment/Exercise - 01/21/15 0001    Exercises   Exercises Ankle   Modalities   Modalities Vasopneumatic   Vasopneumatic   Number  Minutes Vasopneumatic  15 minutes   Vasopnuematic Location  Ankle  Lt   Vasopneumatic Pressure High   Vasopneumatic Temperature  3*   Ankle Exercises: Stretches   Soleus Stretch 30 seconds   Gastroc Stretch 30 seconds   Ankle Exercises: Standing   SLS with FWD leans on Lt LE 2x10   Ankle Exercises: Supine   T-Band plantarflexion with red band, focus on eccentric 2x10                PT Education - 01/21/15 1418    Education provided Yes   Education Details HEP   Person(s) Educated Patient   Methods Explanation;Demonstration;Handout   Comprehension Returned demonstration             PT Long Term Goals - 01/21/15 1434    PT LONG TERM GOAL #1   Title I with advanced HEP ( 03/18/15)   Time 8   Period Weeks   Status New   PT LONG TERM GOAL #2   Title report pain decrease in Lt ankle to allow return to plyometric exercise ( 03/18/15)   Time 8   Period Weeks   Status New   PT LONG TERM GOAL #3   Title increase Lt ankle dorsiflexion =/> 15 degrees to assist with gait (03/18/15)   Time 8   Period Weeks   Status New   PT LONG TERM GOAL #4   Title demo Lt ankle strength WNL (03/18/15)   Time 8   Period Weeks   Status New   PT LONG TERM GOAL #5   Title improve FOTO =/< 31& limited ( 03/18/15)   Time 8   Period Weeks   Status New               Plan - 01/21/15 1431    Clinical Impression Statement 49 yo male presents with pain in his Lt achilles along with swelling.  He is wearing a full nitro patch, requested he touch base with MD to clarify the order for this. Presents with weakness, stiffness in Lt ankle.  He will be in a boot for one more week and then out of it. He request everyother week due to finances.    Pt will benefit from skilled therapeutic intervention in order to improve on the following deficits Increased edema;Decreased strength;Pain;Abnormal gait;Decreased range of motion;Difficulty walking   Rehab Potential Excellent   PT Frequency  Biweekly    PT Duration 8 weeks   PT Treatment/Interventions Passive range of motion;Iontophoresis 4mg /ml Dexamethasone;Electrical Stimulation;Patient/family education;Cryotherapy;Neuromuscular re-education;Vasopneumatic Device;Manual techniques;Balance training;Ultrasound;Therapeutic exercise;Moist Heat   PT Next Visit Plan [place], cross friction massage, progress HEP add proprioception   Consulted and Agree with Plan of Care Patient         Problem List Patient Active Problem List   Diagnosis Date Noted  . Left Achilles tendinitis 01/18/2015  . Right lumbar radiculitis 09/30/2014  . Hiatal hernia 09/03/2014  . Elevated blood pressure 08/18/2014  . Hemangioma of liver 08/06/2014  . Pneumonia 08/04/2014  . Exertional chest pain 06/22/2014  . Folliculitis 38/93/7342  . Urinary obstruction 12/08/2013  . Right distal Biceps tendon tear 12/04/2012  . Annual physical exam 03/08/2012  . Bradycardia by electrocardiogram 12/20/2010  . Preventative health care 10/19/2010  . Erectile dysfunction 10/19/2010  . Allergic rhinitis 10/19/2010  . COLITIS, ULCERATIVE NOS 05/27/2007    Jeral Pinch, PT 01/21/2015, 2:39 PM  [facility] [place], [place], [address] Phone: [phone]   Fax:  [phone]

## 2015-01-21 NOTE — Patient Instructions (Signed)
Plantar Flexion: Resisted   [place] [phone]   Anchor behind, tubing around left foot, press down to a count of 2, return to a count of 5-6.  Repeat _10___ times per set. Do __2__ sets per session. Do __1__ sessions per day.  Balance: Unilateral - Forward Lean    Soleus Stretch Stand with right foot back, both knees bent. Keeping heel on floor, turned slightly out, lean into wall until stretch is felt in lower calf. Hold _30-45___ seconds. Repeat _1___ times per set. Do _1___ sets per session. Do __2__ sessions per day.  Gastroc Stretch   Stand with right foot back, leg straight, forward leg bent. Keeping heel on floor, turned slightly out, lean into wall until stretch is felt in calf. Hold __30-45__ seconds. Repeat _1___ times per set. Do __1__ sets per session. Do __2__ sessions per day  Balance: Unilateral - Forward Lean   Stand on left foot, hands on hips. Keeping hips level, bend forward as if to touch forehead to wall. Hold ____ seconds. Relax. Repeat ____ times per set. Do ____ sets per session. Do ____ sessions per day.  http://orth.exer.us/88   Copyright  VHI. All rights reserved.  Balance: Unilateral - Forward Lean   Stand on left foot, hands on hips. Keeping hips level, bend forward as if to touch forehead to wall. Hold __1__ seconds. Relax. Repeat __10__ times per set. Do __1__ sets per session. Do __1__ sessions per day.  http://orth.exer.us/88   Copyright  VHI. All rights reserved.

## 2015-02-01 ENCOUNTER — Ambulatory Visit (INDEPENDENT_AMBULATORY_CARE_PROVIDER_SITE_OTHER): Payer: BLUE CROSS/BLUE SHIELD | Admitting: Physical Therapy

## 2015-02-01 DIAGNOSIS — M25572 Pain in left ankle and joints of left foot: Secondary | ICD-10-CM | POA: Diagnosis not present

## 2015-02-01 DIAGNOSIS — R29898 Other symptoms and signs involving the musculoskeletal system: Secondary | ICD-10-CM

## 2015-02-01 DIAGNOSIS — M259 Joint disorder, unspecified: Secondary | ICD-10-CM

## 2015-02-01 DIAGNOSIS — M25672 Stiffness of left ankle, not elsewhere classified: Secondary | ICD-10-CM | POA: Diagnosis not present

## 2015-02-01 DIAGNOSIS — R269 Unspecified abnormalities of gait and mobility: Secondary | ICD-10-CM | POA: Diagnosis not present

## 2015-02-01 NOTE — Patient Instructions (Signed)
  Inversion: Resisted   Cross legs with right leg underneath, foot in tubing loop. Hold tubing around other foot to resist and turn foot in. Repeat __10__ times per set. Do __2__ sets per session. Do _1___ sessions per day.  Eversion: Resisted   With right foot in tubing loop, hold tubing around other foot to resist and turn foot out. Repeat __10__ times per set. Do __2__ sets per session. Do __1__ sessions per day.  Dorsiflexion: Resisted   Facing anchor, tubing around left foot, pull toward face.  Repeat __10__ times per set. Do __2__ sets per session. Do __1__ sessions per day.  Heel Raise: Bilateral (Standing)   Rise on balls of feet. Repeat _10___ times per set. Do __2-3__ sets per session. Do _1___ sessions every other day.  Weight is even between the big toe and little toe.  Stretch calves afterward.

## 2015-02-01 NOTE — Therapy (Signed)
[place], [place], [address] Phone: [phone]   Fax:  [phone]  Physical Therapy Treatment  Patient Details  Name: Benjamin Leonard MRN: [id number] Date of Birth: 1966-02-26 Referring Provider:  Silverio Decamp,*  Encounter Date: 02/01/2015      PT End of Session - 02/01/15 0849    Visit Number 2   Number of Visits 4   Date for PT Re-Evaluation 03/18/15   PT Start Time 0849   PT Stop Time 0933   PT Time Calculation (min) 44 min      Past Medical History  Diagnosis Date  . Ulcerative colitis   . Hearing loss     Right ear w/ hearing aid (new one in 2008)  . OAB (overactive bladder)     or BPH?  Marland Kitchen Borderline hypertension     Past Surgical History  Procedure Laterality Date  . Rhinoplasty  1997  . Right ear surgery for scar tissue removal  1996    There were no vitals filed for this visit.  Visit Diagnosis:  Ankle weakness  Pain in joint, ankle and foot, left  Abnormality of gait  Stiffness of ankle joint, left      Subjective Assessment - 02/01/15 0851    Subjective Pt reports he weaned off cam boot over wkend. Walked the dog ([address]), felt a twinge in ankle but eased off with rest.  Has been compliant with HEP.    Currently in Pain? No/denies            [facility] PT Assessment - 02/01/15 0001    Assessment   Medical Diagnosis Lt achilles tendonitis   Onset Date/Surgical Date 11/21/14   Next MD Visit 02/15/15   AROM   AROM Assessment Site Ankle   Right/Left Ankle Left   Left Ankle Dorsiflexion 15   Left Ankle Plantar Flexion 48   Strength   Strength Assessment Site Ankle   Right/Left Ankle Left   Left Ankle Dorsiflexion 5/5   Left Ankle Plantar Flexion 3+/5   Left Ankle Inversion 5/5   Left Ankle Eversion 5/5                     [facility] Adult PT Treatment/Exercise - 02/01/15 0001    Manual Therapy   Manual Therapy Other (comment)   Other Manual Therapy Ice massage to Rt achilles tendon x 5 min    Ankle Exercises: Aerobic   Stationary Bike L2 x 4 min.    Ankle Exercises: Stretches   Soleus Stretch 2 reps;30 seconds, 2 sets   Gastroc Stretch 2 reps;30 seconds, 2 sets   Ankle Exercises: Supine   T-Band plantarflexion with green band, focus on eccentric 2x10,  inversion x 10, eversion x 10, DF x 10 (all with green )   Ankle Exercises: Standing   SLS Forward leans with cone pick up x 10 each leg    Heel Raises 10 reps  BLE, toes straight x10- out x 10.                 PT Education - 02/01/15 1305    Education provided Yes   Education Details HEP - added to existing program.  Self-care: instructed pt on ice massage to affected area.    Person(s) Educated Patient   Methods Handout;Demonstration;Explanation   Comprehension Returned demonstration;Verbalized understanding             PT Long Term Goals -  02/01/15 1254    PT LONG TERM GOAL #1   Title I with advanced HEP ( 03/18/15)   Time 8   Period Weeks   Status On-going   PT LONG TERM GOAL #2   Title report pain decrease in Lt ankle to allow return to plyometric exercise ( 03/18/15)   Time 8   Period Weeks   Status On-going   PT LONG TERM GOAL #3   Title increase Lt ankle dorsiflexion =/> 15 degrees to assist with gait (03/18/15)   Time 8   Period Weeks   Status Achieved   PT LONG TERM GOAL #4   Title demo Lt ankle strength WNL (03/18/15)   Time 8   Period Weeks   Status On-going   PT LONG TERM GOAL #5   Title improve FOTO =/< 31% limited ( 03/18/15)   Time 8   Period Weeks   Status On-going               Plan - 02/01/15 1144    Clinical Impression Statement Pt demo improved Lt ankle ROM this visit.  Pt able to tolerate all new exercises with minimal/no increase in pain in Lt achilles. Pt noted he is still point tender to touch, some increase in pain with ice massage before area was numb.  Pt has met LTG #3.    Pt will benefit from  skilled therapeutic intervention in order to improve on the following deficits Increased edema;Decreased strength;Pain;Abnormal gait;Decreased range of motion;Difficulty walking   Rehab Potential Excellent   PT Frequency Biweekly   PT Duration 8 weeks   PT Treatment/Interventions Passive range of motion;Iontophoresis 48m/ml Dexamethasone;Electrical Stimulation;Patient/family education;Cryotherapy;Neuromuscular re-education;Vasopneumatic Device;Manual techniques;Balance training;Ultrasound;Therapeutic exercise;Moist Heat   PT Next Visit Plan [place] cross friction massage, progress HEP add proprioception   Consulted and Agree with Plan of Care Patient        Problem List Patient Active Problem List   Diagnosis Date Noted  . Left Achilles tendinitis 01/18/2015  . Right lumbar radiculitis 09/30/2014  . Hiatal hernia 09/03/2014  . Elevated blood pressure 08/18/2014  . Hemangioma of liver 08/06/2014  . Pneumonia 08/04/2014  . Exertional chest pain 06/22/2014  . Folliculitis 194/49/6759 . Urinary obstruction 12/08/2013  . Right distal Biceps tendon tear 12/04/2012  . Annual physical exam 03/08/2012  . Bradycardia by electrocardiogram 12/20/2010  . Preventative health care 10/19/2010  . Erectile dysfunction 10/19/2010  . Allergic rhinitis 10/19/2010  . COLITIS, ULCERATIVE NOS 05/27/2007   JKerin Perna PTA 02/01/2015 1:06 PM  [place] [address]Phone: [phone]  Fax:  [phone]

## 2015-02-08 ENCOUNTER — Encounter: Payer: BLUE CROSS/BLUE SHIELD | Admitting: Physical Therapy

## 2015-02-15 ENCOUNTER — Ambulatory Visit (INDEPENDENT_AMBULATORY_CARE_PROVIDER_SITE_OTHER): Payer: BLUE CROSS/BLUE SHIELD | Admitting: Physical Therapy

## 2015-02-15 ENCOUNTER — Encounter: Payer: Self-pay | Admitting: Physical Therapy

## 2015-02-15 ENCOUNTER — Encounter: Payer: Self-pay | Admitting: Sports Medicine

## 2015-02-15 ENCOUNTER — Ambulatory Visit (INDEPENDENT_AMBULATORY_CARE_PROVIDER_SITE_OTHER): Payer: BLUE CROSS/BLUE SHIELD | Admitting: Sports Medicine

## 2015-02-15 VITALS — BP 120/77 | HR 68 | Ht 72.0 in | Wt 184.0 lb

## 2015-02-15 DIAGNOSIS — F419 Anxiety disorder, unspecified: Secondary | ICD-10-CM

## 2015-02-15 DIAGNOSIS — R079 Chest pain, unspecified: Secondary | ICD-10-CM

## 2015-02-15 DIAGNOSIS — K449 Diaphragmatic hernia without obstruction or gangrene: Secondary | ICD-10-CM | POA: Diagnosis not present

## 2015-02-15 DIAGNOSIS — M25672 Stiffness of left ankle, not elsewhere classified: Secondary | ICD-10-CM | POA: Diagnosis not present

## 2015-02-15 DIAGNOSIS — M7662 Achilles tendinitis, left leg: Secondary | ICD-10-CM

## 2015-02-15 DIAGNOSIS — M25572 Pain in left ankle and joints of left foot: Secondary | ICD-10-CM | POA: Diagnosis not present

## 2015-02-15 DIAGNOSIS — F418 Other specified anxiety disorders: Secondary | ICD-10-CM

## 2015-02-15 DIAGNOSIS — F329 Major depressive disorder, single episode, unspecified: Secondary | ICD-10-CM | POA: Insufficient documentation

## 2015-02-15 DIAGNOSIS — F32A Depression, unspecified: Secondary | ICD-10-CM | POA: Insufficient documentation

## 2015-02-15 MED ORDER — BUPROPION HCL ER (XL) 150 MG PO TB24: 150.0000 mg | ORAL_TABLET | ORAL | Status: DC

## 2015-02-15 MED ORDER — ESOMEPRAZOLE MAGNESIUM 40 MG PO CPDR: DELAYED_RELEASE_CAPSULE | ORAL | Status: DC

## 2015-02-15 NOTE — Therapy (Signed)
[place], [place], [address] Phone: [phone]   Fax:  [phone]  Physical Therapy Treatment  Patient Details  Name: Benjamin Leonard MRN: [id number] Date of Birth: 1966/04/09 Referring Provider:  Silverio Decamp,*  Encounter Date: 02/15/2015      PT End of Session - 02/15/15 1011    Visit Number 3   Number of Visits 6   Date for PT Re-Evaluation 03/29/15   PT Start Time 0931   PT Stop Time 1015   PT Time Calculation (min) 44 min      Past Medical History  Diagnosis Date  . Ulcerative colitis   . Hearing loss     Right ear w/ hearing aid (new one in 2008)  . OAB (overactive bladder)     or BPH?  Marland Kitchen Borderline hypertension     Past Surgical History  Procedure Laterality Date  . Rhinoplasty  1997  . Right ear surgery for scar tissue removal  1996    There were no vitals filed for this visit.  Visit Diagnosis:  Pain in joint, ankle and foot, left  Stiffness of ankle joint, left      Subjective Assessment - 02/15/15 0936    Subjective Pt reports he is seeing improvement. He will still have occassional pain. Pt sees the MD after todays treatment and will see what he says.    Patient Stated Goals return to exercise and yard work   Currently in Pain? No/denies  will have twinges now and then, reports 30% improvement.             [facility] PT Assessment - 02/15/15 0001    Assessment   Medical Diagnosis Lt achilles tendonitis   Onset Date/Surgical Date 11/21/14   Next MD Visit 02/15/15   Observation/Other Assessments   Focus on Therapeutic Outcomes (FOTO)  34% limited   AROM   AROM Assessment Site Ankle   Right/Left Ankle Left   Left Ankle Dorsiflexion 15   Left Ankle Plantar Flexion 48   Strength   Strength Assessment Site Ankle   Right/Left Ankle Left   Left Ankle Dorsiflexion 5/5   Left Ankle Plantar Flexion 4-/5   Left Ankle Inversion 5/5   Left Ankle Eversion  5/5   Palpation   Palpation comment hypersensitive to touch Lt achilles tendon                     [facility] Adult PT Treatment/Exercise - 02/15/15 0001    Modalities   Modalities Ultrasound   Ultrasound   Ultrasound Location Lt achilles   Ultrasound Parameters 5%. 1.0 w/cm2, 3.3 mhz   Ultrasound Goals Pain  scar tissue   Manual Therapy   Manual Therapy Myofascial release   Myofascial Release cross friction to Lt achilles   Other Manual Therapy Ice massage to Rt achilles tendon x 5 min    Ankle Exercises: Aerobic   Stationary Bike L3x5'   Ankle Exercises: Stretches   Soleus Stretch 2 reps;30 seconds   Gastroc Stretch 2 reps;30 seconds   Other Stretch prostretch   Ankle Exercises: Supine   T-Band --  issued a blue band   Ankle Exercises: Standing   SLS at rebounder on black therapad, FWD and 45 degree angle to each side.    Heel Raises 10 reps  off step, toes in/out/straight  PT Long Term Goals - 02/15/15 0931    PT LONG TERM GOAL #1   Title I with advanced HEP ( 03/29/15)  increased to blue band   Status Achieved   PT LONG TERM GOAL #2   Title report pain decrease in Lt ankle to allow return to plyometric exercise ( 03/29/15)   Status On-going   PT LONG TERM GOAL #3   Title increase Lt ankle dorsiflexion =/> 15 degrees to assist with gait (03/29/15)   Status Achieved   PT LONG TERM GOAL #4   Title demo Lt ankle strength WNL (03/29/15)   Status On-going   PT LONG TERM GOAL #5   Title improve FOTO =/< 31% limited ( 03/29/15)  scored 43% limited   Status On-going               Plan - 02/15/15 1009    Clinical Impression Statement Pt having some improvement in pain, significant improvement in ankle ROM.  He has met some goals and progressing to the others.   Pt will benefit from skilled therapeutic intervention in order to improve on the following deficits Increased edema;Decreased strength;Pain;Abnormal gait;Decreased  range of motion;Difficulty walking   Rehab Potential Excellent   PT Frequency Biweekly   PT Duration 8 weeks   PT Treatment/Interventions Passive range of motion;Iontophoresis 74m/ml Dexamethasone;Electrical Stimulation;Patient/family education;Cryotherapy;Neuromuscular re-education;Vasopneumatic Device;Manual techniques;Balance training;Ultrasound;Therapeutic exercise;Moist Heat   PT Next Visit Plan [place] cross friction massage, progress HEP add proprioception   Consulted and Agree with Plan of Care Patient        Problem List Patient Active Problem List   Diagnosis Date Noted  . Left Achilles tendinitis 01/18/2015  . Right lumbar radiculitis 09/30/2014  . Hiatal hernia 09/03/2014  . Elevated blood pressure 08/18/2014  . Hemangioma of liver 08/06/2014  . Pneumonia 08/04/2014  . Exertional chest pain 06/22/2014  . Folliculitis 155/97/4163 . Urinary obstruction 12/08/2013  . Right distal Biceps tendon tear 12/04/2012  . Annual physical exam 03/08/2012  . Bradycardia by electrocardiogram 12/20/2010  . Preventative health care 10/19/2010  . Erectile dysfunction 10/19/2010  . Allergic rhinitis 10/19/2010  . COLITIS, ULCERATIVE NOS 05/27/2007    SJeral Pinch PT 02/15/2015, 10:14 AM  [facility] [address]Phone: [phone]  Fax:  [phone]

## 2015-02-15 NOTE — Assessment & Plan Note (Signed)
Switch Nexium to twice a day.

## 2015-02-15 NOTE — Addendum Note (Signed)
Addended by: Corleone Biegler E on: 02/15/2015 10:18 AM   Modules accepted: Orders

## 2015-02-15 NOTE — Assessment & Plan Note (Signed)
Overall improving.  Increasing to one quarter nitroglycerin patch and continue physical therapy

## 2015-02-15 NOTE — Progress Notes (Signed)
  Subjective:    CC: Follow-up  HPI: Left Achilles tendinosis: Improving significantly with physical therapy, heel lifts, and topical nitroglycerin.  Substernal chest pain: Has had a negative stress echo, and in negative EGD with the exception of a hiatal hernia, he uses Nexium in the morning, usually gets sensations of fullness and tightness at night when laying flat. He is a power lifter, and has no chest pain during exertion. No sour brash, no nausea or vomiting. No throat clearing through the day.  Depression: With anxiety, going through some financial troubles with his wife, she had recommended that he try some Wellbutrin, he would like to try this for now.  Past medical history, Surgical history, Family history not pertinant except as noted below, Social history, Allergies, and medications have been entered into the medical record, reviewed, and no changes needed.   Review of Systems: No fevers, chills, night sweats, weight loss, chest pain, or shortness of breath.   Objective:    General: Well Developed, well nourished, and in no acute distress.  Neuro: Alert and oriented x3, extra-ocular muscles intact, sensation grossly intact.  HEENT: Normocephalic, atraumatic, pupils equal round reactive to light, neck supple, no masses, no lymphadenopathy, thyroid nonpalpable.  Skin: Warm and dry, no rashes. Cardiac: Regular rate and rhythm, no murmurs rubs or gallops, no lower extremity edema.  Respiratory: Clear to auscultation bilaterally. Not using accessory muscles, speaking in full sentences.  Impression and Recommendations:    I spent 40 minutes with this patient, greater than 50% was face-to-face time counseling regarding the above diagnoses

## 2015-02-15 NOTE — Assessment & Plan Note (Signed)
Adding bupropion per patient request

## 2015-02-22 ENCOUNTER — Encounter: Payer: Self-pay | Admitting: Rehabilitative and Restorative Service Providers"

## 2015-02-22 ENCOUNTER — Ambulatory Visit (INDEPENDENT_AMBULATORY_CARE_PROVIDER_SITE_OTHER): Payer: BLUE CROSS/BLUE SHIELD | Admitting: Rehabilitative and Restorative Service Providers"

## 2015-02-22 DIAGNOSIS — M25572 Pain in left ankle and joints of left foot: Secondary | ICD-10-CM | POA: Diagnosis not present

## 2015-02-22 DIAGNOSIS — M25672 Stiffness of left ankle, not elsewhere classified: Secondary | ICD-10-CM | POA: Diagnosis not present

## 2015-02-22 DIAGNOSIS — R269 Unspecified abnormalities of gait and mobility: Secondary | ICD-10-CM | POA: Diagnosis not present

## 2015-02-22 DIAGNOSIS — M259 Joint disorder, unspecified: Secondary | ICD-10-CM

## 2015-02-22 DIAGNOSIS — R29898 Other symptoms and signs involving the musculoskeletal system: Secondary | ICD-10-CM

## 2015-02-22 NOTE — Therapy (Signed)
[place], [place], [address] Phone: [phone]   Fax:  [phone]  Physical Therapy Treatment  Patient Details  Name: Benjamin Leonard MRN: [id number] Date of Birth: 1966-07-30 Referring Provider:  Silverio Decamp,*  Encounter Date: 02/22/2015      PT End of Session - 02/22/15 1256    Visit Number 4   Number of Visits 6   Date for PT Re-Evaluation 03/29/15   PT Start Time 0933   PT Stop Time 1022   PT Time Calculation (min) 49 min   Activity Tolerance Patient tolerated treatment well      Past Medical History  Diagnosis Date  . Ulcerative colitis   . Hearing loss     Right ear w/ hearing aid (new one in 2008)  . OAB (overactive bladder)     or BPH?  Marland Kitchen Borderline hypertension     Past Surgical History  Procedure Laterality Date  . Rhinoplasty  1997  . Right ear surgery for scar tissue removal  1996    There were no vitals filed for this visit.  Visit Diagnosis:  Pain in joint, ankle and foot, left  Stiffness of ankle joint, left  Ankle weakness  Abnormality of gait      Subjective Assessment - 02/22/15 0941    Subjective Significant flare up of pain following last treatment. Has hurt since.    Currently in Pain? Yes   Pain Score 6    Pain Location Ankle   Pain Orientation Left   Pain Descriptors / Indicators Sore;Aching   Pain Type Acute pain   Pain Onset More than a month ago   Pain Frequency Intermittent  more frequent in the past week.    Aggravating Factors  sitting for any length of time and returning to stand/walk   Pain Relieving Factors siting            [facility] Adult PT Treatment/Exercise - 02/22/15 0001    Modalities   Modalities Ultrasound   Cryotherapy   Number Minutes Cryotherapy 15 Minutes   Cryotherapy Location Ankle  calf   Type of Cryotherapy Ice pack  ice massage to achilles 5 min before cold pack   Ultrasound   Ultrasound Location  Lt achilles into calf   Ultrasound Parameters 20% 1.0 w/cm2 8 min   Ultrasound Goals Pain   Manual Therapy   Manual Therapy Myofascial release   Myofascial Release soft tissue mobilization for gastroc/soleus with deep pressure to areas of tightness   Other Manual Therapy Ice massage to Rt achilles tendon x 5 min    Ankle Exercises: Aerobic   Stationary Bike L3x5'   Ankle Exercises: Stretches   Soleus Stretch 30 seconds   Gastroc Stretch 3 reps;30 seconds                PT Education - 02/22/15 1255    Education provided Yes   Education Details advised patient to decrease exercise focusing on the stretching and walking allowing tissues to calm down - to continue with ice    Person(s) Educated Patient   Methods Explanation   Comprehension Verbalized understanding             PT Long Term Goals - 02/15/15 0931    PT LONG TERM GOAL #1   Title I with advanced HEP ( 03/29/15)  increased to blue band   Status Achieved   PT LONG TERM GOAL #2   Title report pain  decrease in Lt ankle to allow return to plyometric exercise ( 03/29/15)   Status On-going   PT LONG TERM GOAL #3   Title increase Lt ankle dorsiflexion =/> 15 degrees to assist with gait (03/29/15)   Status Achieved   PT LONG TERM GOAL #4   Title demo Lt ankle strength WNL (03/29/15)   Status On-going   PT LONG TERM GOAL #5   Title improve FOTO =/< 31% limited ( 03/29/15)  scored 43% limited   Status On-going               Plan - 02/22/15 1257    Clinical Impression Statement patient presents with flare up of pain in Lt achilles tendon area   Pt will benefit from skilled therapeutic intervention in order to improve on the following deficits Increased edema;Decreased strength;Pain;Abnormal gait;Decreased range of motion;Difficulty walking   Rehab Potential Excellent   PT Frequency Biweekly   PT Duration 8 weeks   PT Treatment/Interventions Passive range of motion;Iontophoresis 4mg /ml  Dexamethasone;Electrical Stimulation;Patient/family education;Cryotherapy;Neuromuscular re-education;Vasopneumatic Device;Manual techniques;Balance training;Ultrasound;Therapeutic exercise;Moist Heat   PT Next Visit Plan [place], cross friction massage, progress HEP add proprioception - progressing as tolerated   Consulted and Agree with Plan of Care Patient        Problem List Patient Active Problem List   Diagnosis Date Noted  . Anxiety and depression 02/15/2015  . Left Achilles tendinitis 01/18/2015  . Right lumbar radiculitis 09/30/2014  . Hiatal hernia 09/03/2014  . Elevated blood pressure 08/18/2014  . Hemangioma of liver 08/06/2014  . Exertional chest pain 06/22/2014  . Urinary obstruction 12/08/2013  . Right distal Biceps tendon tear 12/04/2012  . Annual physical exam 03/08/2012  . Bradycardia by electrocardiogram 12/20/2010  . Preventative health care 10/19/2010  . Erectile dysfunction 10/19/2010  . Allergic rhinitis 10/19/2010  . COLITIS, ULCERATIVE NOS 05/27/2007    Dondrea Clendenin Nilda Simmer, PT, MPH 02/22/2015, 1:01 PM  [facility] [place], [place], [address] Phone: [phone]   Fax:  [phone]

## 2015-03-01 ENCOUNTER — Ambulatory Visit (INDEPENDENT_AMBULATORY_CARE_PROVIDER_SITE_OTHER): Payer: BLUE CROSS/BLUE SHIELD | Admitting: Physical Therapy

## 2015-03-01 DIAGNOSIS — M25572 Pain in left ankle and joints of left foot: Secondary | ICD-10-CM

## 2015-03-01 DIAGNOSIS — R29898 Other symptoms and signs involving the musculoskeletal system: Secondary | ICD-10-CM

## 2015-03-01 DIAGNOSIS — M259 Joint disorder, unspecified: Secondary | ICD-10-CM

## 2015-03-01 DIAGNOSIS — M25672 Stiffness of left ankle, not elsewhere classified: Secondary | ICD-10-CM | POA: Diagnosis not present

## 2015-03-01 NOTE — Therapy (Addendum)
[place], [place], [address] Phone: [phone]   Fax:  [phone]  Physical Therapy Treatment  Patient Details  Name: Benjamin Leonard MRN: [id number] Date of Birth: May 15, 1966 Referring Provider:  Silverio Decamp,*  Encounter Date: 03/01/2015      PT End of Session - 03/01/15 1008    Visit Number 5   Number of Visits 9   Date for PT Re-Evaluation 03/29/15   PT Start Time 0934   PT Stop Time 1015   PT Time Calculation (min) 41 min      Past Medical History  Diagnosis Date  . Ulcerative colitis   . Hearing loss     Right ear w/ hearing aid (new one in 2008)  . OAB (overactive bladder)     or BPH?  Marland Kitchen Borderline hypertension     Past Surgical History  Procedure Laterality Date  . Rhinoplasty  1997  . Right ear surgery for scar tissue removal  1996    There were no vitals filed for this visit.  Visit Diagnosis:  Pain in joint, ankle and foot, left  Stiffness of ankle joint, left  Ankle weakness      Subjective Assessment - 03/01/15 0939    Subjective It feels better than last week however it is still very sore especially to touch.    Patient Stated Goals return to exercise and yard work   Currently in Pain? Yes   Pain Score 3   up to 8/10 with touch   Pain Location Ankle   Pain Orientation Left   Pain Descriptors / Indicators --  stiffness   Pain Type Acute pain   Pain Onset More than a month ago   Pain Frequency Intermittent   Aggravating Factors  touching, stiffness   Pain Relieving Factors sitting                         [facility] Adult PT Treatment/Exercise - 03/01/15 0001    Modalities   Modalities Ultrasound   Ultrasound   Ultrasound Location Lt achilles   Ultrasound Parameters 50%, 3.89mz, 1.5 w/cm2   Ultrasound Goals Pain   Manual Therapy   Manual Therapy Soft tissue mobilization   Soft tissue mobilization cross friction Lt achilles,  focus medially and post to medial malleoili   Other Manual Therapy Ice massage to Rt achilles tendon x 5 min    Ankle Exercises: Aerobic   Stationary Bike Nustep Nustep L5x5'   Ankle Exercises: Stretches   Plantar Fascia Stretch 2 reps;30 seconds  each side   Other Stretch prostretch   Ankle Exercises: Standing   SLS 5x30 sec on blue thera disc   Heel Raises 10 reps  off step, focus on eccentric      Iontophoresis patch placed on patients Lt achilles distal medial with 1.0 cc dexamethasone, 6 hr patch was utilized and patient was issued the following education sheet.   IONTOPHORESIS PATIENT PRECAUTIONS & CONTRAINDICATIONS:  . Redness under one or both electrodes can occur.  This characterized by a uniform redness that usually disappears within 12 hours of treatment. . Small pinhead size blisters may result in response to the drug.  Contact your physician if the problem persists more than 24 hours. . On rare occasions, iontophoresis therapy can result in temporary skin reactions such as rash, inflammation, irritation or burns.  The skin reactions may be the result of individual sensitivity to the ionic  solution used, the condition of the skin at the start of treatment, reaction to the materials in the electrodes, allergies or sensitivity to dexamethasone, or a poor connection between the patch and your skin.  Discontinue using iontophoresis if you have any of these reactions and report to your therapist. . Remove the Patch or electrodes if you have any undue sensation of pain or burning during the treatment and report discomfort to your therapist. . Tell your Therapist if you have had known adverse reactions to the application of electrical current. . If using the Patch, the LED light will turn off when treatment is complete and the patch can be removed.  Approximate treatment time is 1-3 hours.  Remove the patch when light goes off or after 6 hours. . The Patch can be worn during normal  activity, however excessive motion where the electrodes have been placed can cause poor contact between the skin and the electrode or uneven electrical current resulting in greater risk of skin irritation. Marland Kitchen Keep out of the reach of children.   . DO NOT use if you have a cardiac pacemaker or any other electrically sensitive implanted device. . DO NOT use if you have a known sensitivity to dexamethasone. . DO NOT use during Magnetic Resonance Imaging (MRI). . DO NOT use over broken or compromised skin (e.g. sunburn, cuts, or acne) due to the increased risk of skin reaction. . DO NOT SHAVE over the area to be treated:  To establish good contact between the Patch and the skin, excessive hair may be clipped. . DO NOT place the Patch or electrodes on or over your eyes, directly over your heart, or brain. DO NOT reuse the Patch or electrodes as this may cause burns to occur.               PT Long Term Goals - 03/01/15 0950    PT LONG TERM GOAL #1   Title I with advanced HEP ( 03/29/15)   Status Achieved   PT LONG TERM GOAL #2   Title report pain decrease in Lt ankle to allow return to plyometric exercise ( 03/29/15)   Status On-going   PT LONG TERM GOAL #3   Title increase Lt ankle dorsiflexion =/> 15 degrees to assist with gait (03/29/15)   Status Achieved   PT LONG TERM GOAL #4   Title demo Lt ankle strength WNL (03/29/15)   Status On-going   PT LONG TERM GOAL #5   Title improve FOTO =/< 31% limited ( 03/29/15)   Status On-going               Plan - 03/01/15 1006    Clinical Impression Statement Pt improved from last week. No new goals met.  will initiate iontophoresis today with dexamethasone. Pain is biggest limiting factor   Pt will benefit from skilled therapeutic intervention in order to improve on the following deficits Increased edema;Decreased strength;Pain;Abnormal gait;Decreased range of motion;Difficulty walking   Rehab Potential Good   PT Frequency 1x / week    PT Duration 8 weeks   PT Treatment/Interventions Passive range of motion;Iontophoresis 51m/ml Dexamethasone;Electrical Stimulation;Patient/family education;Cryotherapy;Neuromuscular re-education;Vasopneumatic Device;Manual techniques;Balance training;Ultrasound;Therapeutic exercise;Moist Heat   PT Next Visit Plan cont ionto   Consulted and Agree with Plan of Care Patient        Problem List Patient Active Problem List   Diagnosis Date Noted  . Anxiety and depression 02/15/2015  . Left Achilles tendinitis 01/18/2015  . Right lumbar radiculitis 09/30/2014  .  Hiatal hernia 09/03/2014  . Elevated blood pressure 08/18/2014  . Hemangioma of liver 08/06/2014  . Exertional chest pain 06/22/2014  . Urinary obstruction 12/08/2013  . Right distal Biceps tendon tear 12/04/2012  . Annual physical exam 03/08/2012  . Bradycardia by electrocardiogram 12/20/2010  . Preventative health care 10/19/2010  . Erectile dysfunction 10/19/2010  . Allergic rhinitis 10/19/2010  . COLITIS, ULCERATIVE NOS 05/27/2007    Jeral Pinch PT 03/01/2015, 10:08 AM  [facility] [place], [place], [address] Phone: [phone]   Fax:  [phone]

## 2015-03-08 ENCOUNTER — Ambulatory Visit (INDEPENDENT_AMBULATORY_CARE_PROVIDER_SITE_OTHER): Payer: BLUE CROSS/BLUE SHIELD | Admitting: Physical Therapy

## 2015-03-08 DIAGNOSIS — M259 Joint disorder, unspecified: Secondary | ICD-10-CM | POA: Diagnosis not present

## 2015-03-08 DIAGNOSIS — M25672 Stiffness of left ankle, not elsewhere classified: Secondary | ICD-10-CM | POA: Diagnosis not present

## 2015-03-08 DIAGNOSIS — R29898 Other symptoms and signs involving the musculoskeletal system: Secondary | ICD-10-CM

## 2015-03-08 DIAGNOSIS — M25572 Pain in left ankle and joints of left foot: Secondary | ICD-10-CM | POA: Diagnosis not present

## 2015-03-08 NOTE — Therapy (Signed)
[place], [place], [address] Phone: [phone]   Fax:  [phone]  Physical Therapy Treatment  Patient Details  Name: Benjamin Leonard MRN: [id number] Date of Birth: 02-01-1966 Referring Provider:  Silverio Decamp,*  Encounter Date: 03/08/2015      PT End of Session - 03/08/15 0940    Visit Number 6   Number of Visits 9   Date for PT Re-Evaluation 03/29/15   PT Start Time 0935   PT Stop Time 1025   PT Time Calculation (min) 50 min      Past Medical History  Diagnosis Date  . Ulcerative colitis   . Hearing loss     Right ear w/ hearing aid (new one in 2008)  . OAB (overactive bladder)     or BPH?  Marland Kitchen Borderline hypertension     Past Surgical History  Procedure Laterality Date  . Rhinoplasty  1997  . Right ear surgery for scar tissue removal  1996    There were no vitals filed for this visit.  Visit Diagnosis:  Pain in joint, ankle and foot, left  Stiffness of ankle joint, left  Ankle weakness      Subjective Assessment - 03/08/15 0940    Subjective Pt voices frustration, "I'm not seeing a lot of change".  Achilles still tender to touch at times. Chief complaint is constant stiffness and intermittent soreness. Pain has decreased now that he has backed off some exercises.    Currently in Pain? No/denies   Pain Score --  up to 4/10 pain when touching area.    Pain Location Ankle   Pain Descriptors / Indicators --  stiffness             [facility] PT Assessment - 03/08/15 0001    Assessment   Medical Diagnosis Lt achilles tendonitis   Onset Date/Surgical Date 11/21/14   Next MD Visit 03/15/15          [facility] Adult PT Treatment/Exercise - 03/08/15 0001    Exercises   Exercises Ankle   Modalities   Modalities Iontophoresis;Ultrasound   Ultrasound   Ultrasound Location Lt achilles tendon/ medial ankle    Ultrasound Parameters 100%, 3.3 mHz, 1.1 w/cm2   Ultrasound  Goals Pain   Iontophoresis   Type of Iontophoresis Dexamethasone   Location Lt medial mid-achilles tendon   Dose 1.3 cc   Time 12 hour patch    Manual Therapy   Manual Therapy Soft tissue mobilization;Myofascial release   Soft tissue mobilization cross friction Lt achilles, focus medially and post to medial malleoili, cross fiber to post tib/FDL    Myofascial Release to Lt plantar fascia, Lt gastroc/soleus    Ankle Exercises: Aerobic   Stationary Bike Nustep Nustep L5x5'   Ankle Exercises: Stretches   Soleus Stretch 30 seconds;4 reps  each leg    Gastroc Stretch 30 seconds;4 reps  each leg    Ankle Exercises: Standing   SLS On blue pad;  30 sec x 4; with horizontal head turns/ vertical head turns.       Heel Raises 10 reps  off step, focus on eccentric. 5 reps LLE x 2     Heel Walk (Round Trip) 6 ft with UE support    Toe Walk (Round Trip) 6 ft with UE support   challenging    Side Shuffle (Round Trip) 5 ft x 6 trials. Pt reported pain in Lt medial ankle with Rt direction shuffle.  PT Education - 03/08/15 1043    Education provided Yes   Education Details self care: advised pt to try self massage to bottom of Lt foot and medial ankle, to tolerance. Also encouraged pt to try alternating heat/ cold applications.    Person(s) Educated Patient   Methods Explanation   Comprehension Verbalized understanding;Returned demonstration             PT Long Term Goals - 03/01/15 0950    PT LONG TERM GOAL #1   Title I with advanced HEP ( 03/29/15)   Status Achieved   PT LONG TERM GOAL #2   Title report pain decrease in Lt ankle to allow return to plyometric exercise ( 03/29/15)   Status On-going   PT LONG TERM GOAL #3   Title increase Lt ankle dorsiflexion =/> 15 degrees to assist with gait (03/29/15)   Status Achieved   PT LONG TERM GOAL #4   Title demo Lt ankle strength WNL (03/29/15)   Status On-going   PT LONG TERM GOAL #5   Title improve FOTO =/< 31%  limited ( 03/29/15)   Status On-going               Plan - 03/08/15 1036    Clinical Impression Statement Pt tolerated 1st ionto treatment without any adverse reactions.  Pt point tender along tendons and insertions of Lt flexor digitorum longus and tibialis posterior, as well as medial portion of mid achilles tendon during manual therapy.  Some difficulty tolerating eccentric heel raises; kept reps limited this session. No goals met yet this date.    Pt will benefit from skilled therapeutic intervention in order to improve on the following deficits Increased edema;Decreased strength;Pain;Abnormal gait;Decreased range of motion;Difficulty walking   Rehab Potential Good   PT Frequency 1x / week   PT Duration 8 weeks   PT Treatment/Interventions Passive range of motion;Iontophoresis 50m/ml Dexamethasone;Electrical Stimulation;Patient/family education;Cryotherapy;Neuromuscular re-education;Vasopneumatic Device;Manual techniques;Balance training;Ultrasound;Therapeutic exercise;Moist Heat   PT Next Visit Plan Continue ionto, manual, and progressive strengthening to Lt ankle/lower leg.  Write MD note, pt to see MD right after next tx.    Consulted and Agree with Plan of Care Patient        Problem List Patient Active Problem List   Diagnosis Date Noted  . Anxiety and depression 02/15/2015  . Left Achilles tendinitis 01/18/2015  . Right lumbar radiculitis 09/30/2014  . Hiatal hernia 09/03/2014  . Elevated blood pressure 08/18/2014  . Hemangioma of liver 08/06/2014  . Exertional chest pain 06/22/2014  . Urinary obstruction 12/08/2013  . Right distal Biceps tendon tear 12/04/2012  . Annual physical exam 03/08/2012  . Bradycardia by electrocardiogram 12/20/2010  . Preventative health care 10/19/2010  . Erectile dysfunction 10/19/2010  . Allergic rhinitis 10/19/2010  . COLITIS, ULCERATIVE NOS 05/27/2007    CShelbie Hutching8/03/2015, 10:52 AM  [facility] [address]Phone: [phone]  Fax:  [phone]

## 2015-03-15 ENCOUNTER — Ambulatory Visit (INDEPENDENT_AMBULATORY_CARE_PROVIDER_SITE_OTHER): Payer: BLUE CROSS/BLUE SHIELD | Admitting: Physical Therapy

## 2015-03-15 ENCOUNTER — Ambulatory Visit (INDEPENDENT_AMBULATORY_CARE_PROVIDER_SITE_OTHER): Payer: BLUE CROSS/BLUE SHIELD | Admitting: Sports Medicine

## 2015-03-15 ENCOUNTER — Encounter: Payer: Self-pay | Admitting: Physical Therapy

## 2015-03-15 ENCOUNTER — Encounter: Payer: Self-pay | Admitting: Sports Medicine

## 2015-03-15 VITALS — BP 121/73 | HR 98 | Ht 72.0 in | Wt 184.0 lb

## 2015-03-15 DIAGNOSIS — M259 Joint disorder, unspecified: Secondary | ICD-10-CM

## 2015-03-15 DIAGNOSIS — M25672 Stiffness of left ankle, not elsewhere classified: Secondary | ICD-10-CM

## 2015-03-15 DIAGNOSIS — M25572 Pain in left ankle and joints of left foot: Secondary | ICD-10-CM

## 2015-03-15 DIAGNOSIS — M7662 Achilles tendinitis, left leg: Secondary | ICD-10-CM

## 2015-03-15 DIAGNOSIS — R269 Unspecified abnormalities of gait and mobility: Secondary | ICD-10-CM | POA: Diagnosis not present

## 2015-03-15 DIAGNOSIS — F418 Other specified anxiety disorders: Secondary | ICD-10-CM | POA: Diagnosis not present

## 2015-03-15 DIAGNOSIS — F419 Anxiety disorder, unspecified: Secondary | ICD-10-CM

## 2015-03-15 DIAGNOSIS — K449 Diaphragmatic hernia without obstruction or gangrene: Secondary | ICD-10-CM

## 2015-03-15 DIAGNOSIS — R29898 Other symptoms and signs involving the musculoskeletal system: Secondary | ICD-10-CM

## 2015-03-15 DIAGNOSIS — F32A Depression, unspecified: Secondary | ICD-10-CM

## 2015-03-15 DIAGNOSIS — F329 Major depressive disorder, single episode, unspecified: Secondary | ICD-10-CM

## 2015-03-15 MED ORDER — VILAZODONE HCL 10 & 20 MG PO KIT: 1.0000 | PACK | Freq: Every day | ORAL | Status: DC

## 2015-03-15 NOTE — Progress Notes (Signed)
  Subjective:    CC: follow-up  HPI: Left Achilles tendinitis: Improved slightly but unfortunately has been persistent with a nodule despite physical therapy, 2 week course of immobilization, heel lift, and topical nitroglycerin.  Anxiety and depression: We used Wellbutrin at the last visit because this was the patient's request as a first-line agent. He had no response to his mood, and developed some severe constipation and is amenable to try something else that we want.  Hiatal hernia: Symptoms all resolved now with Nexium 40.  Past medical history, Surgical history, Family history not pertinant except as noted below, Social history, Allergies, and medications have been entered into the medical record, reviewed, and no changes needed.   Review of Systems: No fevers, chills, night sweats, weight loss, chest pain, or shortness of breath.   Objective:    General: Well Developed, well nourished, and in no acute distress.  Neuro: Alert and oriented x3, extra-ocular muscles intact, sensation grossly intact.  HEENT: Normocephalic, atraumatic, pupils equal round reactive to light, neck supple, no masses, no lymphadenopathy, thyroid nonpalpable.  Skin: Warm and dry, no rashes. Cardiac: Regular rate and rhythm, no murmurs rubs or gallops, no lower extremity edema.  Respiratory: Clear to auscultation bilaterally. Not using accessory muscles, speaking in full sentences.  Impression and Recommendations:    I spent 25 minutes with this patient, greater than 50% was face-to-face time counseling regarding the above diagnoses

## 2015-03-15 NOTE — Assessment & Plan Note (Signed)
Symptoms completely resolved with Nexium 40 mg

## 2015-03-15 NOTE — Assessment & Plan Note (Signed)
Persistent, despite one half patch nitroglycerin, physical therapy, he has now developed some tibialis posterior tendinitis. At this point we are to proceed with 4 weeks of immobilization in a boot. I would also like an MRI to confirm the diagnosis. If still has pain afterwards we can consider PRP injection.

## 2015-03-15 NOTE — Therapy (Addendum)
[facility] [place] [address] [place], [place], [address] Phone: [phone]   Fax:  [phone]  Physical Therapy Treatment  Patient Details  Name: Benjamin Leonard MRN: [id number] Date of Birth: 06-Sep-1965 Referring Provider:  Silverio Decamp,*  Encounter Date: 03/15/2015      PT End of Session - 03/15/15 0936    Visit Number 7   Number of Visits 9   Date for PT Re-Evaluation 03/29/15   PT Start Time 0933   PT Stop Time 1013   PT Time Calculation (min) 40 min   Activity Tolerance Patient tolerated treatment well      Past Medical History  Diagnosis Date  . Ulcerative colitis   . Hearing loss     Right ear w/ hearing aid (new one in 2008)  . OAB (overactive bladder)     or BPH?  Marland Kitchen Borderline hypertension     Past Surgical History  Procedure Laterality Date  . Rhinoplasty  1997  . Right ear surgery for scar tissue removal  1996    There were no vitals filed for this visit.  Visit Diagnosis:  Pain in joint, ankle and foot, left  Stiffness of ankle joint, left  Ankle weakness  Abnormality of gait      Subjective Assessment - 03/15/15 0934    Subjective Pt still frustrated with not feeling like he is having improvement.  Sees MD this afternoon   Patient Stated Goals return to exercise and yard work   Currently in Pain? Yes   Pain Score 4    Pain Location Ankle   Pain Orientation Left;Medial   Pain Descriptors / Indicators Aching   Pain Type Acute pain   Aggravating Factors  prolonged walking and standing   Pain Relieving Factors resting            [facility] PT Assessment - 03/15/15 0001    Assessment   Medical Diagnosis Lt achilles tendonitis   Onset Date/Surgical Date 11/21/14   Next MD Visit 03/15/15   Observation/Other Assessments   Focus on Therapeutic Outcomes (FOTO)  42% limited   AROM   AROM Assessment Site Ankle   Right/Left Ankle Left   Left Ankle Dorsiflexion 16   Left Ankle  Plantar Flexion 48   Left Ankle Inversion 26   Left Ankle Eversion 16   Strength   Strength Assessment Site Ankle   Right/Left Ankle Left   Left Ankle Dorsiflexion 5/5   Left Ankle Plantar Flexion 4/5   Left Ankle Inversion 5/5  with medial ankle pain   Left Ankle Eversion 5/5  medial and lateral ankle pain       Pt returned to clinic following his MD appointment to have ionto patch placed.  This was placed to Lt achilles and slightly medial with 1.3cc dexamethasone.   MD has patient back in the boot for one month.              [facility] Adult PT Treatment/Exercise - 03/15/15 0001    Modalities   Modalities Ultrasound   Ultrasound   Ultrasound Location lt achilles and medial malleoilli   Ultrasound Parameters 100%, 3.3 mhx, 1.5 w/cm2   Ultrasound Goals Pain   Iontophoresis   Type of Iontophoresis Dexamethasone   Location Lt medial mid-achilles tendon   Dose 1.3 cc   Time 12 hour patch    Manual Therapy   Manual Therapy Soft tissue mobilization;Myofascial release   Soft tissue mobilization cross friction Lt achilles,  focus medially and post to medial malleoili, cross fiber to post tib/FDL    Other Manual Therapy multiple TP present   Ankle Exercises: Aerobic   Stationary Bike Nustep L6 x5'   Ankle Exercises: Plyometrics   Bilateral Jumping --  on rebounder with in pain limitations   Ankle Exercises: Standing   Other Standing Ankle Exercises 2x10 heel taps Rt off 8" step keeping Lt foot flat   Ankle Exercises: Seated   Other Seated Ankle Exercises 3x10 red theraband inversion and eversion                     PT Long Term Goals - 03/15/15 0947    PT LONG TERM GOAL #1   Title I with advanced HEP ( 03/29/15)   Status Achieved   PT LONG TERM GOAL #2   Title report pain decrease in Lt ankle to allow return to plyometric exercise ( 03/29/15)   Status On-going   PT LONG TERM GOAL #3   Title increase Lt ankle dorsiflexion =/> 15 degrees to assist with gait  (03/29/15)   Status Achieved   PT LONG TERM GOAL #4   Title demo Lt ankle strength WNL (03/29/15)   PT LONG TERM GOAL #5   Title improve FOTO =/< 31% limited ( 03/29/15)   Status On-going  currently 42% limited               Plan - 03/15/15 0959    Clinical Impression Statement Pt has had 2 ionto patches, today will be the third. His pain has moved more into themedial ankle, post tib/flexor digitoium longus. multiple trigger points present. (+) edema continues distal medial Lt achilles   Pt will benefit from skilled therapeutic intervention in order to improve on the following deficits Increased edema;Decreased strength;Pain;Abnormal gait;Decreased range of motion;Difficulty walking   PT Frequency 2x / week   PT Duration 8 weeks   PT Treatment/Interventions Passive range of motion;Iontophoresis 4mg /ml Dexamethasone;Electrical Stimulation;Patient/family education;Cryotherapy;Neuromuscular re-education;Vasopneumatic Device;Manual techniques;Balance training;Ultrasound;Therapeutic exercise;Moist Heat   PT Next Visit Plan Continue ionto, manual, and progressive strengthening to Lt ankle/lower leg. ionto   Consulted and Agree with Plan of Care Patient        Problem List Patient Active Problem List   Diagnosis Date Noted  . Anxiety and depression 02/15/2015  . Left Achilles tendinitis 01/18/2015  . Right lumbar radiculitis 09/30/2014  . Hiatal hernia 09/03/2014  . Elevated blood pressure 08/18/2014  . Hemangioma of liver 08/06/2014  . Exertional chest pain 06/22/2014  . Urinary obstruction 12/08/2013  . Right distal Biceps tendon tear 12/04/2012  . Annual physical exam 03/08/2012  . Bradycardia by electrocardiogram 12/20/2010  . Preventative health care 10/19/2010  . Erectile dysfunction 10/19/2010  . Allergic rhinitis 10/19/2010  . COLITIS, ULCERATIVE NOS 05/27/2007    Jeral Pinch PT 03/15/2015, 12:43 PM  [facility] [place], [place], [address] Phone: [phone]   Fax:  [phone]  Jeral Pinch, PT 03/15/2015 12:45 PM

## 2015-03-15 NOTE — Assessment & Plan Note (Signed)
We used Wellbutrin per patient request, he did have intolerable constipation, we are not going to switch to [employer] given.

## 2015-03-22 ENCOUNTER — Other Ambulatory Visit: Payer: Self-pay | Admitting: Gastroenterology

## 2015-03-22 ENCOUNTER — Ambulatory Visit (INDEPENDENT_AMBULATORY_CARE_PROVIDER_SITE_OTHER): Payer: BLUE CROSS/BLUE SHIELD

## 2015-03-22 DIAGNOSIS — K219 Gastro-esophageal reflux disease without esophagitis: Secondary | ICD-10-CM | POA: Diagnosis not present

## 2015-03-22 DIAGNOSIS — K5909 Other constipation: Secondary | ICD-10-CM

## 2015-03-22 DIAGNOSIS — R12 Heartburn: Secondary | ICD-10-CM

## 2015-03-23 ENCOUNTER — Telehealth: Payer: Self-pay

## 2015-03-23 ENCOUNTER — Encounter: Payer: Self-pay | Admitting: Physical Therapy

## 2015-03-23 ENCOUNTER — Ambulatory Visit (INDEPENDENT_AMBULATORY_CARE_PROVIDER_SITE_OTHER): Payer: BLUE CROSS/BLUE SHIELD | Admitting: Physical Therapy

## 2015-03-23 DIAGNOSIS — M259 Joint disorder, unspecified: Secondary | ICD-10-CM

## 2015-03-23 DIAGNOSIS — M25672 Stiffness of left ankle, not elsewhere classified: Secondary | ICD-10-CM | POA: Diagnosis not present

## 2015-03-23 DIAGNOSIS — M25572 Pain in left ankle and joints of left foot: Secondary | ICD-10-CM | POA: Diagnosis not present

## 2015-03-23 DIAGNOSIS — R269 Unspecified abnormalities of gait and mobility: Secondary | ICD-10-CM

## 2015-03-23 DIAGNOSIS — R29898 Other symptoms and signs involving the musculoskeletal system: Secondary | ICD-10-CM

## 2015-03-23 NOTE — Therapy (Signed)
[place], [place], [address] Phone: [phone]   Fax:  [phone]  Physical Therapy Treatment  Patient Details  Name: Benjamin Leonard MRN: [id number] Date of Birth: 01/08/1966 Referring Provider:  Silverio Decamp,*  Encounter Date: 03/23/2015      PT End of Session - 03/23/15 1015    Visit Number 8   Number of Visits 9   Date for PT Re-Evaluation 03/29/15   PT Start Time 0939   PT Stop Time 1015   PT Time Calculation (min) 36 min      Past Medical History  Diagnosis Date  . Ulcerative colitis   . Hearing loss     Right ear w/ hearing aid (new one in 2008)  . OAB (overactive bladder)     or BPH?  Marland Kitchen Borderline hypertension     Past Surgical History  Procedure Laterality Date  . Rhinoplasty  1997  . Right ear surgery for scar tissue removal  1996    There were no vitals filed for this visit.  Visit Diagnosis:  Pain in joint, ankle and foot, left  Stiffness of ankle joint, left  Ankle weakness  Abnormality of gait      Subjective Assessment - 03/23/15 0939    Subjective Pt is in the boot, has 3 more weeks. He is still using the nitro patch.    Currently in Pain? No/denies  the boot protects the ankle and it is ok                         [facility] Adult PT Treatment/Exercise - 03/23/15 0001    Modalities   Modalities Iontophoresis;Ultrasound   Ultrasound   Ultrasound Location Lt achilles and medial malleoli   Ultrasound Parameters 100% 3.3 mhz, 1.5 w/cm2   Ultrasound Goals Pain   Iontophoresis   Type of Iontophoresis Dexamethasone   Location Lt medial mid-achilles tendon   Dose 1.3 cc   Ankle Exercises: Aerobic   Stationary Bike Nustep L6 x5'   Ankle Exercises: Seated   BAPS Level 3;Sitting  12/6, 3/9 o'clock, circles   Other Seated Ankle Exercises 20reps eccentric inversion , green band                     PT Long Term Goals -  03/15/15 0947    PT LONG TERM GOAL #1   Title I with advanced HEP ( 03/29/15)   Status Achieved   PT LONG TERM GOAL #2   Title report pain decrease in Lt ankle to allow return to plyometric exercise ( 03/29/15)   Status On-going   PT LONG TERM GOAL #3   Title increase Lt ankle dorsiflexion =/> 15 degrees to assist with gait (03/29/15)   Status Achieved   PT LONG TERM GOAL #4   Title demo Lt ankle strength WNL (03/29/15)   PT LONG TERM GOAL #5   Title improve FOTO =/< 31% limited ( 03/29/15)   Status On-going  currently 42% limited               Plan - 03/23/15 1016    Clinical Impression Statement Pt reports the boot may be helping. It is still sore through the achilles.  He had the MRI last Friday and is waiting to hear the results.    Pt will benefit from skilled therapeutic intervention in order to improve on the following deficits Increased edema;Decreased strength;Pain;Abnormal gait;Decreased  range of motion;Difficulty walking   Rehab Potential Good   PT Frequency 2x / week   PT Duration 8 weeks   PT Treatment/Interventions Passive range of motion;Iontophoresis 4mg /ml Dexamethasone;Electrical Stimulation;Patient/family education;Cryotherapy;Neuromuscular re-education;Vasopneumatic Device;Manual techniques;Balance training;Ultrasound;Therapeutic exercise;Moist Heat   PT Next Visit Plan re-assess, see results of MRI and renew vs D/C    Consulted and Agree with Plan of Care Patient        Problem List Patient Active Problem List   Diagnosis Date Noted  . Anxiety and depression 02/15/2015  . Left Achilles tendinitis 01/18/2015  . Right lumbar radiculitis 09/30/2014  . Hiatal hernia 09/03/2014  . Elevated blood pressure 08/18/2014  . Hemangioma of liver 08/06/2014  . Exertional chest pain 06/22/2014  . Urinary obstruction 12/08/2013  . Right distal Biceps tendon tear 12/04/2012  . Annual physical exam 03/08/2012  . Bradycardia by electrocardiogram 12/20/2010  .  Preventative health care 10/19/2010  . Erectile dysfunction 10/19/2010  . Allergic rhinitis 10/19/2010  . COLITIS, ULCERATIVE NOS 05/27/2007    Jeral Pinch PT 03/23/2015, 10:20 AM  [facility] [place], [place], [address] Phone: [phone]   Fax:  [phone]

## 2015-03-23 NOTE — Telephone Encounter (Signed)
Patient called to find out if we have received the MRI report from [place]. Patient is wanting to schedule a follow up. Please advise.

## 2015-03-23 NOTE — Telephone Encounter (Signed)
Left detailed message on patient home vm with information as noted below, also advised patient to call the office and schedule an appt to go over report, and also advised him to get the images put on a disc and bring when he comes for his visit. Jamariyah Johannsen,CMA

## 2015-03-23 NOTE — Telephone Encounter (Signed)
Yes, we have the report, I can go over the report with him but I don't have any images.

## 2015-03-23 NOTE — Telephone Encounter (Signed)
Dr. Darene Lamer Do you recall seeing patients results, I do not see where anything is scanned in the chart and patient stated that he has already had MRI done. Aveya Beal,CMA

## 2015-03-25 ENCOUNTER — Encounter: Payer: Self-pay | Admitting: Sports Medicine

## 2015-03-25 ENCOUNTER — Ambulatory Visit (INDEPENDENT_AMBULATORY_CARE_PROVIDER_SITE_OTHER): Payer: BLUE CROSS/BLUE SHIELD | Admitting: Sports Medicine

## 2015-03-25 VITALS — BP 108/68 | HR 63 | Wt 187.0 lb

## 2015-03-25 DIAGNOSIS — M7662 Achilles tendinitis, left leg: Secondary | ICD-10-CM | POA: Diagnosis not present

## 2015-03-25 NOTE — Assessment & Plan Note (Signed)
Still in pain but better with boot immobilization. Patient will look into PRP. If covered he will come back to the injection, if not we will look at him again in 3 weeks, and likely do a saline injection/sclerotherapy.

## 2015-03-25 NOTE — Progress Notes (Signed)
  Subjective:    CC: MRI follow-up  HPI: This is a pleasant 49 year old male, he comes in with 3-4 months now of symptoms of Achilles tendinosis, we started physical therapy, topical nitroglycerin, heel lifts, he improved slightly but ultimately the last visit we placed him back in a boot. He continues to have symptoms, he did start to have some medial tendon pain as well as we obtained an MRI for confirmation, the MRI results will be dictated below, he is out of work right now, and amenable to try interventional treatment next.  Past medical history, Surgical history, Family history not pertinant except as noted below, Social history, Allergies, and medications have been entered into the medical record, reviewed, and no changes needed.   Review of Systems: No fevers, chills, night sweats, weight loss, chest pain, or shortness of breath.   Objective:    General: Well Developed, well nourished, and in no acute distress.  Neuro: Alert and oriented x3, extra-ocular muscles intact, sensation grossly intact.  HEENT: Normocephalic, atraumatic, pupils equal round reactive to light, neck supple, no masses, no lymphadenopathy, thyroid nonpalpable.  Skin: Warm and dry, no rashes. Cardiac: Regular rate and rhythm, no murmurs rubs or gallops, no lower extremity edema.  Respiratory: Clear to auscultation bilaterally. Not using accessory muscles, speaking in full sentences. Left ankle: Visibly swollen Achilles nodule with tenderness.  MRI shows mild hypertrophic Achilles tendinosis, mild talonavicular osteoarthritis and nothing else.  Impression and Recommendations:

## 2015-03-29 ENCOUNTER — Encounter: Payer: BLUE CROSS/BLUE SHIELD | Admitting: Physical Therapy

## 2015-03-29 ENCOUNTER — Telehealth: Payer: Self-pay

## 2015-03-30 NOTE — Telephone Encounter (Signed)
Ov note has been faxed to patient disability @ [phone] with claim #[id number]. Jaelani Posa,CMA

## 2015-04-05 ENCOUNTER — Encounter: Payer: Self-pay | Admitting: Physical Therapy

## 2015-04-05 ENCOUNTER — Ambulatory Visit (INDEPENDENT_AMBULATORY_CARE_PROVIDER_SITE_OTHER): Payer: BLUE CROSS/BLUE SHIELD | Admitting: Physical Therapy

## 2015-04-05 DIAGNOSIS — R29898 Other symptoms and signs involving the musculoskeletal system: Secondary | ICD-10-CM

## 2015-04-05 DIAGNOSIS — M25672 Stiffness of left ankle, not elsewhere classified: Secondary | ICD-10-CM | POA: Diagnosis not present

## 2015-04-05 DIAGNOSIS — M25572 Pain in left ankle and joints of left foot: Secondary | ICD-10-CM | POA: Diagnosis not present

## 2015-04-05 DIAGNOSIS — M259 Joint disorder, unspecified: Secondary | ICD-10-CM

## 2015-04-05 NOTE — Therapy (Signed)
[place], [place], [address] Phone: [phone]   Fax:  [phone]  Physical Therapy Treatment  Patient Details  Name: Benjamin Leonard MRN: [id number] Date of Birth: [birth year] Referring Provider:  Silverio Decamp,*  Encounter Date: 04/05/2015      PT End of Session - 04/05/15 0926    PT Start Time 0845   PT Stop Time 0927   PT Time Calculation (min) 42 min      Past Medical History  Diagnosis Date  . Ulcerative colitis   . Hearing loss     Right ear w/ hearing aid (new one in 2008)  . OAB (overactive bladder)     or BPH?  Marland Kitchen Borderline hypertension     Past Surgical History  Procedure Laterality Date  . Rhinoplasty  1997  . Right ear surgery for scar tissue removal  1996    There were no vitals filed for this visit.  Visit Diagnosis:  Pain in joint, ankle and foot, left  Stiffness of ankle joint, left  Ankle weakness      Subjective Assessment - 04/05/15 0851    Subjective Pt saw MD last week , had results of MRI, MD recommend PRP however his insurance doesn't cover this so he may have another type of injection.  He contiues to wear boot untli the 16th.    Currently in Pain? No/denies   Pain Score 3    Pain Location Ankle   Pain Orientation Left  achilles   Pain Descriptors / Indicators Aching   Pain Type Acute pain   Pain Onset More than a month ago   Pain Frequency Intermittent   Aggravating Factors  prolonged walking   Pain Relieving Factors rest            [facility] PT Assessment - 04/05/15 0001    Assessment   Medical Diagnosis Lt achilles tendonitis   Onset Date/Surgical Date 11/21/14   Next MD Visit 04/15/15   Precautions   Required Braces or Orthoses --  CAM boot   Observation/Other Assessments   Focus on Therapeutic Outcomes (FOTO)  47% limited   ROM / Strength   AROM / PROM / Strength AROM   AROM   AROM Assessment Site Ankle   Right/Left Ankle  Left   Left Ankle Dorsiflexion 11   Left Ankle Plantar Flexion 38   Left Ankle Inversion 33   Left Ankle Eversion 28   PROM   PROM Assessment Site --   Right/Left Knee --   Strength   Strength Assessment Site Ankle   Right/Left Ankle Left   Left Ankle Dorsiflexion 5/5   Left Ankle Plantar Flexion 4/5   Left Ankle Inversion 5/5   Left Ankle Eversion --  5-/5 with some discomfort                     [facility] Adult PT Treatment/Exercise - 04/05/15 0001    Modalities   Modalities Iontophoresis;Ultrasound   Ultrasound   Ultrasound Location Lt achilles   Ultrasound Parameters combo, 50%   Ultrasound Goals Pain   Iontophoresis   Type of Iontophoresis Dexamethasone   Location Lt medial mid-achilles tendon   Dose 1.3 cc   Time 12 hour patch    Ankle Exercises: Aerobic   Stationary Bike L3 x 5'   Ankle Exercises: Standing   SLS Lt with FWD leans x 10   Ankle Exercises: Seated   Heel Raises 20  reps  PF with yellow band   Toe Raise 20 reps  DF with yellow band                     PT Long Term Goals - 04/05/15 0904    PT LONG TERM GOAL #1   Title I with advanced HEP ( 03/29/15)   Status Achieved   PT LONG TERM GOAL #2   Title report pain decrease in Lt ankle to allow return to plyometric exercise ( 03/29/15)   Status On-going   PT LONG TERM GOAL #3   Title increase Lt ankle dorsiflexion =/> 15 degrees to assist with gait (03/29/15)   Status Achieved   PT LONG TERM GOAL #4   Title demo Lt ankle strength WNL (03/29/15)   Status Partially Met   PT LONG TERM GOAL #5   Title improve FOTO =/< 31% limited ( 03/29/15)   Status On-going               Plan - 04/05/15 4287    Clinical Impression Statement Pt continues with the boot, returns to the MD on 04/15/15. No new goals met.  His ROM is WNL, strength is limited due to pain.  He is hypsersensitive to touch in the Lt achilles today.      Rehab Potential Good   PT Next Visit Plan Place on hold until  he ses the MD on the 04/15/15, He will most likely have some type of an injection.  If Md wishes for him to return we will restart PT   Consulted and Agree with Plan of Care Patient        Problem List Patient Active Problem List   Diagnosis Date Noted  . Anxiety and depression 02/15/2015  . Left Achilles tendinitis 01/18/2015  . Right lumbar radiculitis 09/30/2014  . Hiatal hernia 09/03/2014  . Elevated blood pressure 08/18/2014  . Hemangioma of liver 08/06/2014  . Exertional chest pain 06/22/2014  . Urinary obstruction 12/08/2013  . Right distal Biceps tendon tear 12/04/2012  . Annual physical exam 03/08/2012  . Bradycardia by electrocardiogram 12/20/2010  . Preventative health care 10/19/2010  . Erectile dysfunction 10/19/2010  . Allergic rhinitis 10/19/2010  . COLITIS, ULCERATIVE NOS 05/27/2007    Jeral Pinch PT 04/05/2015, 9:30 AM  [facility] [place], [place], [address] Phone: [phone]   Fax:  [phone]

## 2015-04-15 ENCOUNTER — Ambulatory Visit (INDEPENDENT_AMBULATORY_CARE_PROVIDER_SITE_OTHER): Payer: BLUE CROSS/BLUE SHIELD | Admitting: Sports Medicine

## 2015-04-15 ENCOUNTER — Encounter: Payer: Self-pay | Admitting: Sports Medicine

## 2015-04-15 VITALS — BP 118/72 | HR 75 | Ht 72.0 in | Wt 187.0 lb

## 2015-04-15 DIAGNOSIS — S86811A Strain of other muscle(s) and tendon(s) at lower leg level, right leg, initial encounter: Secondary | ICD-10-CM | POA: Diagnosis not present

## 2015-04-15 DIAGNOSIS — M7662 Achilles tendinitis, left leg: Secondary | ICD-10-CM | POA: Diagnosis not present

## 2015-04-15 DIAGNOSIS — S86111A Strain of other muscle(s) and tendon(s) of posterior muscle group at lower leg level, right leg, initial encounter: Secondary | ICD-10-CM | POA: Insufficient documentation

## 2015-04-15 NOTE — Progress Notes (Signed)
  Subjective:    CC: Follow-up  HPI: Left Achilles tendinosis: At this point is almost pain-free in the boot, is eager to get back into aggressive physical therapy. Does continue to use his topical nitroglycerin.  Gastrocnemius strain: Took a misstep a few days ago, had some bruising over the calf but overall this is resolving.  Past medical history, Surgical history, Family history not pertinant except as noted below, Social history, Allergies, and medications have been entered into the medical record, reviewed, and no changes needed.   Review of Systems: No fevers, chills, night sweats, weight loss, chest pain, or shortness of breath.   Objective:    General: Well Developed, well nourished, and in no acute distress.  Neuro: Alert and oriented x3, extra-ocular muscles intact, sensation grossly intact.  HEENT: Normocephalic, atraumatic, pupils equal round reactive to light, neck supple, no masses, no lymphadenopathy, thyroid nonpalpable.  Skin: Warm and dry, no rashes. Cardiac: Regular rate and rhythm, no murmurs rubs or gallops, no lower extremity edema.  Respiratory: Clear to auscultation bilaterally. Not using accessory muscles, speaking in full sentences. Left ankle: Still with an Achilles nodule that is fairly tender. Right calf: No bruising present, tender to palpation at the mid medial gastrocnemius muscle belly. Negative Thompson's test.  Right calf was strapped with compressive dressing.  Impression and Recommendations:    I spent 25 minutes with this patient, greater than 50% was face-to-face time counseling regarding the above diagnoses

## 2015-04-15 NOTE — Assessment & Plan Note (Signed)
Strapped with compressive dressing, heel lift, return as needed for this.

## 2015-04-15 NOTE — Assessment & Plan Note (Signed)
Has followed somewhat of a sinusoidal course. He has not been a boot for another month, and is feeling relatively good but still has significant tenderness over the Achilles nodule. Continue topical nitroglycerin, we are going to discontinue the boot and he will simply do a heel lift, and additional aggressive eccentric physical therapy with minimal stretching. I will keep him out of work for an additional 3 weeks.

## 2015-04-18 ENCOUNTER — Ambulatory Visit (INDEPENDENT_AMBULATORY_CARE_PROVIDER_SITE_OTHER): Payer: BLUE CROSS/BLUE SHIELD | Admitting: Physical Therapy

## 2015-04-18 DIAGNOSIS — M259 Joint disorder, unspecified: Secondary | ICD-10-CM

## 2015-04-18 DIAGNOSIS — M25572 Pain in left ankle and joints of left foot: Secondary | ICD-10-CM | POA: Diagnosis not present

## 2015-04-18 DIAGNOSIS — M25672 Stiffness of left ankle, not elsewhere classified: Secondary | ICD-10-CM | POA: Diagnosis not present

## 2015-04-18 DIAGNOSIS — R29898 Other symptoms and signs involving the musculoskeletal system: Secondary | ICD-10-CM

## 2015-04-18 NOTE — Therapy (Signed)
[place], [place], [address] Phone: [phone]   Fax:  [phone]  Physical Therapy Treatment  Patient Details  Name: Benjamin Leonard MRN: [id number] Date of Birth: 1966/07/24 Referring Provider:  Silverio Decamp,*  Encounter Date: 04/18/2015      PT End of Session - 04/18/15 1152    Visit Number 9   Number of Visits 12   Date for PT Re-Evaluation 05/16/15   PT Start Time 3817   PT Stop Time 1234   PT Time Calculation (min) 46 min   Activity Tolerance Patient tolerated treatment well      Past Medical History  Diagnosis Date  . Ulcerative colitis   . Hearing loss     Right ear w/ hearing aid (new one in 2008)  . OAB (overactive bladder)     or BPH?  Marland Kitchen Borderline hypertension     Past Surgical History  Procedure Laterality Date  . Rhinoplasty  1997  . Right ear surgery for scar tissue removal  1996    There were no vitals filed for this visit.  Visit Diagnosis:  Pain in joint, ankle and foot, left - Plan: PT plan of care cert/re-cert  Stiffness of ankle joint, left - Plan: PT plan of care cert/re-cert  Ankle weakness - Plan: PT plan of care cert/re-cert      Subjective Assessment - 04/18/15 1150    Subjective Saw MD and he is out of the boot now, since Friday.  Has been walking the dog, working and watching the ball games over the weekend. Then Sunday AM the achilles was so sore he was having trouble walking, stretch it out and now the pain is much better.    Currently in Pain? Yes   Pain Score 2    Pain Location Ankle   Pain Orientation Left   Pain Descriptors / Indicators Dull   Pain Type Acute pain   Pain Frequency Intermittent   Aggravating Factors  not sure what happened to make it worse.    Pain Relieving Factors stretching.             [facility] PT Assessment - 04/18/15 0001    Assessment   Medical Diagnosis Lt achilles tendonitis   Onset Date/Surgical  Date 11/21/14   Next MD Visit 05/06/15   AROM   Left Ankle Dorsiflexion 10   Left Ankle Plantar Flexion --  WNL   Left Ankle Inversion --  WNL   Left Ankle Eversion --  WNL   Strength   Strength Assessment Site Ankle   Right/Left Ankle Left   Left Ankle Dorsiflexion --  5-/5   Left Ankle Plantar Flexion 4/5   Left Ankle Inversion 5/5   Left Ankle Eversion 5/5                     [facility] Adult PT Treatment/Exercise - 04/18/15 0001    Exercises   Exercises Ankle   Modalities   Modalities Iontophoresis;Ultrasound   Ultrasound   Ultrasound Location Lt achilles    Ultrasound Parameters static, 20%, 0.80 w/cm2   Ultrasound Goals Pain   Iontophoresis   Type of Iontophoresis Dexamethasone   Location Lt medial mid-achilles tendon   Dose 1.3 cc   Time 12 hour patch    Manual Therapy   Soft tissue mobilization cross friction Lt achilles, focus medially and post to medial malleoili, cross fiber to post tib/FDL    Other Manual  Therapy ice massage.    Ankle Exercises: Aerobic   Stationary Bike L2x5'   Ankle Exercises: Standing   BAPS Level 2;Standing;10 reps  12/6, 3/9,clock and CCW   Heel Raises 10 reps  off step, bilat, eccentric focus   Ankle Exercises: Seated   Other Seated Ankle Exercises theraband, greenband eversion, DF/PF                     PT Long Term Goals - 04/18/15 1153    PT LONG TERM GOAL #1   Title I with advanced HEP ( 05/16/15)   Time 4   Period Weeks   Status On-going   PT LONG TERM GOAL #2   Title report pain decrease in Lt ankle to allow return to plyometric exercise ( 05/16/15)   Time 4   Period Weeks   Status On-going   PT LONG TERM GOAL #3   Title increase Lt ankle dorsiflexion =/> 15 degrees to assist with gait (05/16/15)   Time 4   Period Weeks   Status On-going   PT LONG TERM GOAL #4   Title demo Lt ankle strength WNL (05/16/15)   Status Partially Met   PT LONG TERM GOAL #5   Title improve FOTO =/< 31% limited  ( 05/16/15)   Time 4   Period Weeks   Status On-going               Plan - 04/18/15 1234    Clinical Impression Statement Pt is now out of the boot, returning for more aggressive PT, he only lost  degrees of DF while in the boot, the pain has decreased however he is still sensitive to toucn medial achilles Lt, He continues to have higher level weakness .   Pt will benefit from skilled therapeutic intervention in order to improve on the following deficits Increased edema;Decreased strength;Pain;Abnormal gait;Decreased range of motion;Difficulty walking   Rehab Potential Good   PT Frequency 2x / week   PT Duration 4 weeks   PT Treatment/Interventions Passive range of motion;Iontophoresis 9m/ml Dexamethasone;Electrical Stimulation;Patient/family education;Cryotherapy;Neuromuscular re-education;Vasopneumatic Device;Manual techniques;Balance training;Ultrasound;Therapeutic exercise;Moist Heat   PT Next Visit Plan slowly add in HEP for ankle   Consulted and Agree with Plan of Care Patient        Problem List Patient Active Problem List   Diagnosis Date Noted  . Strain of right gastrocnemius muscle 04/15/2015  . Anxiety and depression 02/15/2015  . Left Achilles tendinitis 01/18/2015  . Right lumbar radiculitis 09/30/2014  . Hiatal hernia 09/03/2014  . Elevated blood pressure 08/18/2014  . Hemangioma of liver 08/06/2014  . Exertional chest pain 06/22/2014  . Urinary obstruction 12/08/2013  . Right distal Biceps tendon tear 12/04/2012  . Annual physical exam 03/08/2012  . Bradycardia by electrocardiogram 12/20/2010  . Preventative health care 10/19/2010  . Erectile dysfunction 10/19/2010  . Allergic rhinitis 10/19/2010  . COLITIS, ULCERATIVE NOS 05/27/2007    SJeral PinchPT 04/18/2015, 12:48 PM  [facility] [address]Phone: [phone]  Fax:  [phone]

## 2015-04-20 ENCOUNTER — Ambulatory Visit (INDEPENDENT_AMBULATORY_CARE_PROVIDER_SITE_OTHER): Payer: BLUE CROSS/BLUE SHIELD | Admitting: Physical Therapy

## 2015-04-20 DIAGNOSIS — M259 Joint disorder, unspecified: Secondary | ICD-10-CM

## 2015-04-20 DIAGNOSIS — M25672 Stiffness of left ankle, not elsewhere classified: Secondary | ICD-10-CM

## 2015-04-20 DIAGNOSIS — M25572 Pain in left ankle and joints of left foot: Secondary | ICD-10-CM | POA: Diagnosis not present

## 2015-04-20 DIAGNOSIS — R29898 Other symptoms and signs involving the musculoskeletal system: Secondary | ICD-10-CM

## 2015-04-20 NOTE — Therapy (Signed)
[place], [place], [address] Phone: [phone]   Fax:  [phone]  Physical Therapy Treatment  Patient Details  Name: Benjamin Leonard MRN: [id number] Date of Birth: [birth year] Referring Provider:  Silverio Decamp,*  Encounter Date: 04/20/2015      PT End of Session - 04/20/15 1023    Visit Number 10   Number of Visits 12   Date for PT Re-Evaluation 05/16/15   PT Start Time 6286   PT Stop Time 1105   PT Time Calculation (min) 50 min   Activity Tolerance Patient tolerated treatment well;No increased pain      Past Medical History  Diagnosis Date  . Ulcerative colitis   . Hearing loss     Right ear w/ hearing aid (new one in 2008)  . OAB (overactive bladder)     or BPH?  Marland Kitchen Borderline hypertension     Past Surgical History  Procedure Laterality Date  . Rhinoplasty  1997  . Right ear surgery for scar tissue removal  1996    There were no vitals filed for this visit.  Visit Diagnosis:  Pain in joint, ankle and foot, left  Stiffness of ankle joint, left  Ankle weakness      Subjective Assessment - 04/20/15 1023    Subjective Pt reported he had massage yesterday and did gentle stretches.  He said he was sore the afternoon after therapy session, but not too bad.    Currently in Pain? No/denies         [facility] PT Assessment - 04/20/15 0001    Assessment   Medical Diagnosis Lt achilles tendonitis   Onset Date/Surgical Date 11/21/14   Next MD Visit 05/06/15         [facility] Adult PT Treatment/Exercise - 04/20/15 0001    Modalities   Modalities Iontophoresis   Ultrasound   Ultrasound Location Lt achilles    Ultrasound Parameters static, 20%, 0.80 w/cm2 x 8 min    Ultrasound Goals Pain   Iontophoresis   Type of Iontophoresis Dexamethasone   Location Lt medial mid-achilles tendon   Dose 1.3 cc   Time 12 hour patch    Manual Therapy   Soft tissue mobilization cross  friction Lt achilles, focus medially and post to medial malleoili, cross fiber to post tib/FDL    Other Manual Therapy ice massage.    Ankle Exercises: Standing   BAPS Level 2;Standing;10 reps  12/6, 3/9,clock and CCW   Heel Raises 10 reps  off step, bilat, eccentric focus   Ankle Exercises: Stretches   Soleus Stretch 2 reps;30 seconds   Gastroc Stretch 2 reps;30 seconds   Ankle Exercises: Aerobic   Stationary Bike L2x5'   Ankle Exercises: Seated   Other Seated Ankle Exercises theraband, greenband eversion, DF.  2 sets of 10.           PT Education - 04/20/15 1120    Education provided Yes   Education Details HEP: pt to perform 2 sets of 10 DF and eversion with green band and 1 set of 10 heel raises with eccentric lowering; every other day.    Person(s) Educated Patient   Methods Explanation   Comprehension Verbalized understanding;Returned demonstration             PT Long Term Goals - 04/18/15 1153    PT LONG TERM GOAL #1   Title I with advanced HEP ( 05/16/15)   Time 4   Period  Weeks   Status On-going   PT LONG TERM GOAL #2   Title report pain decrease in Lt ankle to allow return to plyometric exercise ( 05/16/15)   Time 4   Period Weeks   Status On-going   PT LONG TERM GOAL #3   Title increase Lt ankle dorsiflexion =/> 15 degrees to assist with gait (05/16/15)   Time 4   Period Weeks   Status On-going   PT LONG TERM GOAL #4   Title demo Lt ankle strength WNL (05/16/15)   Status Partially Met   PT LONG TERM GOAL #5   Title improve FOTO =/< 31% limited ( 05/16/15)   Time 4   Period Weeks   Status On-going               Plan - 04/20/15 1123    Clinical Impression Statement Pt tolerated exercises well, without any production of pain.  Pt is still sensitive with touch to medial aspect of Lt achilles tendon.  No goals met this visit.    Pt will benefit from skilled therapeutic intervention in order to improve on the following deficits Increased  edema;Decreased strength;Pain;Abnormal gait;Decreased range of motion;Difficulty walking   Rehab Potential Good   PT Frequency 2x / week   PT Duration 4 weeks   PT Treatment/Interventions Passive range of motion;Iontophoresis 71m/ml Dexamethasone;Electrical Stimulation;Patient/family education;Cryotherapy;Neuromuscular re-education;Vasopneumatic Device;Manual techniques;Balance training;Ultrasound;Therapeutic exercise;Moist Heat   PT Next Visit Plan Assess response to HEP over past wk. Slowly add additional exercises for HEP.    Consulted and Agree with Plan of Care Patient        Problem List Patient Active Problem List   Diagnosis Date Noted  . Strain of right gastrocnemius muscle 04/15/2015  . Anxiety and depression 02/15/2015  . Left Achilles tendinitis 01/18/2015  . Right lumbar radiculitis 09/30/2014  . Hiatal hernia 09/03/2014  . Elevated blood pressure 08/18/2014  . Hemangioma of liver 08/06/2014  . Exertional chest pain 06/22/2014  . Urinary obstruction 12/08/2013  . Right distal Biceps tendon tear 12/04/2012  . Annual physical exam 03/08/2012  . Bradycardia by electrocardiogram 12/20/2010  . Preventative health care 10/19/2010  . Erectile dysfunction 10/19/2010  . Allergic rhinitis 10/19/2010  . COLITIS, ULCERATIVE NOS 05/27/2007   JKerin Perna PTA 04/20/2015 11:30 AM  [place] [address]Phone: [phone]  Fax:  [phone]

## 2015-04-25 ENCOUNTER — Encounter: Payer: Self-pay | Admitting: Rehabilitative and Restorative Service Providers"

## 2015-04-25 ENCOUNTER — Ambulatory Visit (INDEPENDENT_AMBULATORY_CARE_PROVIDER_SITE_OTHER): Payer: BLUE CROSS/BLUE SHIELD | Admitting: Rehabilitative and Restorative Service Providers"

## 2015-04-25 DIAGNOSIS — M25572 Pain in left ankle and joints of left foot: Secondary | ICD-10-CM | POA: Diagnosis not present

## 2015-04-25 DIAGNOSIS — M259 Joint disorder, unspecified: Secondary | ICD-10-CM

## 2015-04-25 DIAGNOSIS — R269 Unspecified abnormalities of gait and mobility: Secondary | ICD-10-CM

## 2015-04-25 DIAGNOSIS — R29898 Other symptoms and signs involving the musculoskeletal system: Secondary | ICD-10-CM

## 2015-04-25 DIAGNOSIS — M25672 Stiffness of left ankle, not elsewhere classified: Secondary | ICD-10-CM

## 2015-04-25 NOTE — Therapy (Signed)
[place], [place], [address] Phone: [phone]   Fax:  [phone]  Physical Therapy Treatment  Patient Details  Name: Benjamin Leonard MRN: [id number] Date of Birth: Nov 07, 1965 Referring Provider:  Silverio Decamp,*  Encounter Date: 04/25/2015      PT End of Session - 04/25/15 1314    Visit Number 11   Number of Visits 12   Date for PT Re-Evaluation 05/16/15   PT Start Time 1017   PT Stop Time 1109   PT Time Calculation (min) 52 min   Activity Tolerance Patient tolerated treatment well;No increased pain      Past Medical History  Diagnosis Date  . Ulcerative colitis   . Hearing loss     Right ear w/ hearing aid (new one in 2008)  . OAB (overactive bladder)     or BPH?  Marland Kitchen Borderline hypertension     Past Surgical History  Procedure Laterality Date  . Rhinoplasty  1997  . Right ear surgery for scar tissue removal  1996    There were no vitals filed for this visit.  Visit Diagnosis:  Pain in joint, ankle and foot, left  Stiffness of ankle joint, left  Ankle weakness  Abnormality of gait      Subjective Assessment - 04/25/15 1022    Subjective Feels he is improving. Notices that he has a little pain when he stands after he has been sitting for a little while and stands up to walk again.    Currently in Pain? Yes   Pain Score 2    Pain Location Ankle   Pain Orientation Left   Pain Descriptors / Indicators Tightness   Pain Type Acute pain   Pain Onset More than a month ago   Pain Frequency Intermittent   Aggravating Factors  standing to walk after he has been sitting    Pain Relieving Factors stretching             [facility] PT Assessment - 04/25/15 0001    Assessment   Medical Diagnosis Lt achilles tendonitis   Onset Date/Surgical Date 11/21/14   Next MD Visit 05/06/15   AROM   Left Ankle Dorsiflexion 12                     [facility] Adult PT  Treatment/Exercise - 04/25/15 0001    Exercises   Exercises Ankle   Modalities   Modalities Iontophoresis   Ultrasound   Ultrasound Location Lt achilles   Ultrasound Parameters static, 20%, 0.8 w/cm2; 8 min   Ultrasound Goals Pain   Iontophoresis   Type of Iontophoresis Dexamethasone   Location Lt medial mid-achilles tendon   Dose 1.3 cc   Time 12 hour patch    Manual Therapy   Other Manual Therapy ice massage.    Ankle Exercises: Aerobic   Stationary Bike L2x5'   Ankle Exercises: Stretches   Soleus Stretch 2 reps;30 seconds   Gastroc Stretch 2 reps;30 seconds   Ankle Exercises: Standing   BAPS Level 2;Standing;10 reps  12/6, 3/9,clock and CCW   Heel Raises 10 reps  from floor - watching position of foot/ankle    Ankle Exercises: Seated   Other Seated Ankle Exercises theraband, greenband eversion, DF.  2 sets of 10. - 1 set fast                 PT Education - 04/25/15 1312    Education Details  To work on quality of ankle dorsiflexion/heel raise in standing avoiding supination of feet. Education re sequence of loading LE before he stands to walk taking a few seconds to prepare Lt leg for weight bearing; added fast reps to TB exercises.    Person(s) Educated Patient   Methods Explanation;Demonstration;Verbal cues   Comprehension Verbalized understanding;Returned demonstration;Verbal cues required             PT Long Term Goals - 04/18/15 1153    PT LONG TERM GOAL #1   Title I with advanced HEP ( 05/16/15)   Time 4   Period Weeks   Status On-going   PT LONG TERM GOAL #2   Title report pain decrease in Lt ankle to allow return to plyometric exercise ( 05/16/15)   Time 4   Period Weeks   Status On-going   PT LONG TERM GOAL #3   Title increase Lt ankle dorsiflexion =/> 15 degrees to assist with gait (05/16/15)   Time 4   Period Weeks   Status On-going   PT LONG TERM GOAL #4   Title demo Lt ankle strength WNL (05/16/15)   Status Partially Met   PT LONG  TERM GOAL #5   Title improve FOTO =/< 31% limited ( 05/16/15)   Time 4   Period Weeks   Status On-going               Plan - 04/25/15 1030    Clinical Impression Statement Pt tolerates exercise well. Noted feet seem to be supinated in shoes with outside border of shoes more worn and weight bearing increased along the lateral border of the foot vs. equal weight distrubution. Noted deviation of forefoot to hindfoot with heel raises and incresaed difficulty maintaining equal weight bearing with SLS. Suggested focused work on these activities and possibly trial of slight lateral lift under custom inserts. Continued sensitivity to palpation medial Lt achilles tendon area. Progressing gradually toward goals of therapy.    Pt will benefit from skilled therapeutic intervention in order to improve on the following deficits Increased edema;Decreased strength;Pain;Abnormal gait;Decreased range of motion;Difficulty walking   Rehab Potential Good   PT Duration 4 weeks   PT Next Visit Plan Progress exercise as tolerated - progressing slowly to avoid flare up of symptoms    PT Home Exercise Plan HEP   Consulted and Agree with Plan of Care Patient        Problem List Patient Active Problem List   Diagnosis Date Noted  . Strain of right gastrocnemius muscle 04/15/2015  . Anxiety and depression 02/15/2015  . Left Achilles tendinitis 01/18/2015  . Right lumbar radiculitis 09/30/2014  . Hiatal hernia 09/03/2014  . Elevated blood pressure 08/18/2014  . Hemangioma of liver 08/06/2014  . Exertional chest pain 06/22/2014  . Urinary obstruction 12/08/2013  . Right distal Biceps tendon tear 12/04/2012  . Annual physical exam 03/08/2012  . Bradycardia by electrocardiogram 12/20/2010  . Preventative health care 10/19/2010  . Erectile dysfunction 10/19/2010  . Allergic rhinitis 10/19/2010  . COLITIS, ULCERATIVE NOS 05/27/2007    Celyn Nilda Simmer PT, MPH 04/25/2015, 1:20 PM  [facility] [place], [place], [address] Phone: [phone]   Fax:  [phone]

## 2015-04-27 ENCOUNTER — Ambulatory Visit (INDEPENDENT_AMBULATORY_CARE_PROVIDER_SITE_OTHER): Payer: BLUE CROSS/BLUE SHIELD | Admitting: Physical Therapy

## 2015-04-27 DIAGNOSIS — R269 Unspecified abnormalities of gait and mobility: Secondary | ICD-10-CM | POA: Diagnosis not present

## 2015-04-27 DIAGNOSIS — M25672 Stiffness of left ankle, not elsewhere classified: Secondary | ICD-10-CM

## 2015-04-27 DIAGNOSIS — M259 Joint disorder, unspecified: Secondary | ICD-10-CM | POA: Diagnosis not present

## 2015-04-27 DIAGNOSIS — R29898 Other symptoms and signs involving the musculoskeletal system: Secondary | ICD-10-CM

## 2015-04-27 DIAGNOSIS — M25572 Pain in left ankle and joints of left foot: Secondary | ICD-10-CM | POA: Diagnosis not present

## 2015-04-27 NOTE — Therapy (Signed)
[place], [place], [address] Phone: [phone]   Fax:  [phone]  Physical Therapy Treatment  Patient Details  Name: Benjamin Leonard MRN: [id number] Date of Birth: 1965/09/05 Referring Provider:  Silverio Decamp,*  Encounter Date: 04/27/2015      PT End of Session - 04/27/15 0942    Visit Number 12   Number of Visits 14   Date for PT Re-Evaluation 05/16/15   PT Start Time 0938   PT Stop Time 1022   PT Time Calculation (min) 44 min   Activity Tolerance Patient tolerated treatment well      Past Medical History  Diagnosis Date  . Ulcerative colitis   . Hearing loss     Right ear w/ hearing aid (new one in 2008)  . OAB (overactive bladder)     or BPH?  Marland Kitchen Borderline hypertension     Past Surgical History  Procedure Laterality Date  . Rhinoplasty  1997  . Right ear surgery for scar tissue removal  1996    There were no vitals filed for this visit.  Visit Diagnosis:  Pain in joint, ankle and foot, left  Stiffness of ankle joint, left  Ankle weakness  Abnormality of gait      Subjective Assessment - 04/27/15 0940    Subjective Pt reports he feels it is getting better, will have some stiffness in the ankle after sitting for longer periods of time.    Currently in Pain? Yes   Pain Score 1    Pain Location Ankle   Pain Orientation Left   Pain Descriptors / Indicators Tightness   Pain Type Acute pain   Aggravating Factors  prolonged standing, and being still for long periods of time.    Pain Relieving Factors stretching                         [facility] Adult PT Treatment/Exercise - 04/27/15 0001    Exercises   Exercises Ankle   Ultrasound   Ultrasound Location Lt achilles   Ultrasound Parameters static, 3.58mz, 20%, 0.80w/cm2   Ultrasound Goals Pain   Iontophoresis   Type of Iontophoresis Dexamethasone   Location Lt medial mid-achilles tendon   Dose 1.3 cc   Time 12 hour patch    Manual Therapy   Manual Therapy Soft tissue mobilization;Myofascial release   Soft tissue mobilization cross friction Lt achilles, focus medially and post to medial malleoili, cross fiber to post tib/FDL    Myofascial Release worked into lSports administrator    Ankle Exercises: Stretches   Soleus Stretch 2 reps;30 seconds   Gastroc Stretch 2 reps;30 seconds   Ankle Exercises: Aerobic   Stationary Bike L2x5'   Ankle Exercises: Standing   Toe Raise 10 reps  each, toes in/out/straight                     PT Long Term Goals - 04/18/15 1153    PT LONG TERM GOAL #1   Title I with advanced HEP ( 05/16/15)   Time 4   Period Weeks   Status On-going   PT LONG TERM GOAL #2   Title report pain decrease in Lt ankle to allow return to plyometric exercise ( 05/16/15)   Time 4   Period Weeks   Status On-going   PT LONG TERM GOAL #3   Title increase Lt ankle dorsiflexion =/> 15 degrees to assist with  gait (05/16/15)   Time 4   Period Weeks   Status On-going   PT LONG TERM GOAL #4   Title demo Lt ankle strength WNL (05/16/15)   Status Partially Met   PT LONG TERM GOAL #5   Title improve FOTO =/< 31% limited ( 05/16/15)   Time 4   Period Weeks   Status On-going               Plan - 04/27/15 1025    Clinical Impression Statement Pt continues to have slow improvements. Pain is decreasing, ROM is still good. Had some new trigger point/tightness in the lateral aspect of the Lt gastroc. Adding in ther ex slowly.    Pt will benefit from skilled therapeutic intervention in order to improve on the following deficits Increased edema;Decreased strength;Pain;Abnormal gait;Decreased range of motion;Difficulty walking   Rehab Potential Good   PT Frequency 2x / week   PT Duration 4 weeks   PT Treatment/Interventions Passive range of motion;Iontophoresis 8m/ml Dexamethasone;Electrical Stimulation;Patient/family education;Cryotherapy;Neuromuscular  re-education;Vasopneumatic Device;Manual techniques;Balance training;Ultrasound;Therapeutic exercise;Moist Heat   PT Next Visit Plan Goes to the MD next Friday , will need note for that appointment.    Consulted and Agree with Plan of Care Patient        Problem List Patient Active Problem List   Diagnosis Date Noted  . Strain of right gastrocnemius muscle 04/15/2015  . Anxiety and depression 02/15/2015  . Left Achilles tendinitis 01/18/2015  . Right lumbar radiculitis 09/30/2014  . Hiatal hernia 09/03/2014  . Elevated blood pressure 08/18/2014  . Hemangioma of liver 08/06/2014  . Exertional chest pain 06/22/2014  . Urinary obstruction 12/08/2013  . Right distal Biceps tendon tear 12/04/2012  . Annual physical exam 03/08/2012  . Bradycardia by electrocardiogram 12/20/2010  . Preventative health care 10/19/2010  . Erectile dysfunction 10/19/2010  . Allergic rhinitis 10/19/2010  . COLITIS, ULCERATIVE NOS 05/27/2007    SJeral PinchPT 04/27/2015, 10:31 AM  [facility] [address]Phone: [phone]  Fax:  [phone]

## 2015-05-04 ENCOUNTER — Encounter: Payer: Self-pay | Admitting: Rehabilitative and Restorative Service Providers"

## 2015-05-04 ENCOUNTER — Ambulatory Visit (INDEPENDENT_AMBULATORY_CARE_PROVIDER_SITE_OTHER): Payer: BLUE CROSS/BLUE SHIELD | Admitting: Rehabilitative and Restorative Service Providers"

## 2015-05-04 DIAGNOSIS — M259 Joint disorder, unspecified: Secondary | ICD-10-CM | POA: Diagnosis not present

## 2015-05-04 DIAGNOSIS — M25672 Stiffness of left ankle, not elsewhere classified: Secondary | ICD-10-CM

## 2015-05-04 DIAGNOSIS — R29898 Other symptoms and signs involving the musculoskeletal system: Secondary | ICD-10-CM

## 2015-05-04 DIAGNOSIS — M25572 Pain in left ankle and joints of left foot: Secondary | ICD-10-CM

## 2015-05-04 DIAGNOSIS — R269 Unspecified abnormalities of gait and mobility: Secondary | ICD-10-CM

## 2015-05-04 NOTE — Therapy (Addendum)
[place], [place], [address] Phone: [phone]   Fax:  [phone]  Physical Therapy Treatment  Patient Details  Name: Benjamin Leonard MRN: [id number] Date of Birth: [birth year] Referring Provider:  Silverio Decamp,*  Encounter Date: 05/04/2015      PT End of Session - 05/04/15 1021    Visit Number 13   Number of Visits 14   Date for PT Re-Evaluation 05/16/15   PT Start Time 1018   PT Stop Time 1108   PT Time Calculation (min) 50 min   Activity Tolerance Patient tolerated treatment well      Past Medical History  Diagnosis Date  . Ulcerative colitis   . Hearing loss     Right ear w/ hearing aid (new one in 2008)  . OAB (overactive bladder)     or BPH?  Marland Kitchen Borderline hypertension     Past Surgical History  Procedure Laterality Date  . Rhinoplasty  1997  . Right ear surgery for scar tissue removal  1996    There were no vitals filed for this visit.  Visit Diagnosis:  Pain in joint, ankle and foot, left  Stiffness of ankle joint, left  Ankle weakness  Abnormality of gait      Subjective Assessment - 05/04/15 1022    Subjective Feeling better - only time he feels it is when he initially stands after having been in sitting for prolonged period of time or getting out of the car. Has not returned to work or resumed all his activities, but has increased his activity level at home.    Currently in Pain? Yes   Pain Score 1    Pain Location Ankle   Pain Orientation Left   Pain Descriptors / Indicators Tightness   Pain Type Acute pain   Pain Onset More than a month ago   Pain Frequency Intermittent   Aggravating Factors  standing after prolonged sitting    Pain Relieving Factors stretching            [facility] PT Assessment - 05/04/15 0001    Assessment   Medical Diagnosis Lt achilles tendonitis   Onset Date/Surgical Date 11/21/14   Next MD Visit 05/06/15   Observation/Other Assessments   Focus on Therapeutic Outcomes (FOTO)  32% limitation   AROM   Left Ankle Dorsiflexion 15   Left Ankle Plantar Flexion --  WNL   Left Ankle Inversion --  WNL   Left Ankle Eversion --  WNL   Strength   Strength Assessment Site Ankle   Right/Left Ankle Left   Left Ankle Dorsiflexion 5/5   Left Ankle Plantar Flexion --  5-/5   Left Ankle Inversion 5/5   Left Ankle Eversion 5/5   Palpation   Palpation comment tender achilles tendon and lateral tendon area distally and lateral gastroc through muscle belly                     [facility] Adult PT Treatment/Exercise - 05/04/15 0001    Exercises   Exercises Ankle   Cryotherapy   Number Minutes Cryotherapy 15 Minutes   Cryotherapy Location Ankle  gastroc/soleus   Type of Cryotherapy Ice pack   Ultrasound   Ultrasound Location Lt achilles   Ultrasound Parameters static; 50%; 3.36mHz; .8 w/cm2   Ultrasound Goals Pain   Manual Therapy   Manual Therapy Soft tissue mobilization;Myofascial release   Soft tissue mobilization cross friction Lt achilles, focus medially  and post to medial malleoili, cross fiber to post tib/FDL    Myofascial Release worked into Sports administrator.    Ankle Exercises: Stretches   Soleus Stretch 2 reps;30 seconds   Gastroc Stretch 2 reps;30 seconds   Ankle Exercises: Aerobic   Stationary Bike Nustep L5x5'   Ankle Exercises: Standing   BAPS Level 2;Standing;10 reps   SLS Lt 1 min x2 reps; Lt with FWD leans x 10   Heel Raises 10 reps  2 sets   Toe Raise 10 reps  each, toes in/out/straight   Ankle Exercises: Seated   Other Seated Ankle Exercises theraband, greenband eversion, DF.  2 sets of 10. - 1 set fast                 PT Education - 05/04/15 1102    Education provided Yes   Education Details cont work on quality of ankle DF in standing; loading foot/ankle in weight bearing before standing to walk; add SLS and SLS with forward leans   Person(s) Educated  Patient   Methods Explanation;Demonstration   Comprehension Verbalized understanding             PT Long Term Goals - 05/04/15 1118    PT LONG TERM GOAL #1   Title I with advanced HEP ( 05/16/15)   Time 4   Period Weeks   Status Achieved   PT LONG TERM GOAL #2   Title report pain decrease in Lt ankle to allow return to plyometric exercise ( 05/16/15)   Time 4   Period Weeks   Status On-going   PT LONG TERM GOAL #3   Title increase Lt ankle dorsiflexion =/> 15 degrees to assist with gait (05/16/15)   Time 4   Period Weeks   Status Achieved   PT LONG TERM GOAL #4   Title demo Lt ankle strength WNL (05/16/15)   Time 8   Period Weeks   Status Achieved   PT LONG TERM GOAL #5   Title improve FOTO =/< 31% limited ( 05/16/15)   Baseline FOTO 32% limitation   Time 4   Period Weeks   Status Partially Met               Plan - 05/04/15 1104    Clinical Impression Statement Continued gradual progress. Tender lateral achilles and lateral gastroc with manual work. Focus on closed chain weight bearing exercise to progress strengthening   Pt will benefit from skilled therapeutic intervention in order to improve on the following deficits Increased edema;Decreased strength;Pain;Abnormal gait;Decreased range of motion;Difficulty walking   Rehab Potential Good   PT Frequency 2x / week   PT Duration 4 weeks   PT Treatment/Interventions Passive range of motion;Iontophoresis 55m/ml Dexamethasone;Electrical Stimulation;Patient/family education;Cryotherapy;Neuromuscular re-education;Vasopneumatic Device;Manual techniques;Balance training;Ultrasound;Therapeutic exercise;Moist Heat   PT Next Visit Plan Note to MD   PT Home Exercise Plan HEP   Consulted and Agree with Plan of Care Patient        Problem List Patient Active Problem List   Diagnosis Date Noted  . Strain of right gastrocnemius muscle 04/15/2015  . Anxiety and depression 02/15/2015  . Left Achilles tendinitis  01/18/2015  . Right lumbar radiculitis 09/30/2014  . Hiatal hernia 09/03/2014  . Elevated blood pressure 08/18/2014  . Hemangioma of liver 08/06/2014  . Exertional chest pain 06/22/2014  . Urinary obstruction 12/08/2013  . Right distal Biceps tendon tear 12/04/2012  . Annual physical exam 03/08/2012  . Bradycardia by electrocardiogram 12/20/2010  . Preventative  health care 10/19/2010  . Erectile dysfunction 10/19/2010  . Allergic rhinitis 10/19/2010  . COLITIS, ULCERATIVE NOS 05/27/2007    Kateline Kinkade Nilda Simmer PT, MPH 05/04/2015, 11:31 AM  [facility] [place], [place], [address] Phone: [phone]   Fax:  [phone]

## 2015-05-06 ENCOUNTER — Encounter: Payer: Self-pay | Admitting: Sports Medicine

## 2015-05-06 ENCOUNTER — Ambulatory Visit (INDEPENDENT_AMBULATORY_CARE_PROVIDER_SITE_OTHER): Payer: BLUE CROSS/BLUE SHIELD | Admitting: Physical Therapy

## 2015-05-06 ENCOUNTER — Ambulatory Visit (INDEPENDENT_AMBULATORY_CARE_PROVIDER_SITE_OTHER): Payer: BLUE CROSS/BLUE SHIELD | Admitting: Sports Medicine

## 2015-05-06 VITALS — BP 130/84 | HR 84 | Ht 72.0 in | Wt 185.0 lb

## 2015-05-06 DIAGNOSIS — M25672 Stiffness of left ankle, not elsewhere classified: Secondary | ICD-10-CM

## 2015-05-06 DIAGNOSIS — F329 Major depressive disorder, single episode, unspecified: Secondary | ICD-10-CM

## 2015-05-06 DIAGNOSIS — R29898 Other symptoms and signs involving the musculoskeletal system: Secondary | ICD-10-CM

## 2015-05-06 DIAGNOSIS — K449 Diaphragmatic hernia without obstruction or gangrene: Secondary | ICD-10-CM

## 2015-05-06 DIAGNOSIS — F418 Other specified anxiety disorders: Secondary | ICD-10-CM | POA: Diagnosis not present

## 2015-05-06 DIAGNOSIS — M25572 Pain in left ankle and joints of left foot: Secondary | ICD-10-CM | POA: Diagnosis not present

## 2015-05-06 DIAGNOSIS — M259 Joint disorder, unspecified: Secondary | ICD-10-CM | POA: Diagnosis not present

## 2015-05-06 DIAGNOSIS — F419 Anxiety disorder, unspecified: Secondary | ICD-10-CM

## 2015-05-06 DIAGNOSIS — F32A Depression, unspecified: Secondary | ICD-10-CM

## 2015-05-06 DIAGNOSIS — M7662 Achilles tendinitis, left leg: Secondary | ICD-10-CM | POA: Diagnosis not present

## 2015-05-06 MED ORDER — ESOMEPRAZOLE MAGNESIUM 40 MG PO CPDR: 40.0000 mg | DELAYED_RELEASE_CAPSULE | Freq: Every day | ORAL | Status: DC

## 2015-05-06 MED ORDER — VILAZODONE HCL 20 MG PO TABS: 1.0000 | ORAL_TABLET | Freq: Every day | ORAL | Status: DC

## 2015-05-06 NOTE — Assessment & Plan Note (Signed)
It has now been 4-1/2 months that we have been treating Benjamin Leonard's Achilles tendinosis. He has done well with boot immobilization, topical nitroglycerin patches, orthotics, heel lift, and formal eccentric physical therapy. At this point we will have him return to work, he is 80+ percent better. Still has some tenderness over the Achilles nodule, continue heel lifts, home rehabilitation, and return to see me in 3 months.  We will consider Achilles PRP if no better at the next visit.

## 2015-05-06 NOTE — Therapy (Addendum)
[place], [place], [address] Phone: [phone]   Fax:  [phone]  Physical Therapy Treatment  Patient Details  Name: Benjamin Leonard MRN: [id number] Date of Birth: 02/01/1966 Referring Provider:  Silverio Decamp,*  Encounter Date: 05/06/2015      PT End of Session - 05/06/15 0849    Visit Number 14   Number of Visits 14   Date for PT Re-Evaluation 05/16/15   PT Start Time 0848   PT Stop Time 0929   PT Time Calculation (min) 41 min      Past Medical History  Diagnosis Date  . Ulcerative colitis   . Hearing loss     Right ear w/ hearing aid (new one in 2008)  . OAB (overactive bladder)     or BPH?  Marland Kitchen Borderline hypertension     Past Surgical History  Procedure Laterality Date  . Rhinoplasty  1997  . Right ear surgery for scar tissue removal  1996    There were no vitals filed for this visit.  Visit Diagnosis:  Pain in joint, ankle and foot, left  Stiffness of ankle joint, left  Ankle weakness      Subjective Assessment - 05/06/15 0849    Subjective Pt reports he only has pain in Lt Achilles tendon if he has been sitting for prolonged period/ or first thing in morning.  Lt calf was a little sore after last treatment, but resolved with time.    Currently in Pain? No/denies            [facility] PT Assessment - 05/06/15 0001    Assessment   Medical Diagnosis Lt achilles tendonitis   Onset Date/Surgical Date 11/21/14   Next MD Visit 05/06/15          [facility] Adult PT Treatment/Exercise - 05/06/15 0001    Exercises   Exercises Ankle   Ultrasound   Ultrasound Location Lt achilles tendon   Ultrasound Parameters static: 50%, 0.8 w/cm2, 3.3 mHz    Ultrasound Goals Pain   Manual Therapy   Manual Therapy Soft tissue mobilization   Soft tissue mobilization Edge tool assistance to Lt calf, and gentle cross fiber friction to achilles tendon to decrease tissue restrictions     Ankle Exercises: Aerobic   Stationary Bike L3: 5 min    Ankle Exercises: Stretches   Soleus Stretch 2 reps;30 seconds   Gastroc Stretch 2 reps;30 seconds   Ankle Exercises: Standing   BAPS Standing;10 reps;Level 3  12/6, 3/9,clock and CCW   SLS Lt forward leans to chair seat x 10 reps, VC to improve form.    Heel Raises 10 reps  3 sets of 10, toes straight, in, out   Other Standing Ankle Exercises Downward dog, 30 sec, x 3 reps           PT Long Term Goals - 05/04/15 1118    PT LONG TERM GOAL #1   Title I with advanced HEP ( 05/16/15)   Time 4   Period Weeks   Status Achieved   PT LONG TERM GOAL #2   Title report pain decrease in Lt ankle to allow return to plyometric exercise ( 05/16/15)   Time 4   Period Weeks   Status On-going   PT LONG TERM GOAL #3   Title increase Lt ankle dorsiflexion =/> 15 degrees to assist with gait (05/16/15)   Time 4   Period Weeks   Status Achieved  PT LONG TERM GOAL #4   Title demo Lt ankle strength WNL (05/16/15)   Time 8   Period Weeks   Status Achieved   PT LONG TERM GOAL #5   Title improve FOTO =/< 31% limited ( 05/16/15)   Baseline FOTO 32% limitation   Time 4   Period Weeks   Status Partially Met               Plan - 05/06/15 0907    Clinical Impression Statement Pt tolerated slight increase in repetitions/difficulty without increase in pain.  Pt continues with tenderness to touch in mid Achilles tendon, although reduced from previous sessions.  Pt has progressed well towards goals; holding off on plyometrics to allow tissue further time to heal therefore has not met LTG #2.     Pt will benefit from skilled therapeutic intervention in order to improve on the following deficits Increased edema;Decreased strength;Pain;Abnormal gait;Decreased range of motion;Difficulty walking   Rehab Potential Good   PT Frequency 2x / week   PT Duration 4 weeks   PT Treatment/Interventions Passive range of motion;Iontophoresis 32m/ml  Dexamethasone;Electrical Stimulation;Patient/family education;Cryotherapy;Neuromuscular re-education;Vasopneumatic Device;Manual techniques;Balance training;Ultrasound;Therapeutic exercise;Moist Heat   PT Next Visit Plan Will hold therapy and wait for advisement from MD.    Consulted and Agree with Plan of Care Patient        Problem List Patient Active Problem List   Diagnosis Date Noted  . Anxiety and depression 02/15/2015  . Left Achilles tendinitis 01/18/2015  . Right lumbar radiculitis 09/30/2014  . Hiatal hernia 09/03/2014  . Elevated blood pressure 08/18/2014  . Hemangioma of liver 08/06/2014  . Exertional chest pain 06/22/2014  . Urinary obstruction 12/08/2013  . Right distal Biceps tendon tear 12/04/2012  . Annual physical exam 03/08/2012  . Bradycardia by electrocardiogram 12/20/2010  . Preventative health care 10/19/2010  . Erectile dysfunction 10/19/2010  . Allergic rhinitis 10/19/2010  . COLITIS, ULCERATIVE NOS 05/27/2007   JKerin Perna PTA 05/06/2015 1:45 PM  [facility] [place] [address] [address]Phone: [phone]  Fax:  [phone]    PHYSICAL THERAPY DISCHARGE SUMMARY  Visits from Start of Care: 14  Current functional level related to goals / functional outcomes: Progressing well with resolution of chronic problems. Returned to normal functional activities and I in HEP   Remaining deficits: Pain when he stands after having been sitting for prolonged periods of time   Education / Equipment: HEP; theraband  Plan: Patient agrees to discharge.  Patient goals were met. Patient is being discharged due to meeting the stated rehab goals.  ?????   Celyn P. HHelene KelpPT, MPH 06/07/2015 8:45 AM

## 2015-05-06 NOTE — Progress Notes (Signed)
  Subjective:    CC: follow-up  HPI: Left Achilles tendinosis: Pain continues to improve, 80% better over the last 4 months, he has been in immobilization, heel lift, formal physical therapy, topical nitroglycerin. Custom orthotics. Eager to get back to work.  Past medical history, Surgical history, Family history not pertinant except as noted below, Social history, Allergies, and medications have been entered into the medical record, reviewed, and no changes needed.   Review of Systems: No fevers, chills, night sweats, weight loss, chest pain, or shortness of breath.   Objective:    General: Well Developed, well nourished, and in no acute distress.  Neuro: Alert and oriented x3, extra-ocular muscles intact, sensation grossly intact.  HEENT: Normocephalic, atraumatic, pupils equal round reactive to light, neck supple, no masses, no lymphadenopathy, thyroid nonpalpable.  Skin: Warm and dry, no rashes. Cardiac: Regular rate and rhythm, no murmurs rubs or gallops, no lower extremity edema.  Respiratory: Clear to auscultation bilaterally. Not using accessory muscles, speaking in full sentences. Left ankle: Still with a visible and palpable tender Achilles nodule but much better than before.  Impression and Recommendations:

## 2015-05-06 NOTE — Assessment & Plan Note (Signed)
Refilling Viibryd daily

## 2015-05-18 ENCOUNTER — Telehealth: Payer: Self-pay

## 2015-05-18 DIAGNOSIS — K449 Diaphragmatic hernia without obstruction or gangrene: Secondary | ICD-10-CM

## 2015-05-18 MED ORDER — ESOMEPRAZOLE MAGNESIUM 40 MG PO CPDR: 40.0000 mg | DELAYED_RELEASE_CAPSULE | Freq: Every day | ORAL | Status: DC

## 2015-05-18 NOTE — Telephone Encounter (Signed)
PHARMACIST WOULD LIKE TO CHANGE PATIENT RX NEXIUM  TO A 90 DAY SUPPLY.

## 2015-06-27 ENCOUNTER — Other Ambulatory Visit: Payer: Self-pay | Admitting: Sports Medicine

## 2015-06-27 DIAGNOSIS — F329 Major depressive disorder, single episode, unspecified: Secondary | ICD-10-CM

## 2015-06-27 DIAGNOSIS — F32A Depression, unspecified: Secondary | ICD-10-CM

## 2015-06-27 DIAGNOSIS — F419 Anxiety disorder, unspecified: Principal | ICD-10-CM

## 2015-06-27 MED ORDER — VILAZODONE HCL 20 MG PO TABS: 1.0000 | ORAL_TABLET | Freq: Every day | ORAL | Status: DC

## 2015-08-05 ENCOUNTER — Ambulatory Visit (INDEPENDENT_AMBULATORY_CARE_PROVIDER_SITE_OTHER): Payer: BLUE CROSS/BLUE SHIELD | Admitting: Sports Medicine

## 2015-08-05 ENCOUNTER — Encounter: Payer: Self-pay | Admitting: Sports Medicine

## 2015-08-05 ENCOUNTER — Ambulatory Visit (INDEPENDENT_AMBULATORY_CARE_PROVIDER_SITE_OTHER): Payer: BLUE CROSS/BLUE SHIELD

## 2015-08-05 VITALS — BP 138/88 | HR 61 | Temp 97.7°F | Resp 18 | Wt 192.2 lb

## 2015-08-05 DIAGNOSIS — R05 Cough: Secondary | ICD-10-CM

## 2015-08-05 DIAGNOSIS — B351 Tinea unguium: Secondary | ICD-10-CM | POA: Diagnosis not present

## 2015-08-05 DIAGNOSIS — R918 Other nonspecific abnormal finding of lung field: Secondary | ICD-10-CM | POA: Diagnosis not present

## 2015-08-05 DIAGNOSIS — R059 Cough, unspecified: Secondary | ICD-10-CM

## 2015-08-05 DIAGNOSIS — M7662 Achilles tendinitis, left leg: Secondary | ICD-10-CM

## 2015-08-05 MED ORDER — AZITHROMYCIN 250 MG PO TABS: ORAL_TABLET | ORAL | Status: DC

## 2015-08-05 MED ORDER — TERBINAFINE HCL 250 MG PO TABS: 250.0000 mg | ORAL_TABLET | Freq: Every day | ORAL | Status: AC

## 2015-08-05 NOTE — Progress Notes (Signed)
  Subjective:    CC:  coughing  HPI: This is a pleasant 50 year old male with a history of left upper lobe pneumonia earlier last year, he has a recurrence of cough with symptoms that feel somewhat similar, no shortness of breath, no constitutional symptoms.  Toenail thickening: thickened, yellow, wonders if this may be toenail fungus.  Achilles tendinitis: Resolved after nitroglycerin, heel lifts, physical therapy, orthotics , over 5 months.  Past medical history, Surgical history, Family history not pertinant except as noted below, Social history, Allergies, and medications have been entered into the medical record, reviewed, and no changes needed.   Review of Systems: No fevers, chills, night sweats, weight loss, chest pain, or shortness of breath.   Objective:    General: Well Developed, well nourished, and in no acute distress.  Neuro: Alert and oriented x3, extra-ocular muscles intact, sensation grossly intact.  HEENT: Normocephalic, atraumatic, pupils equal round reactive to light, neck supple, no masses, no lymphadenopathy, thyroid nonpalpable.  Skin: Warm and dry, no rashes. Cardiac: Regular rate and rhythm, no murmurs rubs or gallops, no lower extremity edema.  Respiratory: Coarse sounds without overt crackles in the right middle lobe. Not using accessory muscles, speaking in full sentences. Toenails: Thickened, yellow, consistent with onychomycosis  Impression and Recommendations:

## 2015-08-05 NOTE — Assessment & Plan Note (Signed)
Chest x-ray, he does a history of pneumonia. When he feels better we will do the  Pneumococcal 23.

## 2015-08-05 NOTE — Assessment & Plan Note (Signed)
Lamisil for 3-6 months. 

## 2015-08-05 NOTE — Assessment & Plan Note (Signed)
Completely resolved after nitroglycerin, heel lift, physical therapy.

## 2015-08-08 ENCOUNTER — Ambulatory Visit: Payer: BLUE CROSS/BLUE SHIELD | Admitting: Sports Medicine

## 2015-08-22 ENCOUNTER — Ambulatory Visit (INDEPENDENT_AMBULATORY_CARE_PROVIDER_SITE_OTHER): Payer: BLUE CROSS/BLUE SHIELD | Admitting: Sports Medicine

## 2015-08-22 ENCOUNTER — Ambulatory Visit (INDEPENDENT_AMBULATORY_CARE_PROVIDER_SITE_OTHER): Payer: BLUE CROSS/BLUE SHIELD

## 2015-08-22 DIAGNOSIS — R059 Cough, unspecified: Secondary | ICD-10-CM

## 2015-08-22 DIAGNOSIS — R51 Headache: Secondary | ICD-10-CM

## 2015-08-22 DIAGNOSIS — R05 Cough: Secondary | ICD-10-CM

## 2015-08-22 MED ORDER — LEVOFLOXACIN 750 MG PO TABS: 750.0000 mg | ORAL_TABLET | Freq: Every day | ORAL | Status: DC

## 2015-08-22 MED ORDER — CEFTRIAXONE SODIUM 1 G IJ SOLR
1.0000 g | Freq: Once | INTRAMUSCULAR | Status: AC
  Administered 2015-08-22: 1 g via INTRAMUSCULAR

## 2015-08-22 NOTE — Assessment & Plan Note (Signed)
Initially resolved with a course of azithromycin however having recurrence of symptoms that feel like prior to episodes of pneumonia. Chest x-ray, levofloxacin, ceftriaxone 1000 mg IM here today. Return to see me in one month.

## 2015-08-22 NOTE — Progress Notes (Signed)
  Subjective:    CC: Follow-up  HPI: This is a pleasant 50 year old male, we recently treated him for pneumonia last month, he had a fantastic improvement after 5 days of azithromycin but recently for the past week has had a decline in symptoms with worsening cough, less chest pain. He has had malaise, fevers, chills. Minimal shortness of breath.  Past medical history, Surgical history, Family history not pertinant except as noted below, Social history, Allergies, and medications have been entered into the medical record, reviewed, and no changes needed.   Review of Systems: No fevers, chills, night sweats, weight loss, chest pain, or shortness of breath.   Objective:    General: Well Developed, well nourished, and in no acute distress.  Neuro: Alert and oriented x3, extra-ocular muscles intact, sensation grossly intact.  HEENT: Normocephalic, atraumatic, pupils equal round reactive to light, neck supple, no masses, no lymphadenopathy, thyroid nonpalpable.  Skin: Warm and dry, no rashes. Cardiac: Regular rate and rhythm, no murmurs rubs or gallops, no lower extremity edema.  Respiratory: Clear to auscultation bilaterally. Not using accessory muscles, speaking in full sentences.  Impression and Recommendations:    I spent 25 minutes with this patient, greater than 50% was face-to-face time counseling regarding the above diagnoses

## 2015-08-29 ENCOUNTER — Ambulatory Visit (INDEPENDENT_AMBULATORY_CARE_PROVIDER_SITE_OTHER): Payer: BLUE CROSS/BLUE SHIELD | Admitting: Sports Medicine

## 2015-08-29 VITALS — BP 130/87 | HR 101 | Resp 18 | Wt 188.0 lb

## 2015-08-29 DIAGNOSIS — S86812A Strain of other muscle(s) and tendon(s) at lower leg level, left leg, initial encounter: Secondary | ICD-10-CM | POA: Diagnosis not present

## 2015-08-29 DIAGNOSIS — S86819A Strain of other muscle(s) and tendon(s) at lower leg level, unspecified leg, initial encounter: Secondary | ICD-10-CM | POA: Insufficient documentation

## 2015-08-29 NOTE — Progress Notes (Signed)
  Subjective:    CC: "I think I tore my calf"  HPI: Patient presents with left calf pain that began yesterday. He began to run up the stairs and felt a pop with immediate pain. There was swelling that has come down with no bruising. He has been able to walk with only mild pain. He denies any knee or ankle pain. He denies any fevers, weakness, numbness, or tingling. He has had a history of MSK injuries and endorses having anxiety from wondering why this happens to him and having to call out of work again. He says that he did not sleep last night because of this anxiety and he feels that these repeated injuries are too much. He denies SI.  Past medical history, Surgical history, Family history not pertinant except as noted below, Social history, Allergies, and medications have been entered into the medical record, reviewed, and no changes needed.   Review of Systems: No fevers, chills, night sweats, weight loss, chest pain, or shortness of breath.   Objective:    General: Well Developed, well nourished, and in no acute distress.  Neuro: Alert and oriented x3, extra-ocular muscles intact, sensation grossly intact.  HEENT: Normocephalic, atraumatic, pupils equal round reactive to light, neck supple, no masses, no lymphadenopathy, thyroid nonpalpable.  Skin: Warm and dry, no rashes. Cardiac: Regular rate and rhythm, no murmurs rubs or gallops, no lower extremity edema.  Respiratory: Clear to auscultation bilaterally. Not using accessory muscles, speaking in full sentences. Left Knee: Normal to inspection with no erythema or effusion or obvious bony abnormalities. Palpation normal with no warmth, joint line tenderness, patellar tenderness, or condyle tenderness. ROM full in flexion and extension and lower leg rotation. Ligaments with solid consistent endpoints including ACL, PCL, LCL, MCL. Negative Mcmurray's, Apley's, and Thessalonian tests. Non painful patellar compression. Patellar glide  without crepitus. Patellar and quadriceps tendons unremarkable. Hamstring and quadriceps strength is normal. Pain with resisted plantar flexion Severe tenderness along the medial gastrocnemius with no palpable defect. No swelling or bruising of the lower leg. Left Foot: No visible erythema or swelling. Range of motion is full in all directions. Strength is 5/5 in all directions. No hallux valgus. No pes cavus or pes planus. No abnormal callus noted. No pain over the navicular prominence, or base of fifth metatarsal. No tenderness to palpation of the calcaneal insertion of plantar fascia. No pain at the Achilles insertion. No pain over the calcaneal bursa. No pain of the retrocalcaneal bursa. No tenderness to palpation over the tarsals, metatarsals, or phalanges. No hallux rigidus or limitus. No tenderness palpation over interphalangeal joints. No pain with compression of the metatarsal heads. Neurovascularly intact distally.  Impression and Recommendations:    Patient's presentation consistent with a grade 1 muscle strain. 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 He does not want to change his anxiolytic medication at this time, instead opting to . Gave patient an immobilizing boot and instructed him to wear it. Also gave heel lifts to put inside the boot.  He was given a 2wk work note.

## 2015-08-29 NOTE — Assessment & Plan Note (Addendum)
Medial head of the gastrocnemius, diffuse and likely grade 1 with a negative Thompson's test. Strap with compressive dressing, relative rest, he has a boot at home that he will wear. Out of work for 2 weeks. He was exquisitely tender out of portion with degree of palpation so we still need to consider myofascial pain syndrome if insufficient improvement Also adding a heel lift.

## 2015-09-12 ENCOUNTER — Ambulatory Visit (INDEPENDENT_AMBULATORY_CARE_PROVIDER_SITE_OTHER): Payer: BLUE CROSS/BLUE SHIELD | Admitting: Sports Medicine

## 2015-09-12 ENCOUNTER — Encounter: Payer: Self-pay | Admitting: Sports Medicine

## 2015-09-12 VITALS — BP 122/77 | HR 77 | Wt 191.0 lb

## 2015-09-12 DIAGNOSIS — S86812D Strain of other muscle(s) and tendon(s) at lower leg level, left leg, subsequent encounter: Secondary | ICD-10-CM | POA: Diagnosis not present

## 2015-09-12 NOTE — Assessment & Plan Note (Signed)
Clinically resolved, disability paperwork completed today, continue heel lifts for now.

## 2015-09-12 NOTE — Progress Notes (Signed)
  Subjective:    CC: Follow-up  HPI: Left calf strain: Resolved.  Past medical history, Surgical history, Family history not pertinant except as noted below, Social history, Allergies, and medications have been entered into the medical record, reviewed, and no changes needed.   Review of Systems: No fevers, chills, night sweats, weight loss, chest pain, or shortness of breath.   Objective:    General: Well Developed, well nourished, and in no acute distress.  Neuro: Alert and oriented x3, extra-ocular muscles intact, sensation grossly intact.  HEENT: Normocephalic, atraumatic, pupils equal round reactive to light, neck supple, no masses, no lymphadenopathy, thyroid nonpalpable.  Skin: Warm and dry, no rashes. Cardiac: Regular rate and rhythm, no murmurs rubs or gallops, no lower extremity edema.  Respiratory: Clear to auscultation bilaterally. Not using accessory muscles, speaking in full sentences. Left ankle: No visible erythema or swelling. Range of motion is full in all directions. Strength is 5/5 in all directions. Stable lateral and medial ligaments; squeeze test and kleiger test unremarkable; Talar dome nontender; No pain at base of 5th MT; No tenderness over cuboid; No tenderness over N spot or navicular prominence No tenderness on posterior aspects of lateral and medial malleolus No sign of peroneal tendon subluxations; Negative tarsal tunnel tinel's Able to walk 4 steps.  Impression and Recommendations:

## 2015-10-03 ENCOUNTER — Other Ambulatory Visit: Payer: Self-pay

## 2015-10-03 DIAGNOSIS — F329 Major depressive disorder, single episode, unspecified: Secondary | ICD-10-CM

## 2015-10-03 DIAGNOSIS — F32A Depression, unspecified: Secondary | ICD-10-CM

## 2015-10-03 DIAGNOSIS — F419 Anxiety disorder, unspecified: Principal | ICD-10-CM

## 2015-10-03 MED ORDER — VILAZODONE HCL 20 MG PO TABS: 1.0000 | ORAL_TABLET | Freq: Every day | ORAL | Status: DC

## 2015-12-30 ENCOUNTER — Encounter: Payer: Self-pay | Admitting: Sports Medicine

## 2015-12-30 ENCOUNTER — Ambulatory Visit (INDEPENDENT_AMBULATORY_CARE_PROVIDER_SITE_OTHER): Payer: BLUE CROSS/BLUE SHIELD | Admitting: Sports Medicine

## 2015-12-30 VITALS — BP 123/79 | HR 81 | Resp 18 | Wt 188.8 lb

## 2015-12-30 DIAGNOSIS — F329 Major depressive disorder, single episode, unspecified: Secondary | ICD-10-CM

## 2015-12-30 DIAGNOSIS — Z0000 Encounter for general adult medical examination without abnormal findings: Secondary | ICD-10-CM | POA: Diagnosis not present

## 2015-12-30 DIAGNOSIS — F419 Anxiety disorder, unspecified: Principal | ICD-10-CM

## 2015-12-30 DIAGNOSIS — IMO0001 Reserved for inherently not codable concepts without codable children: Secondary | ICD-10-CM

## 2015-12-30 DIAGNOSIS — F32A Depression, unspecified: Secondary | ICD-10-CM

## 2015-12-30 DIAGNOSIS — F418 Other specified anxiety disorders: Secondary | ICD-10-CM | POA: Diagnosis not present

## 2015-12-30 DIAGNOSIS — R030 Elevated blood-pressure reading, without diagnosis of hypertension: Secondary | ICD-10-CM | POA: Diagnosis not present

## 2015-12-30 MED ORDER — VILAZODONE HCL 20 MG PO TABS: 1.0000 | ORAL_TABLET | Freq: Every day | ORAL | Status: DC

## 2015-12-30 NOTE — Assessment & Plan Note (Signed)
Patient will return and schedule his physical, routine blood work ordered

## 2015-12-30 NOTE — Assessment & Plan Note (Signed)
Refilling medication

## 2015-12-30 NOTE — Progress Notes (Signed)
  Subjective:    CC:  Follow-up  HPI: Anxiety and depression: Overall was well controlled but ran out of Viibryd, is only here to get a refill. No complaints, no suicidal or homicidal ideation.  Past medical history, Surgical history, Family history not pertinant except as noted below, Social history, Allergies, and medications have been entered into the medical record, reviewed, and no changes needed.   Review of Systems: No fevers, chills, night sweats, weight loss, chest pain, or shortness of breath.   Objective:    General: Well Developed, well nourished, and in no acute distress.  Neuro: Alert and oriented x3, extra-ocular muscles intact, sensation grossly intact.  HEENT: Normocephalic, atraumatic, pupils equal round reactive to light, neck supple, no masses, no lymphadenopathy, thyroid nonpalpable.  Skin: Warm and dry, no rashes. Cardiac: Regular rate and rhythm, no murmurs rubs or gallops, no lower extremity edema.  Respiratory: Clear to auscultation bilaterally. Not using accessory muscles, speaking in full sentences.  Impression and Recommendations:

## 2016-01-06 LAB — CBC
HCT: 43.4 % (ref 38.5–50.0)
Hemoglobin: 14.8 g/dL (ref 13.2–17.1)
MCH: 30.5 pg (ref 27.0–33.0)
MCHC: 34.1 g/dL (ref 32.0–36.0)
MCV: 89.5 fL (ref 80.0–100.0)
MPV: 9.4 fL (ref 7.5–12.5)
Platelets: 217 K/uL (ref 140–400)
RBC: 4.85 MIL/uL (ref 4.20–5.80)
RDW: 13.3 % (ref 11.0–15.0)
WBC: 4.2 K/uL (ref 3.8–10.8)

## 2016-01-06 LAB — HEMOGLOBIN A1C
Hgb A1c MFr Bld: 5.6 % (ref <5.7)
Mean Plasma Glucose: 114 mg/dL

## 2016-01-07 LAB — COMPREHENSIVE METABOLIC PANEL
AST: 18 U/L (ref 10–35)
Albumin: 4.4 g/dL (ref 3.6–5.1)
CO2: 30 mmol/L (ref 20–31)
Creat: 1.09 mg/dL (ref 0.70–1.33)
Sodium: 141 mmol/L (ref 135–146)
Total Bilirubin: 0.5 mg/dL (ref 0.2–1.2)
Total Protein: 6.7 g/dL (ref 6.1–8.1)

## 2016-01-07 LAB — VITAMIN D 25 HYDROXY (VIT D DEFICIENCY, FRACTURES): Vit D, 25-Hydroxy: 32 ng/mL (ref 30–100)

## 2016-01-07 LAB — COMPREHENSIVE METABOLIC PANEL WITH GFR
ALT: 17 U/L (ref 9–46)
Alkaline Phosphatase: 70 U/L (ref 40–115)
BUN: 16 mg/dL (ref 7–25)
Calcium: 9.2 mg/dL (ref 8.6–10.3)
Chloride: 104 mmol/L (ref 98–110)
Glucose, Bld: 93 mg/dL (ref 65–99)
Potassium: 4.6 mmol/L (ref 3.5–5.3)

## 2016-01-07 LAB — LIPID PANEL
Cholesterol: 218 mg/dL — ABNORMAL HIGH (ref 125–200)
HDL: 51 mg/dL (ref >=40)
LDL Cholesterol: 139 mg/dL — ABNORMAL HIGH (ref <130)
Total CHOL/HDL Ratio: 4.3 Ratio (ref <=5.0)
Triglycerides: 142 mg/dL (ref <150)
VLDL: 28 mg/dL (ref <30)

## 2016-01-07 LAB — TSH: TSH: 2.09 mIU/L (ref 0.40–4.50)

## 2016-01-10 ENCOUNTER — Ambulatory Visit (INDEPENDENT_AMBULATORY_CARE_PROVIDER_SITE_OTHER): Payer: BLUE CROSS/BLUE SHIELD | Admitting: Sports Medicine

## 2016-01-10 ENCOUNTER — Encounter: Payer: Self-pay | Admitting: Sports Medicine

## 2016-01-10 VITALS — BP 121/75 | HR 97 | Resp 18 | Ht 72.0 in | Wt 191.3 lb

## 2016-01-10 DIAGNOSIS — M5030 Other cervical disc degeneration, unspecified cervical region: Secondary | ICD-10-CM | POA: Diagnosis not present

## 2016-01-10 DIAGNOSIS — E785 Hyperlipidemia, unspecified: Secondary | ICD-10-CM

## 2016-01-10 DIAGNOSIS — Z0000 Encounter for general adult medical examination without abnormal findings: Secondary | ICD-10-CM

## 2016-01-10 NOTE — Assessment & Plan Note (Signed)
Complete physical as above, went over blood work.

## 2016-01-10 NOTE — Progress Notes (Signed)
  Subjective:    CC: Complete physical  HPI:  This is a pleasant 50 year old male here for a physical, he did have mildly elevated lipids, but desires to try aggressive dietary modification first. No other complaints.  Past medical history, Surgical history, Family history not pertinant except as noted below, Social history, Allergies, and medications have been entered into the medical record, reviewed, and no changes needed.   Review of Systems: No headache, visual changes, nausea, vomiting, diarrhea, constipation, dizziness, abdominal pain, skin rash, fevers, chills, night sweats, swollen lymph nodes, weight loss, chest pain, body aches, joint swelling, muscle aches, shortness of breath, mood changes, visual or auditory hallucinations.  Objective:    General: Well Developed, well nourished, and in no acute distress.  Neuro: Alert and oriented x3, extra-ocular muscles intact, sensation grossly intact. Cranial nerves II through XII are intact, motor, sensory, and coordinative functions are all intact. HEENT: Normocephalic, atraumatic, pupils equal round reactive to light, neck supple, no masses, no lymphadenopathy, thyroid nonpalpable. Oropharynx, nasopharynx, external ear canals are unremarkable. Skin: Warm and dry, no rashes noted.  Cardiac: Regular rate and rhythm, no murmurs rubs or gallops.  Respiratory: Clear to auscultation bilaterally. Not using accessory muscles, speaking in full sentences.  Abdominal: Soft, nontender, nondistended, positive bowel sounds, no masses, no organomegaly.  Musculoskeletal: Shoulder, elbow, wrist, hip, knee, ankle stable, and with full range of motion.  Impression and Recommendations:    The patient was counselled, risk factors were discussed, anticipatory guidance given.

## 2016-01-10 NOTE — Assessment & Plan Note (Signed)
Will do some of his wife's fish oil supplements, and we will recheck in 3 months, he will also do dietary modification.

## 2016-01-10 NOTE — Assessment & Plan Note (Signed)
With occasional right-sided radiculopathy. Continues with preferred pain management, adding neck rehabilitation exercises, return as needed for this.

## 2016-02-29 ENCOUNTER — Ambulatory Visit (INDEPENDENT_AMBULATORY_CARE_PROVIDER_SITE_OTHER): Payer: BLUE CROSS/BLUE SHIELD

## 2016-02-29 ENCOUNTER — Ambulatory Visit (INDEPENDENT_AMBULATORY_CARE_PROVIDER_SITE_OTHER): Payer: BLUE CROSS/BLUE SHIELD | Admitting: Sports Medicine

## 2016-02-29 ENCOUNTER — Encounter: Payer: Self-pay | Admitting: Sports Medicine

## 2016-02-29 DIAGNOSIS — R059 Cough, unspecified: Secondary | ICD-10-CM

## 2016-02-29 DIAGNOSIS — R05 Cough: Secondary | ICD-10-CM

## 2016-02-29 MED ORDER — AZITHROMYCIN 250 MG PO TABS: ORAL_TABLET | ORAL | 0 refills | Status: DC

## 2016-02-29 MED ORDER — MELOXICAM 15 MG PO TABS: ORAL_TABLET | ORAL | 3 refills | Status: DC

## 2016-02-29 NOTE — Progress Notes (Signed)
  Subjective:    CC:   HPI:  Past medical history, Surgical history, Family history not pertinant except as noted below, Social history, Allergies, and medications have been entered into the medical record, reviewed, and no changes needed.   Review of Systems: No fevers, chills, night sweats, weight loss, chest pain, or shortness of breath.   Objective:    General: Well Developed, well nourished, and in no acute distress.  Neuro: Alert and oriented x3, extra-ocular muscles intact, sensation grossly intact.  HEENT: Normocephalic, atraumatic, pupils equal round reactive to light, neck supple, no masses, no lymphadenopathy, thyroid nonpalpable.  Skin: Warm and dry, no rashes. Cardiac: Regular rate and rhythm, no murmurs rubs or gallops, no lower extremity edema.  Respiratory: Clear to auscultation bilaterally. Not using accessory muscles, speaking in full sentences.   Impression and Recommendations:    Cough 2-3 weeks of productive cough, myalgias, constitutional symptoms. History of pneumonia, chest x-rays negative but considering duration of symptoms we are going to add azithromycin and meloxicam.    Subjective:    CC: Cough  HPI: For 3 weeks this pleasant 49 year old male with a history of left lingular pneumonia comes in with a productive cough, myalgias, fatigue. Symptoms are moderate, persistent, no GI symptoms, rash area no fevers.  Past medical history, Surgical history, Family history not pertinant except as noted below, Social history, Allergies, and medications have been entered into the medical record, reviewed, and no changes needed.   Review of Systems: No fevers, chills, night sweats, weight loss, chest pain, or shortness of breath.   Objective:    General: Well Developed, well nourished, and in no acute distress.  Neuro: Alert and oriented x3, extra-ocular muscles intact, sensation grossly intact.  HEENT: Normocephalic, atraumatic, pupils equal round reactive  to light, neck supple, no masses, no lymphadenopathy, thyroid nonpalpable.  Skin: Warm and dry, no rashes. Cardiac: Regular rate and rhythm, no murmurs rubs or gallops, no lower extremity edema.  Respiratory: Clear to auscultation bilaterally. Not using accessory muscles, speaking in full sentences.  Chest X are reviewed and is negative  Impression and Recommendations:    Cough 2-3 weeks of productive cough, myalgias, constitutional symptoms. History of pneumonia, chest x-rays negative but considering duration of symptoms we are going to add azithromycin and meloxicam.  I spent 25 minutes with this patient, greater than 50% was face-to-face time counseling regarding the above diagnoses

## 2016-02-29 NOTE — Assessment & Plan Note (Signed)
2-3 weeks of productive cough, myalgias, constitutional symptoms. History of pneumonia, chest x-rays negative but considering duration of symptoms we are going to add azithromycin and meloxicam.

## 2016-04-12 ENCOUNTER — Ambulatory Visit (INDEPENDENT_AMBULATORY_CARE_PROVIDER_SITE_OTHER): Payer: BLUE CROSS/BLUE SHIELD | Admitting: Sports Medicine

## 2016-04-12 VITALS — BP 101/63 | HR 81 | Resp 18 | Wt 189.0 lb

## 2016-04-12 DIAGNOSIS — Z23 Encounter for immunization: Secondary | ICD-10-CM

## 2016-04-12 DIAGNOSIS — M222X1 Patellofemoral disorders, right knee: Secondary | ICD-10-CM | POA: Diagnosis not present

## 2016-04-12 NOTE — Assessment & Plan Note (Signed)
Classic with week hip abductor's on the right. Formal physical therapy, return to see me in one month.

## 2016-04-12 NOTE — Progress Notes (Signed)
  Subjective:    CC: Sore right knee  HPI: Patient is a 50 y.o. Caucasian male presenting today with complaints of right knee soreness. The patient states that he began noticing symptoms a couple of months ago but it has gradually worsened over the past 2 months. The patient denies any injury to his right knee, ankle,hip, or back but admits to having a calf muscle rupture. The patient states that the pain is the worst in the morning and with exercise including squats and knee flexion. The patient locates the pain as the top medial aspect of his knee. The patient states that the knee is tender to touch. He states that his current pain is a 4/10 with 10 being the worst pain he has ever felt. Patient denies radiation, otc treatment, or history of gout.   Past medical history, Surgical history, Family history not pertinant except as noted below, Social history, Allergies, and medications have been entered into the medical record, reviewed, and no changes needed.   Review of Systems: No fevers, chills, night sweats, weight loss, chest pain, or shortness of breath.   Objective:    General: Well Developed, well nourished, and in no acute distress.  Neuro: Alert and oriented x3, extra-ocular muscles intact, sensation grossly intact.  HEENT: Normocephalic, atraumatic, pupils equal round reactive to light, neck supple, no masses, no lymphadenopathy, thyroid nonpalpable.  Skin: Warm and dry, no rashes. Cardiac: Regular rate and rhythm, no murmurs rubs or gallops, no lower extremity edema.  Respiratory: Clear to auscultation bilaterally. Not using accessory muscles, speaking in full sentences. Musculoskeletal: Tenderness to palpation of right medial aspect of patella. Right hip abductor muscle strength noted at 3/5. Left hip abductor muscle strength noted at 5/5.   Impression and Recommendations:   Benjamin Leonard was seen today for knee pain.  Diagnoses and all orders for this visit:  Right patellofemoral  syndrome -     Ambulatory referral to Physical Therapy   1.  Right patellofemoral syndrome - Discussed with patient that his symptoms are likely secondary to patellofemoral syndrome. Patient referred to physical therapy for hip abductor strengthening and rehab. Patient to take over-the-count NSAIDs as needed for pain. Patient to return-to-clinic if symptoms persist or worsen.

## 2016-04-12 NOTE — Addendum Note (Signed)
Addended by: Elizabeth Sauer on: 04/12/2016 11:40 AM   Modules accepted: Orders

## 2016-04-23 ENCOUNTER — Other Ambulatory Visit: Payer: Self-pay | Admitting: Pain Medicine

## 2016-04-23 DIAGNOSIS — M542 Cervicalgia: Secondary | ICD-10-CM

## 2016-04-25 ENCOUNTER — Other Ambulatory Visit: Payer: Self-pay | Admitting: Sports Medicine

## 2016-04-25 ENCOUNTER — Ambulatory Visit (INDEPENDENT_AMBULATORY_CARE_PROVIDER_SITE_OTHER): Payer: BLUE CROSS/BLUE SHIELD | Admitting: Physical Therapy

## 2016-04-25 ENCOUNTER — Encounter: Payer: Self-pay | Admitting: Physical Therapy

## 2016-04-25 DIAGNOSIS — M25561 Pain in right knee: Secondary | ICD-10-CM

## 2016-04-25 DIAGNOSIS — M6281 Muscle weakness (generalized): Secondary | ICD-10-CM | POA: Diagnosis not present

## 2016-04-25 DIAGNOSIS — E785 Hyperlipidemia, unspecified: Secondary | ICD-10-CM

## 2016-04-25 LAB — LIPID PANEL
Cholesterol: 230 mg/dL — ABNORMAL HIGH (ref 125–200)
HDL: 50 mg/dL (ref >=40)
LDL Cholesterol: 154 mg/dL — ABNORMAL HIGH (ref <130)
Total CHOL/HDL Ratio: 4.6 ratio (ref <=5.0)
Triglycerides: 128 mg/dL (ref <150)
VLDL: 26 mg/dL (ref <30)

## 2016-04-25 NOTE — Patient Instructions (Addendum)
Hip Abduction: Modified  [facility] [phone]    Lying on left side, hip slightly forward, right leg slightly behind,  raise top leg from pillow. Repeat __to fatigue__ times per set. Do ____ sets per session. Do __1-2__ sessions per day.   Strengthening: Straight Leg Raise (Phase 3)    Resting on hands, tighten muscles on front of left thigh, turnout ~ 45 degrees, then lift leg __6-8__ inches from surface, keeping knee locked. Bring foot out to the side, back in and lower down. Repeat __to fatigue__ times per set. Do _1___ sets per session. Do __1-2__ sessions per day.  Strengthening: Hip Abductor - Resisted    With band looped around both legs above knees, push thighs apart. Perform blue band for both knees, green band for single knee.  Repeat  To fatigue ____ times per set. Do _1___ sets per session. Do __1-2__ sessions per day.  Supine: Leg Stretch with Strap (Super Advanced)    Lie on back with one leg straight. Hook strap around other foot. Straighten knee. Raise leg to maximal stretch and straighten knee further by tightening quadriceps. Slowly press other leg down as close to floor as possible. Keep lower abdominals tight. Hold _30-45__ seconds. Warning: Intense stretch. Stay within tolerance. Repeat _1__ times per session. Do __1_ sessions per day.  Copyright  VHI. All rights reserved.

## 2016-04-25 NOTE — Therapy (Signed)
[facility] [place] [facility], [place], [address] Phone: [phone]   Fax:  [phone]  Physical Therapy Evaluation  Patient Details  Name: Benjamin Leonard MRN: [id number] Date of Birth: Aug 23, 1965 Referring Provider: Dr Dianah Field  Encounter Date: 04/25/2016      PT End of Session - 04/25/16 0847    Visit Number 1   Number of Visits 4   Date for PT Re-Evaluation 05/23/16   PT Start Time 0848   PT Stop Time 0930   PT Time Calculation (min) 42 min      Past Medical History:  Diagnosis Date  . Borderline hypertension   . Hearing loss    Right ear w/ hearing aid (new one in 2008)  . OAB (overactive bladder)    or BPH?  Marland Kitchen Ulcerative colitis     Past Surgical History:  Procedure Laterality Date  . RHINOPLASTY  1997  . right ear surgery for scar tissue removal  1996    There were no vitals filed for this visit.       Subjective Assessment - 04/25/16 0847    Subjective Pt reports he noticed some Rt knee pain about a couple months ago, worst first thing in the AM, deep knee pain.  Not sure about it because he can still exercise, walk dog etc.  Will occassionally have pain with exercise.  Now has pain with stairs so he went to the doctor.  Will do some planks and ball work for his core.    Pertinent History h/o bilat gastroc/achilles tears , low back and cervical issues - getting this assessed. started with perferred pain management last week.    Diagnostic tests none for this   Patient Stated Goals get rid of knee pain, no pain with stairs   Currently in Pain? Yes   Pain Score 1    Pain Location Knee   Pain Orientation Right   Pain Descriptors / Indicators Dull   Pain Type Acute pain   Aggravating Factors  stairs   Pain Relieving Factors gentle movement            [facility] PT Assessment - 04/25/16 0001      Assessment   Medical Diagnosis Rt patellofemoral syndrome   Referring Provider Dr  Dianah Field   Onset Date/Surgical Date 02/23/16   Next MD Visit not scheduled   Prior Therapy not for this     Precautions   Precautions None     Balance Screen   Has the patient fallen in the past 6 months No   Has the patient had a decrease in activity level because of a fear of falling?  No   Is the patient reluctant to leave their home because of a fear of falling?  No     Home [employer] residence   Living Arrangements Spouse/significant other   Home Layout Two level  pain with steps     Prior Function   Level of Independence Independent   Vocation Full time employment   Vocation Requirements [employer]   Leisure exercise      Observation/Other Assessments   Focus on Therapeutic Outcomes (FOTO)  32% limited      Functional Tests   Functional tests Squat;Lunges;Single leg stance     Squat   Comments WNL     Lunges   Comments WNL, pain with Rt LE forward     Single Leg Stance   Comments bilat WNL  Posture/Postural Control   Posture Comments bilat LE's WNL     ROM / Strength   AROM / PROM / Strength AROM;Strength     AROM   AROM Assessment Site Knee;Ankle   Right/Left Knee --  bilat WNL, 0-145   Right/Left Ankle --  dorsiflexion Lt 12, Rt 10     Strength   Strength Assessment Site Hip;Knee;Ankle   Right/Left Hip Right  Lt WNl   Right Hip Flexion 5/5   Right Hip Extension 5/5   Right Hip ABduction 4-/5  TFL 4+/5   Right/Left Knee --  bilat WNL   Right/Left Ankle --  bilat WNL     Flexibility   Soft Tissue Assessment /Muscle Length yes   Hamstrings Rt 81, Lt 78   Quadriceps prone knee flexion bilat to buttocks      Palpation   Patella mobility Rt some lateral tracking                    [facility] Adult PT Treatment/Exercise - 04/25/16 0001      Exercises   Exercises Knee/Hip     Knee/Hip Exercises: Stretches   Passive Hamstring Stretch Both;1 rep;30 seconds     Knee/Hip Exercises: Supine   Straight  Leg Raise with External Rotation Right;Strengthening  to fatigue   Other Supine Knee/Hip Exercises clams with green band to fatigue     Knee/Hip Exercises: Sidelying   Hip ABduction Strengthening;Left  to fatigue                 PT Education - 04/25/16 0924    Education provided Yes   Education Details HEP    Person(s) Educated Patient   Methods Explanation;Demonstration;Handout   Comprehension Returned demonstration;Verbalized understanding             PT Long Term Goals - 04/25/16 0932      PT LONG TERM GOAL #1   Title I with advanced HEP ( 05/23/16)    Time 4   Period Weeks   Status New     PT LONG TERM GOAL #2   Title report no pain in Rt knee with stairs ( 05/23/16)    Time 4   Period Weeks   Status New     PT LONG TERM GOAL #3   Title demo Rt glut med strength =/> 5-/5 ( 05/23/16)    Time 4   Period Weeks   Status New     PT LONG TERM GOAL #4   Title improve FOTO =/> 25% limited ( 05/23/16)    Time 4   Period Weeks   Status New               Plan - 04/25/16 0930    Clinical Impression Statement 50 yo male presents with  2 month h/o Rt knee pain with functional activities and upon waking.  He has some hip weakness, lateral tracking of the patella and hamstring tightness.    Rehab Potential Excellent   PT Frequency 1x / week   PT Duration 4 weeks   PT Treatment/Interventions Moist Heat;Therapeutic exercise;Dry needling;Taping;Manual techniques;Neuromuscular re-education;Cryotherapy;Electrical Stimulation;Patient/family education   PT Next Visit Plan progress HEP, eccentric quad and higher level glut med strengthening   Consulted and Agree with Plan of Care Patient      Patient will benefit from skilled therapeutic intervention in order to improve the following deficits and impairments:  Decreased strength, Pain, Impaired flexibility  Visit Diagnosis: Pain in right knee -  Plan: PT plan of care cert/re-cert  Muscle weakness  (generalized) - Plan: PT plan of care cert/re-cert     Problem List Patient Active Problem List   Diagnosis Date Noted  . Right patellofemoral syndrome 04/12/2016  . Hyperlipidemia 01/10/2016  . Degenerative disc disease, cervical 01/10/2016  . Cough 08/05/2015  . Anxiety and depression 02/15/2015  . Left Achilles tendinitis 01/18/2015  . Right lumbar radiculitis 09/30/2014  . Hiatal hernia 09/03/2014  . Hemangioma of liver 08/06/2014  . Exertional chest pain 06/22/2014  . Urinary obstruction 12/08/2013  . Right distal Biceps tendon tear 12/04/2012  . Annual physical exam 03/08/2012  . Erectile dysfunction 10/19/2010  . Allergic rhinitis 10/19/2010  . COLITIS, ULCERATIVE NOS 05/27/2007    Jeral Pinch PT  04/25/2016, 10:05 AM  [facility] [place], [place], [address] Phone: [phone]   Fax:  [phone]  Name: NEER HANSON MRN: [id number] Date of Birth: 02/03/1966

## 2016-05-04 ENCOUNTER — Other Ambulatory Visit: Payer: Self-pay | Admitting: Pain Medicine

## 2016-05-04 ENCOUNTER — Ambulatory Visit (INDEPENDENT_AMBULATORY_CARE_PROVIDER_SITE_OTHER): Payer: BLUE CROSS/BLUE SHIELD

## 2016-05-04 DIAGNOSIS — M5136 Other intervertebral disc degeneration, lumbar region: Secondary | ICD-10-CM

## 2016-05-04 DIAGNOSIS — M545 Low back pain: Secondary | ICD-10-CM

## 2016-05-04 DIAGNOSIS — M50323 Other cervical disc degeneration at C6-C7 level: Secondary | ICD-10-CM | POA: Diagnosis not present

## 2016-05-04 DIAGNOSIS — M542 Cervicalgia: Secondary | ICD-10-CM

## 2016-05-09 ENCOUNTER — Ambulatory Visit (INDEPENDENT_AMBULATORY_CARE_PROVIDER_SITE_OTHER): Payer: BLUE CROSS/BLUE SHIELD | Admitting: Physical Therapy

## 2016-05-09 DIAGNOSIS — M6281 Muscle weakness (generalized): Secondary | ICD-10-CM

## 2016-05-09 DIAGNOSIS — M25561 Pain in right knee: Secondary | ICD-10-CM | POA: Diagnosis not present

## 2016-05-09 NOTE — Therapy (Signed)
[facility] [place], [place], [address] Phone: [phone]   Fax:  [phone]  Physical Therapy Treatment  Patient Details  Name: Benjamin Leonard MRN: [id number] Date of Birth: 1966-03-19 Referring Provider: Dr Darene Lamer  Encounter Date: 05/09/2016      PT End of Session - 05/09/16 0856    Visit Number 2   Number of Visits 4   Date for PT Re-Evaluation 05/23/16   PT Start Time 0853   PT Stop Time 0919   PT Time Calculation (min) 26 min   Activity Tolerance Patient tolerated treatment well      Past Medical History:  Diagnosis Date  . Borderline hypertension   . Hearing loss    Right ear w/ hearing aid (new one in 2008)  . OAB (overactive bladder)    or BPH?  Marland Kitchen Ulcerative colitis     Past Surgical History:  Procedure Laterality Date  . RHINOPLASTY  1997  . right ear surgery for scar tissue removal  1996    There were no vitals filed for this visit.      Subjective Assessment - 05/09/16 0854    Subjective Benjamin Leonard reports he is doing better, not feeling good health wise today, knee is better 50% .  still a little bit of pain with stairs and some in the AM   Patient Stated Goals get rid of knee pain, no pain with stairs   Currently in Pain? No/denies            [facility] PT Assessment - 05/09/16 0001      Assessment   Medical Diagnosis Rt patellofemoral syndrome   Referring Provider Dr T     Strength   Right Hip ABduction --  5-/5                     [facility] Adult PT Treatment/Exercise - 05/09/16 0001      Knee/Hip Exercises: Stretches   Passive Hamstring Stretch Both;1 rep;30 seconds  with strap    Quad Stretch Both;30 seconds  prone with strap on elbows   Piriformis Stretch Both;30 seconds   Gastroc Stretch Both;30 seconds   Other Knee/Hip Stretches cross body stretch with strap 30 sec each     Knee/Hip Exercises: Aerobic   Nustep L5x5'     Knee/Hip Exercises:  Standing   Step Down Right;3 sets;5 reps;Step Height: 4"  some pain in quad tendon     Knee/Hip Exercises: Sidelying   Other Sidelying Knee/Hip Exercises 5 reps pilates side kicks, leg circles, hot potatos.                 PT Education - 05/09/16 0911    Education provided Yes   Education Details HEP    Person(s) Educated Patient   Methods Explanation;Demonstration;Handout   Comprehension Returned demonstration             PT Long Term Goals - 05/09/16 0915      PT LONG TERM GOAL #1   Title I with advanced HEP ( 05/23/16)    Status On-going     PT LONG TERM GOAL #2   Title report no pain in Rt knee with stairs ( 05/23/16)    Status On-going     PT LONG TERM GOAL #3   Title demo Rt glut med strength =/> 5-/5 ( 05/23/16)    Status Achieved     PT LONG TERM GOAL #4   Title improve  FOTO =/> 25% limited ( 05/23/16)    Status On-going               Plan - 05/09/16 0916    Clinical Impression Statement Benjamin Leonard is doing well, progressing to his goals, met the strength goal.  Not feeling 100% health wise so treatment session was shortened. Progressed HEP and issued black band for prior HEP    Rehab Potential Excellent   PT Frequency 1x / week   PT Duration 4 weeks   PT Treatment/Interventions Moist Heat;Therapeutic exercise;Dry needling;Taping;Manual techniques;Neuromuscular re-education;Cryotherapy;Electrical Stimulation;Patient/family education   PT Next Visit Plan progress HEP, eccentric quad and higher level glut med strengthening   Consulted and Agree with Plan of Care Patient      Patient will benefit from skilled therapeutic intervention in order to improve the following deficits and impairments:  Decreased strength, Pain, Impaired flexibility  Visit Diagnosis: Acute pain of right knee  Muscle weakness (generalized)     Problem List Patient Active Problem List   Diagnosis Date Noted  . Right patellofemoral syndrome 04/12/2016  .  Hyperlipidemia 01/10/2016  . Degenerative disc disease, cervical 01/10/2016  . Cough 08/05/2015  . Anxiety and depression 02/15/2015  . Left Achilles tendinitis 01/18/2015  . Right lumbar radiculitis 09/30/2014  . Hiatal hernia 09/03/2014  . Hemangioma of liver 08/06/2014  . Exertional chest pain 06/22/2014  . Urinary obstruction 12/08/2013  . Right distal Biceps tendon tear 12/04/2012  . Annual physical exam 03/08/2012  . Erectile dysfunction 10/19/2010  . Allergic rhinitis 10/19/2010  . COLITIS, ULCERATIVE NOS 05/27/2007    Jeral Pinch PT  05/09/2016, [place], [place], [address] Phone: [phone]   Fax:  [phone]  Name: Benjamin Leonard MRN: [id number] Date of Birth: 06-19-1966

## 2016-05-09 NOTE — Patient Instructions (Addendum)
Side Kick        [facility] [phone]    Lie on side, back straight along edge of mat, legs 30 in front of torso. Lift top leg to hip height, foot flexed. Exhale, kicking forward twice. Inhale, kicking twice backward with pointed foot. Keep leg hip height, torso still. Repeat __5-10__ times. Repeat on other side. Do __every other__ sessions per day.  Side Leg Circle    Lie on side, back straight along edge of mat, legs 30 in front of torso. Lift top leg to hip height. Rotate in small circle, _5-10___ times in each direction. Repeat __5-10__ times. Repeat on other side. Do __everyother__ sessions per day.  Over / Back: "Hot Potato"    Lie on side, back straight along edge of mat, legs 30 in front of torso. Touch toes of top foot in front twice of bottom leg, then lift leg up in an arch quickly touch in back twice. Arch up and repeat in front. Repeat _5-10___ times. Repeat on other side. Do __every other__ sessions per day.  Knee Extension: Step-Down Forward / Sideways / Backward (Eccentric)    Stand, holding support, affected foot on step. Slowly bend affected knee for 3-5 seconds and bring other heel forward to floor. Quickly straighten affected leg.ONLY PERFORM GOING FORWARD.  Repeat, placing foot flat to side. Repeat, touching toe behind. _5-10__ reps per set, _3__ sets per day, Willey Blade other_ day.  Copyright  VHI. All rights reserved.

## 2016-05-16 ENCOUNTER — Encounter: Payer: Self-pay | Admitting: Physical Therapy

## 2016-05-16 ENCOUNTER — Ambulatory Visit (INDEPENDENT_AMBULATORY_CARE_PROVIDER_SITE_OTHER): Payer: BLUE CROSS/BLUE SHIELD | Admitting: Physical Therapy

## 2016-05-16 DIAGNOSIS — M25561 Pain in right knee: Secondary | ICD-10-CM | POA: Diagnosis not present

## 2016-05-16 DIAGNOSIS — M6281 Muscle weakness (generalized): Secondary | ICD-10-CM

## 2016-05-16 NOTE — Therapy (Signed)
[place], [place], [address] Phone: [phone]   Fax:  [phone]  Physical Therapy Treatment  Patient Details  Name: Benjamin Leonard MRN: [id number] Date of Birth: 12-22-1965 Referring Provider: Dr Darene Lamer  Encounter Date: 05/16/2016      PT End of Session - 05/16/16 0852    Visit Number 3   Number of Visits 4   Date for PT Re-Evaluation 05/23/16   PT Start Time 0849   PT Stop Time 0938   PT Time Calculation (min) 49 min   Activity Tolerance Patient tolerated treatment well      Past Medical History:  Diagnosis Date  . Borderline hypertension   . Hearing loss    Right ear w/ hearing aid (new one in 2008)  . OAB (overactive bladder)    or BPH?  Marland Kitchen Ulcerative colitis     Past Surgical History:  Procedure Laterality Date  . RHINOPLASTY  1997  . right ear surgery for scar tissue removal  1996    There were no vitals filed for this visit.      Subjective Assessment - 05/16/16 0850    Subjective Pt reports the pain is waking him up at night the last two nights. Doesn't last long,  it is sore this AM    Patient Stated Goals get rid of knee pain, no pain with stairs   Currently in Pain? Yes   Pain Score 5    Pain Location Knee   Pain Orientation Right  quad tendon   Pain Descriptors / Indicators Aching;Sharp   Pain Type Acute pain   Pain Onset More than a month ago   Pain Frequency Intermittent   Aggravating Factors  stairs ( getting better)    Pain Relieving Factors gentle movement            [facility] PT Assessment - 05/16/16 0001      Assessment   Medical Diagnosis Rt patellofemoral syndrome                     [facility] Adult PT Treatment/Exercise - 05/16/16 0001      Knee/Hip Exercises: Stretches   Quad Stretch Both;30 seconds  with strap   Gastroc Stretch Both;30 seconds  on prostretch     Knee/Hip Exercises: Aerobic   Nustep L6x5'     Knee/Hip  Exercises: Standing   Heel Raises Both;3 sets;10 reps  off step, toes straight, in , out   Other Standing Knee Exercises 2x10 eccentric stand to sit Rt LE to high mat     Modalities   Modalities Ultrasound;Iontophoresis;Cryotherapy     Cryotherapy   Number Minutes Cryotherapy 12 Minutes   Cryotherapy Location Knee  Rt   Type of Cryotherapy Ice pack     Ultrasound   Ultrasound Location Rt quad tendon   Ultrasound Parameters 50% 1.0 mHz, 1.0w/cm2   Ultrasound Goals Pain     Iontophoresis   Type of Iontophoresis Dexamethasone   Location Rt quad tendon   Dose 1.0cc    Time 8 hr patch 88mAp                     PT Long Term Goals - 05/16/16 [id number]      PT LONG TERM GOAL #1   Title I with advanced HEP ( 05/23/16)    Status On-going     PT LONG TERM GOAL #2   Title report no pain in  Rt knee with stairs ( 05/23/16)    Status On-going  improving     PT LONG TERM GOAL #3   Title demo Rt glut med strength =/> 5-/5 ( 05/23/16)    Status Achieved     PT LONG TERM GOAL #4   Title improve FOTO =/> 25% limited ( 05/23/16)    Status On-going               Plan - 05/16/16 0926    Clinical Impression Statement This is Harrisons third visit, he is improving his strenght.  Pain has become localized in his Rt quad tendon, may be developing some tendonitis.  Will add in [place] and ionto to this and see if he gets relief.  He has done well with these modalities in the past on other body part.    Rehab Potential Excellent   PT Frequency 1x / week   PT Duration 4 weeks   PT Treatment/Interventions Moist Heat;Therapeutic exercise;Dry needling;Taping;Manual techniques;Neuromuscular re-education;Cryotherapy;Electrical Stimulation;Patient/family education   PT Next Visit Plan FOTO and re-assess   Consulted and Agree with Plan of Care Patient      Patient will benefit from skilled therapeutic intervention in order to improve the following deficits and impairments:  Decreased  strength, Pain, Impaired flexibility  Visit Diagnosis: Acute pain of right knee  Muscle weakness (generalized)     Problem List Patient Active Problem List   Diagnosis Date Noted  . Right patellofemoral syndrome 04/12/2016  . Hyperlipidemia 01/10/2016  . Degenerative disc disease, cervical 01/10/2016  . Cough 08/05/2015  . Anxiety and depression 02/15/2015  . Left Achilles tendinitis 01/18/2015  . Right lumbar radiculitis 09/30/2014  . Hiatal hernia 09/03/2014  . Hemangioma of liver 08/06/2014  . Exertional chest pain 06/22/2014  . Urinary obstruction 12/08/2013  . Right distal Biceps tendon tear 12/04/2012  . Annual physical exam 03/08/2012  . Erectile dysfunction 10/19/2010  . Allergic rhinitis 10/19/2010  . COLITIS, ULCERATIVE NOS 05/27/2007    Jeral Pinch PT  05/16/2016, 9:30 AM  [facility] [place], [place], [address] Phone: [phone]   Fax:  [phone]  Name: OWAIN WARK MRN: [id number] Date of Birth: 1966-02-07

## 2016-05-29 ENCOUNTER — Encounter: Payer: Self-pay | Admitting: Physical Therapy

## 2016-05-29 ENCOUNTER — Ambulatory Visit (INDEPENDENT_AMBULATORY_CARE_PROVIDER_SITE_OTHER): Payer: BLUE CROSS/BLUE SHIELD | Admitting: Physical Therapy

## 2016-05-29 DIAGNOSIS — M6281 Muscle weakness (generalized): Secondary | ICD-10-CM

## 2016-05-29 DIAGNOSIS — M25561 Pain in right knee: Secondary | ICD-10-CM | POA: Diagnosis not present

## 2016-05-29 NOTE — Therapy (Signed)
[place], [place], [address] Phone: [phone]   Fax:  [phone]  Physical Therapy Treatment  Patient Details  Name: Benjamin Leonard MRN: [id number] Date of Birth: 1965/09/23 Referring Provider: Dr Dianah Field  Encounter Date: 05/29/2016      PT End of Session - 05/29/16 0859    Visit Number 4   Number of Visits 4   Date for PT Re-Evaluation 05/23/16   PT Start Time 0850   PT Stop Time 0924   PT Time Calculation (min) 34 min   Activity Tolerance Patient tolerated treatment well      Past Medical History:  Diagnosis Date  . Borderline hypertension   . Hearing loss    Right ear w/ hearing aid (new one in 2008)  . OAB (overactive bladder)    or BPH?  Marland Kitchen Ulcerative colitis     Past Surgical History:  Procedure Laterality Date  . RHINOPLASTY  1997  . right ear surgery for scar tissue removal  1996    There were no vitals filed for this visit.      Subjective Assessment - 05/29/16 0847    Subjective Knee is doing well, able to walk up/down the beach. no knee issues.    Currently in Pain? No/denies            [facility] PT Assessment - 05/29/16 0001      Assessment   Medical Diagnosis Rt patellofemoral syndrome   Referring Provider Dr Dianah Field   Onset Date/Surgical Date 02/23/16     Observation/Other Assessments   Focus on Therapeutic Outcomes (FOTO)  10% limited     Lunges   Comments slight twinge with Rt knee forward     Strength   Right/Left Hip --  Lt WNL    Right Hip Flexion 5/5   Right Hip ABduction 5/5     Flexibility   Hamstrings Rt 75, Lt 88                     [facility] Adult PT Treatment/Exercise - 05/29/16 0001      Exercises   Exercises Knee/Hip     Knee/Hip Exercises: Stretches   Passive Hamstring Stretch Both;3 reps;30 seconds  with strap    ITB Stretch Both;2 reps;30 seconds  standing     Knee/Hip Exercises: Aerobic   Nustep  L6x5'     Knee/Hip Exercises: Standing   Forward Lunges Both;10 reps  then pulsing lunges 8x3 reps each side.    SLS 2x10 Rt FWD reach to the floor   Other Standing Knee Exercises sumo squats, 10 reps, then 8x3 pulsing   Other Standing Knee Exercises 2x10 reps, high kneel to stand , holding 5# in each hand                     PT Long Term Goals - 05/29/16 0858      PT LONG TERM GOAL #1   Title I with advanced HEP ( 05/23/16)    Status Achieved     PT LONG TERM GOAL #2   Title report no pain in Rt knee with stairs ( 05/23/16)    Status Achieved     PT LONG TERM GOAL #3   Title demo Rt glut med strength =/> 5-/5 ( 05/23/16)    Status Achieved     PT LONG TERM GOAL #4   Title improve FOTO =/> 25% limited ( 05/23/16)  Status Achieved  10% limited               Plan - 05/29/16 0900    Clinical Impression Statement Benjamin Leonard had done very well with improving his strength, he still has tight hamstrings Rt > Lt.  All goals met, ready for discharge of knee treatment    PT Next Visit Plan discharge knee, evaluate for lumbar per new order from a different MD    Consulted and Agree with Plan of Care Patient      Patient will benefit from skilled therapeutic intervention in order to improve the following deficits and impairments:     Visit Diagnosis: Acute pain of right knee  Muscle weakness (generalized)     Problem List Patient Active Problem List   Diagnosis Date Noted  . Right patellofemoral syndrome 04/12/2016  . Hyperlipidemia 01/10/2016  . Degenerative disc disease, cervical 01/10/2016  . Cough 08/05/2015  . Anxiety and depression 02/15/2015  . Left Achilles tendinitis 01/18/2015  . Right lumbar radiculitis 09/30/2014  . Hiatal hernia 09/03/2014  . Hemangioma of liver 08/06/2014  . Exertional chest pain 06/22/2014  . Urinary obstruction 12/08/2013  . Right distal Biceps tendon tear 12/04/2012  . Annual physical exam 03/08/2012  .  Erectile dysfunction 10/19/2010  . Allergic rhinitis 10/19/2010  . COLITIS, ULCERATIVE NOS 05/27/2007    Jeral Pinch PT  05/29/2016, 9:25 AM  [facility] [place], [place], [address] Phone: [phone]   Fax:  [phone]  Name: Benjamin Leonard MRN: [id number] Date of Birth: 05/16/1966  PHYSICAL THERAPY DISCHARGE SUMMARY  Visits from Start of Care: 4  Current functional level related to goals / functional outcomes: See above   Remaining deficits: None as relates to knee   Education / Equipment: HEP  Plan: Patient agrees to discharge.  Patient goals were met. Patient is being discharged due to meeting the stated rehab goals.  ?????    Jeral Pinch, PT 05/29/16 9:27 AM

## 2016-06-06 ENCOUNTER — Ambulatory Visit (INDEPENDENT_AMBULATORY_CARE_PROVIDER_SITE_OTHER): Payer: BLUE CROSS/BLUE SHIELD | Admitting: Physical Therapy

## 2016-06-06 ENCOUNTER — Encounter: Payer: Self-pay | Admitting: Physical Therapy

## 2016-06-06 DIAGNOSIS — M436 Torticollis: Secondary | ICD-10-CM | POA: Diagnosis not present

## 2016-06-06 DIAGNOSIS — M6281 Muscle weakness (generalized): Secondary | ICD-10-CM

## 2016-06-06 DIAGNOSIS — M2569 Stiffness of other specified joint, not elsewhere classified: Secondary | ICD-10-CM

## 2016-06-06 DIAGNOSIS — R29898 Other symptoms and signs involving the musculoskeletal system: Secondary | ICD-10-CM

## 2016-06-06 DIAGNOSIS — M2560 Stiffness of unspecified joint, not elsewhere classified: Secondary | ICD-10-CM

## 2016-06-06 NOTE — Therapy (Signed)
[place], [place], [address] Phone: [phone]   Fax:  [phone]  Physical Therapy Evaluation  Patient Details  Name: Benjamin Leonard MRN: [id number] Date of Birth: [birth year] Referring Provider: Dian Situ, MD  Encounter Date: 06/06/2016      PT End of Session - 06/06/16 0947    Visit Number 1   Number of Visits 6   Date for PT Re-Evaluation 07/20/16   PT Start Time 0855   PT Stop Time 0940   PT Time Calculation (min) 45 min   Activity Tolerance Patient tolerated treatment well   Behavior During Therapy [facility] for tasks assessed/performed      Past Medical History:  Diagnosis Date  . Borderline hypertension   . Hearing loss    Right ear w/ hearing aid (new one in 2008)  . OAB (overactive bladder)    or BPH?  Marland Kitchen Ulcerative colitis     Past Surgical History:  Procedure Laterality Date  . RHINOPLASTY  1997  . right ear surgery for scar tissue removal  1996    There were no vitals filed for this visit.       Subjective Assessment - 06/06/16 0900    Subjective Long history of on going back pain. Recently referred to pain managment clinic and now to have 6 weeks of physical therapy before continuing on with possible injections. Pt denies any specific injury in the past to cause the pain.    Pertinent History h/o bilat gastroc/achilles tears , low back and cervical issues - getting this assessed. started with perferred pain management last week, years of chiropractic care.    Limitations Sitting;Standing;Walking   How long can you sit comfortably? unlimited   How long can you stand comfortably? 2 hours   How long can you walk comfortably? unlimited   Diagnostic tests X-rays low back, x-ray/MRI neck   Patient Stated Goals less pain, more activity   Currently in Pain? Yes   Pain Score 3    Pain Location Back   Pain Orientation Lower;Right;Left   Pain Descriptors / Indicators  Tightness;Dull   Pain Type Chronic pain   Pain Radiating Towards none   Pain Onset More than a month ago   Pain Frequency Intermittent   Aggravating Factors  long periods of standing   Pain Relieving Factors walking, rest   Multiple Pain Sites Yes   Pain Score 3   Pain Location Neck   Pain Orientation Right;Left   Pain Descriptors / Indicators Sharp;Tightness   Pain Type Chronic pain   Pain Radiating Towards into shoulderblades   Pain Onset More than a month ago   Pain Frequency Intermittent   Aggravating Factors  unsure   Pain Relieving Factors ice            [facility] PT Assessment - 06/06/16 0001      Assessment   Medical Diagnosis lumbosacral spondylosis, cervical spondylosis with neck shoulder pain.    Referring Provider Dian Situ, MD   Onset Date/Surgical Date --  years ago   Hand Dominance Right   Next MD Visit 06/26/16   Prior Therapy not for this     Precautions   Precautions None     Observation/Other Assessments   Observations --  cervical lateral shift Rt   Focus on Therapeutic Outcomes (FOTO)  28% limitation     Posture/Postural Control   Posture Comments decreased lumbar lordosis     AROM   Cervical  Flexion WFL   Cervical Extension WFL  tight catch with bias toward Rt side   Cervical - Right Side Bend 28  reports feeling tighter   Cervical - Left Side Bend 33   Cervical - Right Rotation WFL  Reports feeling tight in Lt cervical region   Cervical - Left Rotation [facility]   Lumbar Flexion 11 cm   Lumbar Extension 50%   Lumbar - Right Side Bend 48   Lumbar - Left Side Bend 49  tighter going Lt   Lumbar - Right Rotation WFL   Lumbar - Left Rotation WFL     Flexibility   Hamstrings Rt 33, Lt 28     Palpation   Palpation comment increased muscle tension through bilateral upper trap, and interscapular musculature. Trigger points in upper trap with resulting muscle spasms in scapular musculature.                    [facility] Adult PT  Treatment/Exercise - 06/06/16 0001      Lumbar Exercises: Supine   Ab Set 10 reps;5 seconds   AB Set Limitations cues for technique     Neck Exercises: Stretches   Upper Trapezius Stretch 3 reps;30 seconds   Levator Stretch 3 reps;30 seconds                PT Education - 06/06/16 0946    Education provided Yes   Education Details HEP   Person(s) Educated Patient   Methods Explanation;Demonstration;Tactile cues;Verbal cues;Handout   Comprehension Verbalized understanding;Returned demonstration             PT Long Term Goals - 06/06/16 [id number]      PT LONG TERM GOAL #1   Title I with advanced HEP (07/20/16)    Time 6   Period Weeks   Status New     PT LONG TERM GOAL #2   Title report 50% improvement in lowback pain (07/20/16)    Time 6   Period Weeks   Status New     PT LONG TERM GOAL #3   Title Pt to report ability to stand at work with 25% decreased pain by end of shift.    Time 6   Period Weeks   Status New     PT LONG TERM GOAL #4   Title improve FOTO =/< 24% limited (07/20/16)                Plan - 06/06/16 0948    Clinical Impression Statement Pt is presenting with long standing history of ongoing low back and cervical pain. Upon completion of the PT evaluation the pt has significant postural deviations, decreased flexibility and stability through the lumbar, LE, and cervical regions. The pt would benefit from skilled PT to address these area through a HEP and clinic based activity/treatement.    Rehab Potential Good   PT Frequency 1x / week   PT Duration 6 weeks   PT Treatment/Interventions ADLs/Self Care Home Management;Electrical Stimulation;Cryotherapy;Moist Heat;Ultrasound;Patient/family education;Therapeutic exercise;Therapeutic activities;Manual techniques;Taping;Dry needling   PT Next Visit Plan Progress with core/scapular stabilization exercises.   Consulted and Agree with Plan of Care Patient      Patient will benefit from  skilled therapeutic intervention in order to improve the following deficits and impairments:  Decreased activity tolerance, Decreased range of motion, Decreased strength, Postural dysfunction, Impaired flexibility, Pain  Visit Diagnosis: Muscle weakness (generalized) - Plan: PT plan of care cert/re-cert  Other symptoms and signs involving the musculoskeletal system -  Plan: PT plan of care cert/re-cert  Neck stiffness - Plan: PT plan of care cert/re-cert  Back stiffness - Plan: PT plan of care cert/re-cert     Problem List Patient Active Problem List   Diagnosis Date Noted  . Right patellofemoral syndrome 04/12/2016  . Hyperlipidemia 01/10/2016  . Degenerative disc disease, cervical 01/10/2016  . Cough 08/05/2015  . Anxiety and depression 02/15/2015  . Left Achilles tendinitis 01/18/2015  . Right lumbar radiculitis 09/30/2014  . Hiatal hernia 09/03/2014  . Hemangioma of liver 08/06/2014  . Exertional chest pain 06/22/2014  . Urinary obstruction 12/08/2013  . Right distal Biceps tendon tear 12/04/2012  . Annual physical exam 03/08/2012  . Erectile dysfunction 10/19/2010  . Allergic rhinitis 10/19/2010  . COLITIS, ULCERATIVE NOS 05/27/2007    Linard Millers, PT, CSCS 06/06/2016, 10:07 AM  [facility] [place], [place], [address] Phone: [phone]   Fax:  [phone]  Name: Benjamin Leonard MRN: [id number] Date of Birth: 18-Mar-1966

## 2016-06-13 ENCOUNTER — Ambulatory Visit (INDEPENDENT_AMBULATORY_CARE_PROVIDER_SITE_OTHER): Payer: BLUE CROSS/BLUE SHIELD | Admitting: Physical Therapy

## 2016-06-13 ENCOUNTER — Encounter: Payer: Self-pay | Admitting: Physical Therapy

## 2016-06-13 DIAGNOSIS — M436 Torticollis: Secondary | ICD-10-CM

## 2016-06-13 DIAGNOSIS — M2569 Stiffness of other specified joint, not elsewhere classified: Secondary | ICD-10-CM

## 2016-06-13 DIAGNOSIS — M6281 Muscle weakness (generalized): Secondary | ICD-10-CM

## 2016-06-13 DIAGNOSIS — M2560 Stiffness of unspecified joint, not elsewhere classified: Secondary | ICD-10-CM

## 2016-06-13 NOTE — Therapy (Signed)
[place], [place], [address] Phone: [phone]   Fax:  [phone]  Physical Therapy Treatment  Patient Details  Name: Benjamin Leonard MRN: [id number] Date of Birth: 12/21/1965 Referring Provider: Dian Situ, MD  Encounter Date: 06/13/2016      PT End of Session - 06/13/16 1033    Visit Number 2   Number of Visits 6   Date for PT Re-Evaluation 07/20/16   PT Start Time 0929   PT Stop Time 1016   PT Time Calculation (min) 47 min   Activity Tolerance Patient tolerated treatment well   Behavior During Therapy [facility] for tasks assessed/performed      Past Medical History:  Diagnosis Date  . Borderline hypertension   . Hearing loss    Right ear w/ hearing aid (new one in 2008)  . OAB (overactive bladder)    or BPH?  Marland Kitchen Ulcerative colitis     Past Surgical History:  Procedure Laterality Date  . RHINOPLASTY  1997  . right ear surgery for scar tissue removal  1996    There were no vitals filed for this visit.      Subjective Assessment - 06/13/16 0933    Subjective Nothing new really, feeling about the same really.   Currently in Pain? Yes   Pain Score 2    Pain Location Back   Pain Orientation Right;Left   Pain Descriptors / Indicators Tightness;Dull   Pain Type Chronic pain   Aggravating Factors  reaching, work positions    Pain Relieving Factors rest                         [facility] Adult PT Treatment/Exercise - 06/13/16 0001      Lumbar Exercises: Stretches   ITB Stretch 2 reps;20 seconds   Passive Hamstring Stretch Both;3 reps;30 seconds  with strap      Lumbar Exercises: Seated   Other Seated Lumbar Exercises hooklying ball squeeze 2x10     Lumbar Exercises: Supine   Ab Set 10 reps;5 seconds   AB Set Limitations cues for technique   Other Supine Lumbar Exercises tilt with leg extension - bilateral 1X10   Other Supine Lumbar Exercises hooklying trunk  rotation isometrics.  2X10     Knee/Hip Exercises: Aerobic   Nustep L6x6'     Knee/Hip Exercises: Standing   Lateral Step Up 1 set;10 reps;Step Height: 6"   Lateral Step Up Limitations focus on core activation   Other Standing Knee Exercises band pulls blue - 2x10 with SLS     Knee/Hip Exercises: Sidelying   Hip ABduction Strengthening;Both;10 reps   Hip ABduction Limitations reports similar area of back pain                PT Education - 06/13/16 1031    Education provided Yes   Education Details core stabilization with standing activity.   Person(s) Educated Patient   Methods Explanation   Comprehension Verbalized understanding             PT Long Term Goals - 06/13/16 1115      PT LONG TERM GOAL #1   Title I with advanced HEP (07/20/16)    Time 6   Period Weeks   Status On-going     PT LONG TERM GOAL #2   Title report 50% improvement in lowback pain (07/20/16)    Time 6   Period Weeks   Status  On-going     PT LONG TERM GOAL #3   Title Pt to report ability to stand at work with 25% decreased pain by end of shift.    Time 6   Period Weeks   Status On-going     PT LONG TERM GOAL #4   Time 6   Period Weeks   Status On-going               Plan - 06/13/16 1034    Clinical Impression Statement Pt tolerating session well with increased focus on flexibility and core strengthening. Will continue to benefit from ongoing strengthening through the core musculature. Will attempt to incorporate standing positions for more functional strengthening.    Rehab Potential Good   PT Frequency 1x / week   PT Duration 6 weeks   PT Treatment/Interventions ADLs/Self Care Home Management;Electrical Stimulation;Cryotherapy;Moist Heat;Ultrasound;Patient/family education;Therapeutic exercise;Therapeutic activities;Manual techniques;Taping;Dry needling   PT Next Visit Plan Incorporate more standing exercises with functional core strengthening.    Consulted and Agree  with Plan of Care Patient      Patient will benefit from skilled therapeutic intervention in order to improve the following deficits and impairments:  Decreased activity tolerance, Decreased range of motion, Decreased strength, Postural dysfunction, Impaired flexibility, Pain  Visit Diagnosis: Muscle weakness (generalized)  Neck stiffness  Back stiffness     Problem List Patient Active Problem List   Diagnosis Date Noted  . Right patellofemoral syndrome 04/12/2016  . Hyperlipidemia 01/10/2016  . Degenerative disc disease, cervical 01/10/2016  . Cough 08/05/2015  . Anxiety and depression 02/15/2015  . Left Achilles tendinitis 01/18/2015  . Right lumbar radiculitis 09/30/2014  . Hiatal hernia 09/03/2014  . Hemangioma of liver 08/06/2014  . Exertional chest pain 06/22/2014  . Urinary obstruction 12/08/2013  . Right distal Biceps tendon tear 12/04/2012  . Annual physical exam 03/08/2012  . Erectile dysfunction 10/19/2010  . Allergic rhinitis 10/19/2010  . COLITIS, ULCERATIVE NOS 05/27/2007    Linard Millers, PT, CSCS 06/13/2016, 11:17 AM  [facility] [place], [place], [address] Phone: [phone]   Fax:  [phone]  Name: Benjamin Leonard MRN: [id number] Date of Birth: [birth year]

## 2016-06-20 ENCOUNTER — Encounter: Payer: Self-pay | Admitting: Physical Therapy

## 2016-06-20 ENCOUNTER — Ambulatory Visit (INDEPENDENT_AMBULATORY_CARE_PROVIDER_SITE_OTHER): Payer: BLUE CROSS/BLUE SHIELD | Admitting: Physical Therapy

## 2016-06-20 DIAGNOSIS — M2560 Stiffness of unspecified joint, not elsewhere classified: Secondary | ICD-10-CM

## 2016-06-20 DIAGNOSIS — M6281 Muscle weakness (generalized): Secondary | ICD-10-CM

## 2016-06-20 DIAGNOSIS — M436 Torticollis: Secondary | ICD-10-CM | POA: Diagnosis not present

## 2016-06-20 DIAGNOSIS — M2569 Stiffness of other specified joint, not elsewhere classified: Secondary | ICD-10-CM

## 2016-06-20 NOTE — Therapy (Signed)
[place], [place], [address] Phone: [phone]   Fax:  [phone]  Physical Therapy Treatment  Patient Details  Name: Benjamin Leonard MRN: [id number] Date of Birth: 10/21/1965 Referring Provider: Dian Situ, MD  Encounter Date: 06/20/2016      PT End of Session - 06/20/16 0924    Visit Number 3   Number of Visits 6   Date for PT Re-Evaluation 07/20/16   PT Start Time 0845   PT Stop Time 0928   PT Time Calculation (min) 43 min   Activity Tolerance Patient tolerated treatment well   Behavior During Therapy [facility] for tasks assessed/performed      Past Medical History:  Diagnosis Date  . Borderline hypertension   . Hearing loss    Right ear w/ hearing aid (new one in 2008)  . OAB (overactive bladder)    or BPH?  Marland Kitchen Ulcerative colitis     Past Surgical History:  Procedure Laterality Date  . RHINOPLASTY  1997  . right ear surgery for scar tissue removal  1996    There were no vitals filed for this visit.      Subjective Assessment - 06/20/16 0849    Subjective I feel more stiff today, especially my neck   Pertinent History h/o bilat gastroc/achilles tears , low back and cervical issues - getting this assessed. started with perferred pain management last week, years of chiropractic care.    Currently in Pain? No/denies  just stiff                         [facility] Adult PT Treatment/Exercise - 06/20/16 0001      Exercises   Exercises Neck     Neck Exercises: Supine   Neck Retraction 10 reps;5 secs   Other Supine Exercise horizontal abduction with blue band x 20     Lumbar Exercises: Standing   Row 20 reps  focus on core control   Theraband Level (Row) Level 4 (Blue)   Shoulder Extension 20 reps  focus on eccentric control and core control   Theraband Level (Shoulder Extension) Level 4 (Blue)     Lumbar Exercises: Supine   AB Set Limitations ab set with  marching x 10   Other Supine Lumbar Exercises dead bug with core activation x 20   Other Supine Lumbar Exercises hooklying trunk rotation with manual resistance x 10     Knee/Hip Exercises: Aerobic   Nustep L6 x 6 mins     Knee/Hip Exercises: Sidelying   Hip ABduction Strengthening;Both;20 reps   Hip ABduction Limitations more difficulty Rt vs Lt     Manual Therapy   Manual Therapy Passive ROM   Passive ROM passive cervical ROM in all directions                     PT Long Term Goals - 06/20/16 [id number]      PT LONG TERM GOAL #1   Title I with advanced HEP (07/20/16)    Status On-going     PT LONG TERM GOAL #2   Title report 50% improvement in lowback pain (07/20/16)    Status On-going     PT LONG TERM GOAL #3   Title Pt to report ability to stand at work with 25% decreased pain by end of shift.    Status On-going     PT LONG TERM GOAL #4   Title  improve FOTO =/< 24% limited (07/20/16)    Status On-going               Plan - 06/20/16 0924    Clinical Impression Statement Pt tolerated treatment well with increased focus on neck today due to increased stiffness today. Pt improving core activaiton with all activities, continues to be limited in neck ROM and fucntional strength   Rehab Potential Good   PT Frequency 1x / week   PT Duration 6 weeks   PT Treatment/Interventions ADLs/Self Care Home Management;Electrical Stimulation;Cryotherapy;Moist Heat;Ultrasound;Patient/family education;Therapeutic exercise;Therapeutic activities;Manual techniques;Taping;Dry needling   PT Next Visit Plan continue to progress core strength in standing   Consulted and Agree with Plan of Care Patient      Patient will benefit from skilled therapeutic intervention in order to improve the following deficits and impairments:  Decreased activity tolerance, Decreased range of motion, Decreased strength, Postural dysfunction, Impaired flexibility, Pain  Visit Diagnosis: Muscle  weakness (generalized)  Neck stiffness  Back stiffness     Problem List Patient Active Problem List   Diagnosis Date Noted  . Right patellofemoral syndrome 04/12/2016  . Hyperlipidemia 01/10/2016  . Degenerative disc disease, cervical 01/10/2016  . Cough 08/05/2015  . Anxiety and depression 02/15/2015  . Left Achilles tendinitis 01/18/2015  . Right lumbar radiculitis 09/30/2014  . Hiatal hernia 09/03/2014  . Hemangioma of liver 08/06/2014  . Exertional chest pain 06/22/2014  . Urinary obstruction 12/08/2013  . Right distal Biceps tendon tear 12/04/2012  . Annual physical exam 03/08/2012  . Erectile dysfunction 10/19/2010  . Allergic rhinitis 10/19/2010  . COLITIS, ULCERATIVE NOS 05/27/2007    Isabelle Course, PT, DPT 06/20/2016, 9:28 AM  [facility] [place], [place], [address] Phone: [phone]   Fax:  [phone]  Name: Benjamin Leonard MRN: [id number] Date of Birth: June 15, 1966

## 2016-06-27 ENCOUNTER — Ambulatory Visit (INDEPENDENT_AMBULATORY_CARE_PROVIDER_SITE_OTHER): Payer: BLUE CROSS/BLUE SHIELD | Admitting: Physical Therapy

## 2016-06-27 ENCOUNTER — Encounter: Payer: Self-pay | Admitting: Physical Therapy

## 2016-06-27 DIAGNOSIS — M2569 Stiffness of other specified joint, not elsewhere classified: Secondary | ICD-10-CM

## 2016-06-27 DIAGNOSIS — R29898 Other symptoms and signs involving the musculoskeletal system: Secondary | ICD-10-CM

## 2016-06-27 DIAGNOSIS — M436 Torticollis: Secondary | ICD-10-CM

## 2016-06-27 DIAGNOSIS — M2560 Stiffness of unspecified joint, not elsewhere classified: Secondary | ICD-10-CM

## 2016-06-27 DIAGNOSIS — M6281 Muscle weakness (generalized): Secondary | ICD-10-CM

## 2016-06-27 NOTE — Therapy (Signed)
[place], [place], [address] Phone: [phone]   Fax:  [phone]  Physical Therapy Treatment  Patient Details  Name: Benjamin Leonard MRN: [id number] Date of Birth: 1966/02/17 Referring Provider: Dian Situ, MD  Encounter Date: 06/27/2016      PT End of Session - 06/27/16 1213    Visit Number 4   Number of Visits 6   Date for PT Re-Evaluation 07/20/16   PT Start Time 0931   PT Stop Time 1015   PT Time Calculation (min) 44 min   Activity Tolerance Patient tolerated treatment well   Behavior During Therapy [facility] for tasks assessed/performed      Past Medical History:  Diagnosis Date  . Borderline hypertension   . Hearing loss    Right ear w/ hearing aid (new one in 2008)  . OAB (overactive bladder)    or BPH?  Marland Kitchen Ulcerative colitis     Past Surgical History:  Procedure Laterality Date  . RHINOPLASTY  1997  . right ear surgery for scar tissue removal  1996    There were no vitals filed for this visit.      Subjective Assessment - 06/27/16 0934    Subjective Just got his second injection yesterday morning. It really helped but it's wearing off.    Currently in Pain? Yes   Pain Score 5    Pain Location Back   Pain Orientation Right;Left   Pain Descriptors / Indicators Aching   Pain Type Chronic pain   Pain Onset More than a month ago                         [facility] Adult PT Treatment/Exercise - 06/27/16 0001      Neck Exercises: Seated   Other Seated Exercise on T-ball - dbl hand chop/lift bilateral 1X10 each direction     Lumbar Exercises: Standing   Row 10 reps   Theraband Level (Row) Level 3 (Green)   Row Limitations on T-ball   Shoulder Extension 10 reps   Theraband Level (Shoulder Extension) Level 3 (Green)   Shoulder Extension Limitations on T-ball with scapular retraction     Lumbar Exercises: Seated   Other Seated Lumbar Exercises seat alt/opp  hip/knee flexion 3 lb bells 2X10     Lumbar Exercises: Supine   Other Supine Lumbar Exercises pelvic tilt with leg extension     Knee/Hip Exercises: Stretches   Passive Hamstring Stretch Both;30 seconds;2 reps  with strap      Knee/Hip Exercises: Aerobic   Nustep L6 x 6 mins     Knee/Hip Exercises: Standing   Lateral Step Up 10 reps;Step Height: 6";2 sets   Lateral Step Up Limitations focus on core activation     Knee/Hip Exercises: Sidelying   Hip ABduction Strengthening;Both;20 reps   Hip ABduction Limitations manual resist                PT Education - 06/27/16 1213    Education provided Yes   Education Details reinforcing core activation with standing and sitting activities   Person(s) Educated Patient   Methods Explanation;Demonstration;Tactile cues;Verbal cues   Comprehension Verbalized understanding;Returned demonstration             PT Long Term Goals - 06/27/16 1344      PT LONG TERM GOAL #1   Title I with advanced HEP (07/20/16)    Time 6   Period Weeks  Status On-going     PT LONG TERM GOAL #2   Title report 50% improvement in low back pain (07/20/16)    Time 6   Period Weeks   Status On-going     PT LONG TERM GOAL #3   Title Pt to report ability to stand at work with 25% decreased pain by end of shift.    Time 6   Status On-going     PT LONG TERM GOAL #4   Title improve FOTO =/< 24% limited (07/20/16)    Time 6   Period Weeks   Status On-going     PT LONG TERM GOAL #5   Title improve FOTO =/< 31% limited ( 05/16/15)   Baseline FOTO 32% limitation   Time 4   Period Weeks   Status Partially Met               Plan - 06/27/16 1342    Clinical Impression Statement Incorporating exercises for both cervical and lumbar region with focus on stabilization. Pt denies any increased pain following session. Will continue for 2 more sessions then assess progress.    Rehab Potential Good   PT Frequency 1x / week   PT Duration 6  weeks   PT Treatment/Interventions ADLs/Self Care Home Management;Electrical Stimulation;Cryotherapy;Moist Heat;Ultrasound;Patient/family education;Therapeutic exercise;Therapeutic activities;Manual techniques;Taping;Dry needling   PT Next Visit Plan continue to progress core strength/cercical stabilization. T-ball exercises.    PT Home Exercise Plan t-ball exercise program.    Consulted and Agree with Plan of Care Patient      Patient will benefit from skilled therapeutic intervention in order to improve the following deficits and impairments:  Decreased activity tolerance, Decreased range of motion, Decreased strength, Postural dysfunction, Impaired flexibility, Pain  Visit Diagnosis: Muscle weakness (generalized)  Neck stiffness  Back stiffness  Other symptoms and signs involving the musculoskeletal system     Problem List Patient Active Problem List   Diagnosis Date Noted  . Right patellofemoral syndrome 04/12/2016  . Hyperlipidemia 01/10/2016  . Degenerative disc disease, cervical 01/10/2016  . Cough 08/05/2015  . Anxiety and depression 02/15/2015  . Left Achilles tendinitis 01/18/2015  . Right lumbar radiculitis 09/30/2014  . Hiatal hernia 09/03/2014  . Hemangioma of liver 08/06/2014  . Exertional chest pain 06/22/2014  . Urinary obstruction 12/08/2013  . Right distal Biceps tendon tear 12/04/2012  . Annual physical exam 03/08/2012  . Erectile dysfunction 10/19/2010  . Allergic rhinitis 10/19/2010  . COLITIS, ULCERATIVE NOS 05/27/2007    Linard Millers, PT, CSCS 06/27/2016, 1:45 PM  [facility] [place], [place], [address] Phone: [phone]   Fax:  [phone]  Name: [name] MRN: [id number] Date of Birth: [birth year]

## 2016-07-04 ENCOUNTER — Encounter: Payer: BLUE CROSS/BLUE SHIELD | Admitting: Physical Therapy

## 2016-07-11 ENCOUNTER — Ambulatory Visit (INDEPENDENT_AMBULATORY_CARE_PROVIDER_SITE_OTHER): Payer: BLUE CROSS/BLUE SHIELD | Admitting: Physical Therapy

## 2016-07-11 DIAGNOSIS — M6281 Muscle weakness (generalized): Secondary | ICD-10-CM | POA: Diagnosis not present

## 2016-07-11 DIAGNOSIS — R29898 Other symptoms and signs involving the musculoskeletal system: Secondary | ICD-10-CM

## 2016-07-11 DIAGNOSIS — M436 Torticollis: Secondary | ICD-10-CM | POA: Diagnosis not present

## 2016-07-11 DIAGNOSIS — M2569 Stiffness of other specified joint, not elsewhere classified: Secondary | ICD-10-CM

## 2016-07-11 DIAGNOSIS — M2560 Stiffness of unspecified joint, not elsewhere classified: Secondary | ICD-10-CM | POA: Diagnosis not present

## 2016-07-11 NOTE — Therapy (Signed)
[place], [place], [address] Phone: [phone]   Fax:  [phone]  Physical Therapy Treatment  Patient Details  Name: Benjamin Leonard MRN: [id number] Date of Birth: [birth year] Referring Provider: Dian Situ, MD  Encounter Date: 07/11/2016      PT End of Session - 07/11/16 0901    Visit Number 5   Number of Visits 6   Date for PT Re-Evaluation 07/20/16   PT Start Time [id number]  pt arrived late   PT Stop Time 0934   PT Time Calculation (min) 41 min      Past Medical History:  Diagnosis Date  . Borderline hypertension   . Hearing loss    Right ear w/ hearing aid (new one in 2008)  . OAB (overactive bladder)    or BPH?  Marland Kitchen Ulcerative colitis     Past Surgical History:  Procedure Laterality Date  . RHINOPLASTY  1997  . right ear surgery for scar tissue removal  1996    There were no vitals filed for this visit.      Subjective Assessment - 07/11/16 0901    Subjective "it's a good day".   He reports his back support he wears at work has really helped.  Injections in low back have helped.  He continues to stretch each day.     Currently in Pain? Yes   Pain Score 1    Pain Location Back   Pain Orientation Right;Left   Pain Descriptors / Indicators Dull   Aggravating Factors  reaching, prolonged work positions   Pain Relieving Factors rest.             [facility] PT Assessment - 07/11/16 0001      Assessment   Medical Diagnosis lumbosacral spondylosis, cervical spondylosis with neck shoulder pain.    Referring Provider Dian Situ, MD   Hand Dominance Right   Prior Therapy not for this     AROM   Cervical Flexion 45   Cervical Extension 52   Cervical - Right Side Bend 35   Cervical - Left Side Bend 34   Cervical - Right Rotation 68   Cervical - Left Rotation 62   Lumbar Flexion fingers touching ankles.    Lumbar - Right Side Bend 41   Lumbar - Left Side Bend 32           [facility] Adult PT Treatment/Exercise - 07/11/16 0001      Lumbar Exercises: Aerobic   Stationary Bike NuStep (arms and legs) L5: 5 min      Lumbar Exercises: Standing   Row 10 reps   Theraband Level (Row) Level 3 (Green)   Shoulder Extension 10 reps   Theraband Level (Shoulder Extension) Level 3 (Green)   Other Standing Lumbar Exercises reverse wood chop with green band x 10 reps each side.      Lumbar Exercises: Seated   Hip Flexion on Ball Limitations Opp arm/leg with 3# in hands x 10 reps each side.    Other Seated Lumbar Exercises Wood chop seated on stool x 10 reps each side.       Lumbar Exercises: Quadruped   Opposite Arm/Leg Raise Right arm/Left leg;Left arm/Right leg;10 reps;2 seconds     Knee/Hip Exercises: Stretches   Passive Hamstring Stretch Both;30 seconds;2 reps  with strap    Other Knee/Hip Stretches ITB stretch in standing x 30 sec each leg.      Neck Exercises: Stretches  Upper Trapezius Stretch 2 reps;20 seconds   Levator Stretch 2 reps;20 seconds             PT Long Term Goals - 07/11/16 0920      PT LONG TERM GOAL #1   Title I with advanced HEP (07/20/16)    Time 6   Period Weeks   Status On-going     PT LONG TERM GOAL #2   Title report 50% improvement in low back pain (07/20/16)    Time 6   Period Weeks   Status On-going  10% improvement.      PT LONG TERM GOAL #3   Title Pt to report ability to stand at work with 25% decreased pain by end of shift.    Time 6   Period Weeks   Status On-going  no improvement yet, per pt 07/11/16     PT LONG TERM GOAL #4   Title improve FOTO =/< 24% limited (07/20/16)    Time 6   Period Weeks   Status On-going               Plan - 07/11/16 0927    Clinical Impression Statement Pt tolerated all exercises well, only reporting increase in discomfort in Lt low back with seated hip/shoulder flexion (with 3# in Rt hand).  This pain was reduced with rest.  He reports 10% improvement in back pain  since initiating therapy.     Rehab Potential Good   PT Frequency 1x / week   PT Duration 6 weeks   PT Treatment/Interventions ADLs/Self Care Home Management;Electrical Stimulation;Cryotherapy;Moist Heat;Ultrasound;Patient/family education;Therapeutic exercise;Therapeutic activities;Manual techniques;Taping;Dry needling   PT Next Visit Plan Assess progress and need for additional sessions.       Patient will benefit from skilled therapeutic intervention in order to improve the following deficits and impairments:  Decreased activity tolerance, Decreased range of motion, Decreased strength, Postural dysfunction, Impaired flexibility, Pain  Visit Diagnosis: Muscle weakness (generalized)  Neck stiffness  Back stiffness  Other symptoms and signs involving the musculoskeletal system     Problem List Patient Active Problem List   Diagnosis Date Noted  . Right patellofemoral syndrome 04/12/2016  . Hyperlipidemia 01/10/2016  . Degenerative disc disease, cervical 01/10/2016  . Cough 08/05/2015  . Anxiety and depression 02/15/2015  . Left Achilles tendinitis 01/18/2015  . Right lumbar radiculitis 09/30/2014  . Hiatal hernia 09/03/2014  . Hemangioma of liver 08/06/2014  . Exertional chest pain 06/22/2014  . Urinary obstruction 12/08/2013  . Right distal Biceps tendon tear 12/04/2012  . Annual physical exam 03/08/2012  . Erectile dysfunction 10/19/2010  . Allergic rhinitis 10/19/2010  . COLITIS, ULCERATIVE NOS 05/27/2007   Kerin Perna, PTA 07/11/16 12:24 PM  [place], [place], [address] Phone: [phone]   Fax:  [phone]  Name: RENNE ZOUCHA MRN: [id number] Date of Birth: Dec 18, 1965

## 2016-07-18 ENCOUNTER — Ambulatory Visit (INDEPENDENT_AMBULATORY_CARE_PROVIDER_SITE_OTHER): Payer: BLUE CROSS/BLUE SHIELD | Admitting: Physical Therapy

## 2016-07-18 ENCOUNTER — Encounter: Payer: BLUE CROSS/BLUE SHIELD | Admitting: Physical Therapy

## 2016-07-18 DIAGNOSIS — M6281 Muscle weakness (generalized): Secondary | ICD-10-CM | POA: Diagnosis not present

## 2016-07-18 DIAGNOSIS — R29898 Other symptoms and signs involving the musculoskeletal system: Secondary | ICD-10-CM | POA: Diagnosis not present

## 2016-07-18 DIAGNOSIS — M2569 Stiffness of other specified joint, not elsewhere classified: Secondary | ICD-10-CM

## 2016-07-18 DIAGNOSIS — M2560 Stiffness of unspecified joint, not elsewhere classified: Secondary | ICD-10-CM

## 2016-07-18 DIAGNOSIS — M436 Torticollis: Secondary | ICD-10-CM | POA: Diagnosis not present

## 2016-07-18 NOTE — Patient Instructions (Signed)
Arm / Leg Lift: Opposite (Prone)    Lift leg and opposite arm ___2_ inches from floor, keeping knee locked. Repeat __5-10__ times per set. Do __1-2__ sets per session. Do _3___ sessions per week.    Scapular Retraction: Abduction (Prone)    Lie with upper arms straight out from sides, elbows bent to 90. Pinch shoulder blades together and raise arms a few inches from floor. Repeat __5-10__ times per set. Do __2__ sets per session. Do __3__ sessions per week [email or web address]   Child Pose    Sitting on knees, fold body over legs and relax head and arms on floor. Hold for __5 slow __ breaths.   [facility] [place], [place] [address]  [phone] (office) [phone] (fax)'

## 2016-07-18 NOTE — Therapy (Addendum)
[place], [place], [address] Phone: [phone]   Fax:  [phone]  Physical Therapy Treatment  Patient Details  Name: Benjamin Leonard MRN: [id number] Date of Birth: May 06, 1966 Referring Provider: Dian Situ, MD  Encounter Date: 07/18/2016      PT End of Session - 07/18/16 0854    Visit Number 6   Number of Visits 6   Date for PT Re-Evaluation 07/20/16   PT Start Time 0851   PT Stop Time 0922   PT Time Calculation (min) 31 min   Activity Tolerance Patient tolerated treatment well      Past Medical History:  Diagnosis Date  . Borderline hypertension   . Hearing loss    Right ear w/ hearing aid (new one in 2008)  . OAB (overactive bladder)    or BPH?  Marland Kitchen Ulcerative colitis     Past Surgical History:  Procedure Laterality Date  . RHINOPLASTY  1997  . right ear surgery for scar tissue removal  1996    There were no vitals filed for this visit.      Subjective Assessment - 07/18/16 0854    Subjective "Today is a good day".  Main complaint now is constant stiffness/ feeling of soreness.  Pain fluctuates depending on work load.  No new changes since last session.     Currently in Pain? Yes   Pain Score 2    Pain Location Back   Pain Orientation Right;Left   Pain Descriptors / Indicators Dull;Tightness   Aggravating Factors  lifting, twisting, prolonged work positions   Pain Relieving Factors rest, back brace.    Pain Score 3   Pain Location Neck   Pain Orientation Right;Left   Pain Descriptors / Indicators Aching;Sore   Aggravating Factors  unsure    Pain Relieving Factors ice             [facility] PT Assessment - 07/18/16 0001      Assessment   Medical Diagnosis lumbosacral spondylosis, cervical spondylosis with neck shoulder pain.    Referring Provider Dian Situ, MD     Observation/Other Assessments   Focus on Therapeutic Outcomes (FOTO)  26% limited.      AROM    Cervical Flexion 50   Cervical Extension 52   Cervical - Right Side Bend 35   Cervical - Left Side Bend 32   Cervical - Right Rotation 68   Cervical - Left Rotation 62   Lumbar - Right Side Bend 32   Lumbar - Left Side Bend 25          [facility] Adult PT Treatment/Exercise - 07/18/16 0001      Neck Exercises: Prone   W Back 5 reps  2 sets with axial ext   Shoulder Extension 10 reps  with axial ext      Lumbar Exercises: Stretches   Passive Hamstring Stretch 2 reps;30 seconds   Piriformis Stretch 2 reps;30 seconds     Lumbar Exercises: Aerobic   Stationary Bike NuStep (arms and legs) L5: 5 min      Lumbar Exercises: Prone   Opposite Arm/Leg Raise Right arm/Left leg;Left arm/Right leg;5 reps   Other Prone Lumbar Exercises childs pose, then repeated with lateral trunk flexion x 2 reps each side.      Knee/Hip Exercises: Stretches   Other Knee/Hip Stretches ITB stretch in standing x 30 sec each leg.      Discussed current HEP verbally to review  exercises to focus on at home.          PT Long Term Goals - 07/18/16 0857      PT LONG TERM GOAL #1   Title I with advanced HEP (07/20/16)    Time 6   Period Weeks   Status Achieved     PT LONG TERM GOAL #2   Title report 50% improvement in low back pain (07/20/16)    Time 6   Period Weeks   Status Not Met  10% improvement.      PT LONG TERM GOAL #3   Title Pt to report ability to stand at work with 25% decreased pain by end of shift.    Time 6   Period Weeks   Status Not Met  no change in pain level  since initiating therapy.      PT LONG TERM GOAL #4   Title improve FOTO =/< 24% limited (07/20/16)    Time 6   Period Weeks   Status Not Met               Plan - 07/18/16 0924    Clinical Impression Statement Pt tolerated all new exercises well, without increase in symptoms.  Pt has had minimal improvement in symptoms since initiating therapy; has not met LTG 2 and 3. He has partially met his goals and  requests to d/c to HEP at this time.     Rehab Potential Good   PT Frequency 1x / week   PT Duration 6 weeks   PT Treatment/Interventions ADLs/Self Care Home Management;Electrical Stimulation;Cryotherapy;Moist Heat;Ultrasound;Patient/family education;Therapeutic exercise;Therapeutic activities;Manual techniques;Taping;Dry needling   PT Next Visit Plan Spoke to supervising PT; will d/c to HEP at this time.    Consulted and Agree with Plan of Care Patient      Patient will benefit from skilled therapeutic intervention in order to improve the following deficits and impairments:  Decreased activity tolerance, Decreased range of motion, Decreased strength, Postural dysfunction, Impaired flexibility, Pain  Visit Diagnosis: Muscle weakness (generalized)  Neck stiffness  Back stiffness  Other symptoms and signs involving the musculoskeletal system     Problem List Patient Active Problem List   Diagnosis Date Noted  . Right patellofemoral syndrome 04/12/2016  . Hyperlipidemia 01/10/2016  . Degenerative disc disease, cervical 01/10/2016  . Cough 08/05/2015  . Anxiety and depression 02/15/2015  . Left Achilles tendinitis 01/18/2015  . Right lumbar radiculitis 09/30/2014  . Hiatal hernia 09/03/2014  . Hemangioma of liver 08/06/2014  . Exertional chest pain 06/22/2014  . Urinary obstruction 12/08/2013  . Right distal Biceps tendon tear 12/04/2012  . Annual physical exam 03/08/2012  . Erectile dysfunction 10/19/2010  . Allergic rhinitis 10/19/2010  . COLITIS, ULCERATIVE NOS 05/27/2007   Kerin Perna, PTA 07/18/16 9:29 AM  [facility] [place], [place], [address] Phone: [phone]   Fax:  [phone]  Name: Benjamin Leonard MRN: [id number] Date of Birth: Sep 28, 1965  PHYSICAL THERAPY DISCHARGE SUMMARY  Visits from Start of Care: 6  Current functional level related to goals / functional  outcomes: See above   Remaining deficits: As above   Education / Equipment: HEP Plan: Patient agrees to discharge.  Patient goals were partially met. Patient is being discharged due to the patient's request.  ?????He doesn't feel like PT is helping, wanting to have injections    Jeral Pinch, PT 07/25/16 9:24 AM

## 2016-08-07 ENCOUNTER — Ambulatory Visit (INDEPENDENT_AMBULATORY_CARE_PROVIDER_SITE_OTHER): Payer: BLUE CROSS/BLUE SHIELD | Admitting: Osteopathic Medicine

## 2016-08-07 ENCOUNTER — Encounter: Payer: Self-pay | Admitting: Osteopathic Medicine

## 2016-08-07 VITALS — BP 131/83 | HR 78 | Temp 97.8°F | Ht 72.0 in | Wt 197.0 lb

## 2016-08-07 DIAGNOSIS — J069 Acute upper respiratory infection, unspecified: Secondary | ICD-10-CM

## 2016-08-07 DIAGNOSIS — B9789 Other viral agents as the cause of diseases classified elsewhere: Secondary | ICD-10-CM | POA: Diagnosis not present

## 2016-08-07 NOTE — Progress Notes (Signed)
HPI: Benjamin Leonard is a 51 y.o. male who presents to [facility] 08/07/16 for chief complaint of:  Chief Complaint  Patient presents with  . Cough   Acute Illness: . Context:   . Location:  . Quality: aches, tiredness, cough (nonproductive) . Assoc signs/symptoms: see ROS . Duration: 4-5 days . Modifying factors: has tried the following OTC/Rx medications: NyQuil helps at night  Past medical, social and family history reviewed. Current medications and allergies reviewed.    Review of Systems:  Constitutional: No  fever/chills  HEENT: Yes  headache, No  sore throat, No  swollen glands  Cardiovascular: No chest pain  Respiratory:Yes  cough, No  shortness of breath  Gastrointestinal: Mild  nausea, No  vomiting,  No  diarrhea  Musculoskeletal:   No  myalgia/arthralgia  Skin/Integument:  No  rash   Exam:  BP 131/83   Pulse 78   Temp 97.8 F (36.6 C) (Oral)   Ht 6' (1.829 m)   Wt 197 lb (89.4 kg)   BMI 26.72 kg/m   Constitutional: VSS, see above. General Appearance: alert, well-developed, well-nourished, NAD  Eyes: Normal lids and conjunctive, non-icteric sclera, PERRLA  Ears, Nose, Mouth, Throat: Normal external inspection ears/nares/mouth/lips/gums, normal TM, MMM; posterior pharynx without erythema, without exudate, nasal mucosa normal  Neck: No masses, trachea midline. normal lymph nodes  Respiratory: Normal respiratory effort. No  wheeze/rhonchi/rales  Cardiovascular: S1/S2 normal, no murmur/rub/gallop auscultated. RRR.  No results found for this or any previous visit (from the past 72 hour(s)). No results found.   ASSESSMENT/PLAN: CXR if no better, lung exam normal, TC/supportive care, pt declines Rx cough treatment  Viral URI with cough     Patient Instructions  Note: the following list assumes no pregnancy, normal liver & kidney function and no other drug interactions. Dr. Sheppard Coil has highlighted medications  which are safe for you to use, but these may not be appropriate for everyone. Always ask a pharmacist or qualified medical provider if there are any questions!    Aches/Pains, Fever Acetaminophen (Tylenol) 500 mg tablets - take max 2 tablets (1000 mg) every 6 hours (4 times per day)  Ibuprofen (Motrin) 200 mg tablets - take max 4 tablets (800 mg) every 6 hours  Sinus Congestion Prescription Atrovent Cromolyn Nasal Spray (NasalCrom) 1 spray each nostril 3-4 times per day, max 6 imes per day Nasal Saline if desired Oxymetolazone (Afrin, others) sparing use due to rebound congestion Phenylephrine (Sudafed) 10 mg tablets every 4 hours (or the 12-hour formulation) Diphenhydramine (Benadryl) 25 mg tablets - take max 2 tablets every 4 hours  Cough & Sore Throat Prescription cough pills or syrups Dextromethorphan (Robitussin, others) - cough suppressant Guaifenesin (Robitussin, Mucinex, others) - expectorant (helps cough up mucus) (Dextromethorphan and Guaifenesin also come in a combination tablet) Lozenges w/ Benzocaine + Menthol (Cepacol) Honey - as much as you want! Teas which "coat the throat" - look for ingredients Elm Bark, Licorice Root, Marshmallow Root  Other Zinc Lozenges within 24 hours of symptoms onset - mixed evidence this shortens the duration of the common cold Don't waste your money on Vitamin C or Echinacea       Visit summary was printed for the patient with medications and pertinent instructions for patient to review. ER/RTC precautions reviewed. All questions answered. Return if symptoms worsen or fail to improve - call us Friday if no better, sooner if worse .

## 2016-08-07 NOTE — Patient Instructions (Signed)

## 2016-09-17 LAB — HM COLONOSCOPY

## 2016-09-23 ENCOUNTER — Encounter: Payer: Self-pay | Admitting: Sports Medicine

## 2016-10-05 ENCOUNTER — Encounter: Payer: Self-pay | Admitting: Sports Medicine

## 2016-10-05 DIAGNOSIS — K449 Diaphragmatic hernia without obstruction or gangrene: Secondary | ICD-10-CM

## 2016-10-05 DIAGNOSIS — F32A Depression, unspecified: Secondary | ICD-10-CM

## 2016-10-05 DIAGNOSIS — F329 Major depressive disorder, single episode, unspecified: Secondary | ICD-10-CM

## 2016-10-05 DIAGNOSIS — F419 Anxiety disorder, unspecified: Principal | ICD-10-CM

## 2016-10-08 MED ORDER — VILAZODONE HCL 20 MG PO TABS: 1.0000 | ORAL_TABLET | Freq: Every day | ORAL | 1 refills | Status: DC

## 2016-10-12 MED ORDER — ESOMEPRAZOLE MAGNESIUM 40 MG PO CPDR: 40.0000 mg | DELAYED_RELEASE_CAPSULE | Freq: Every day | ORAL | 3 refills | Status: DC

## 2016-10-12 NOTE — Addendum Note (Signed)
Addended by: Silverio Decamp on: 10/12/2016 05:13 PM   Modules accepted: Orders

## 2017-04-21 IMAGING — CR DG ABDOMEN 2V
3 series · 3 of 3 positions shown · non-contrast
Comparison: Lumbar spine series dated September 30, 2014

CLINICAL DATA: Reflux, heart murmur, constipation, assess fecal
burden,

EXAM:
ABDOMEN - 2 VIEW

[abdomen erect]
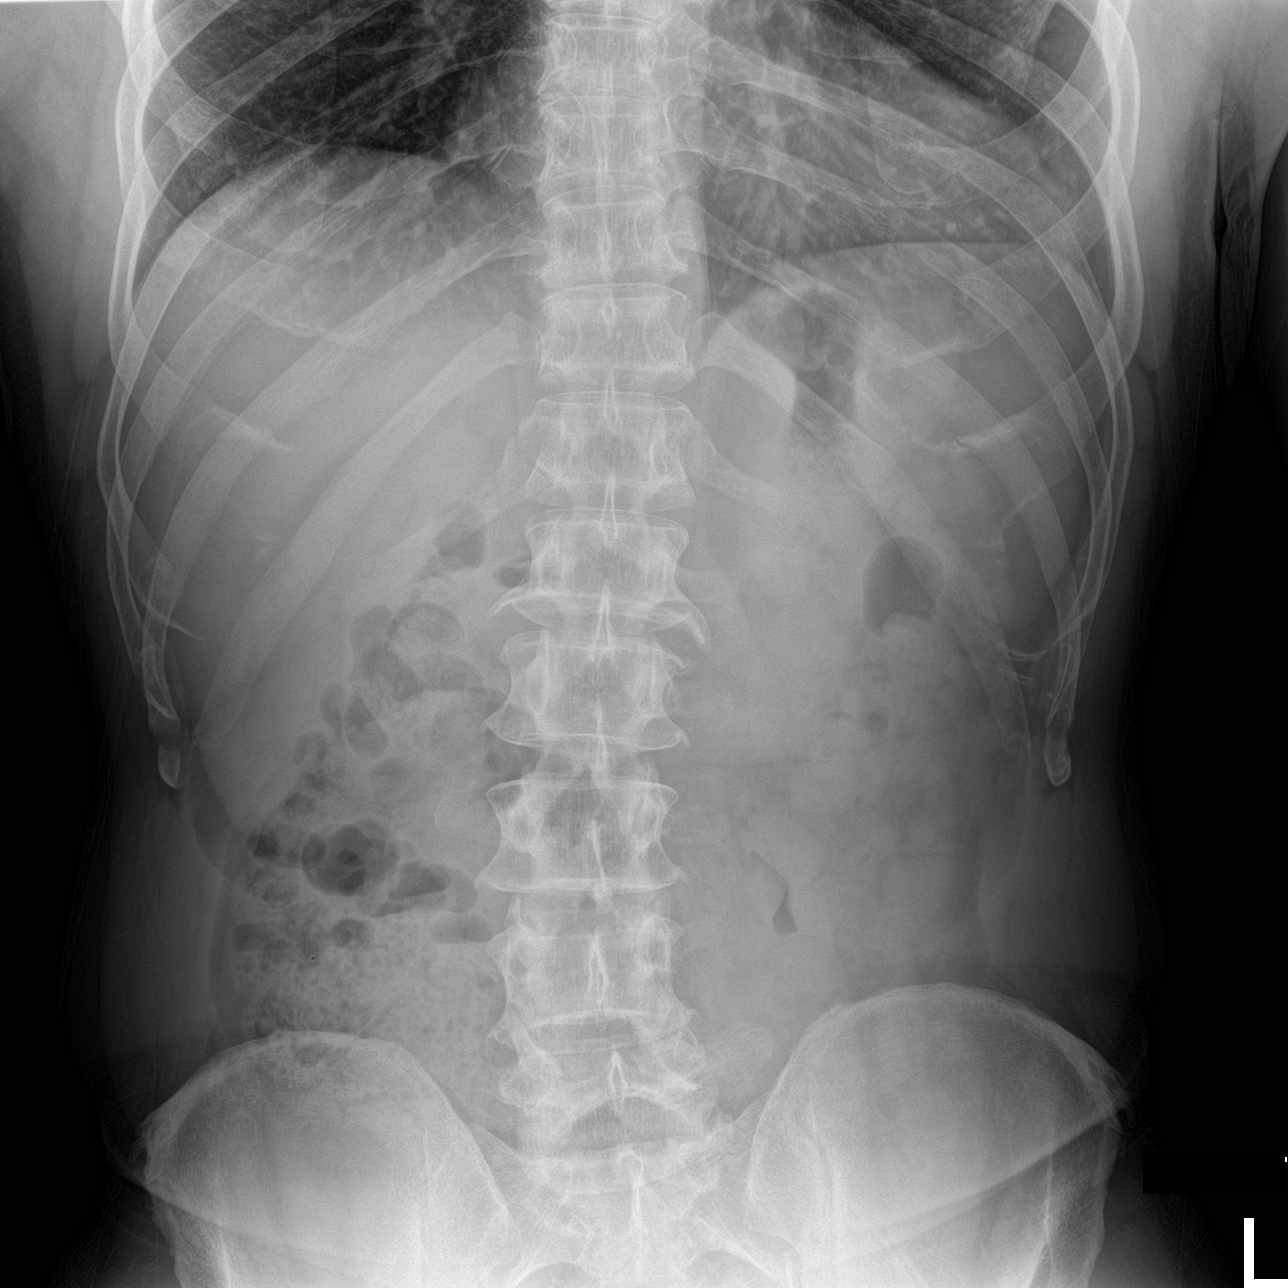

[abdomen supine (1 of 2)]
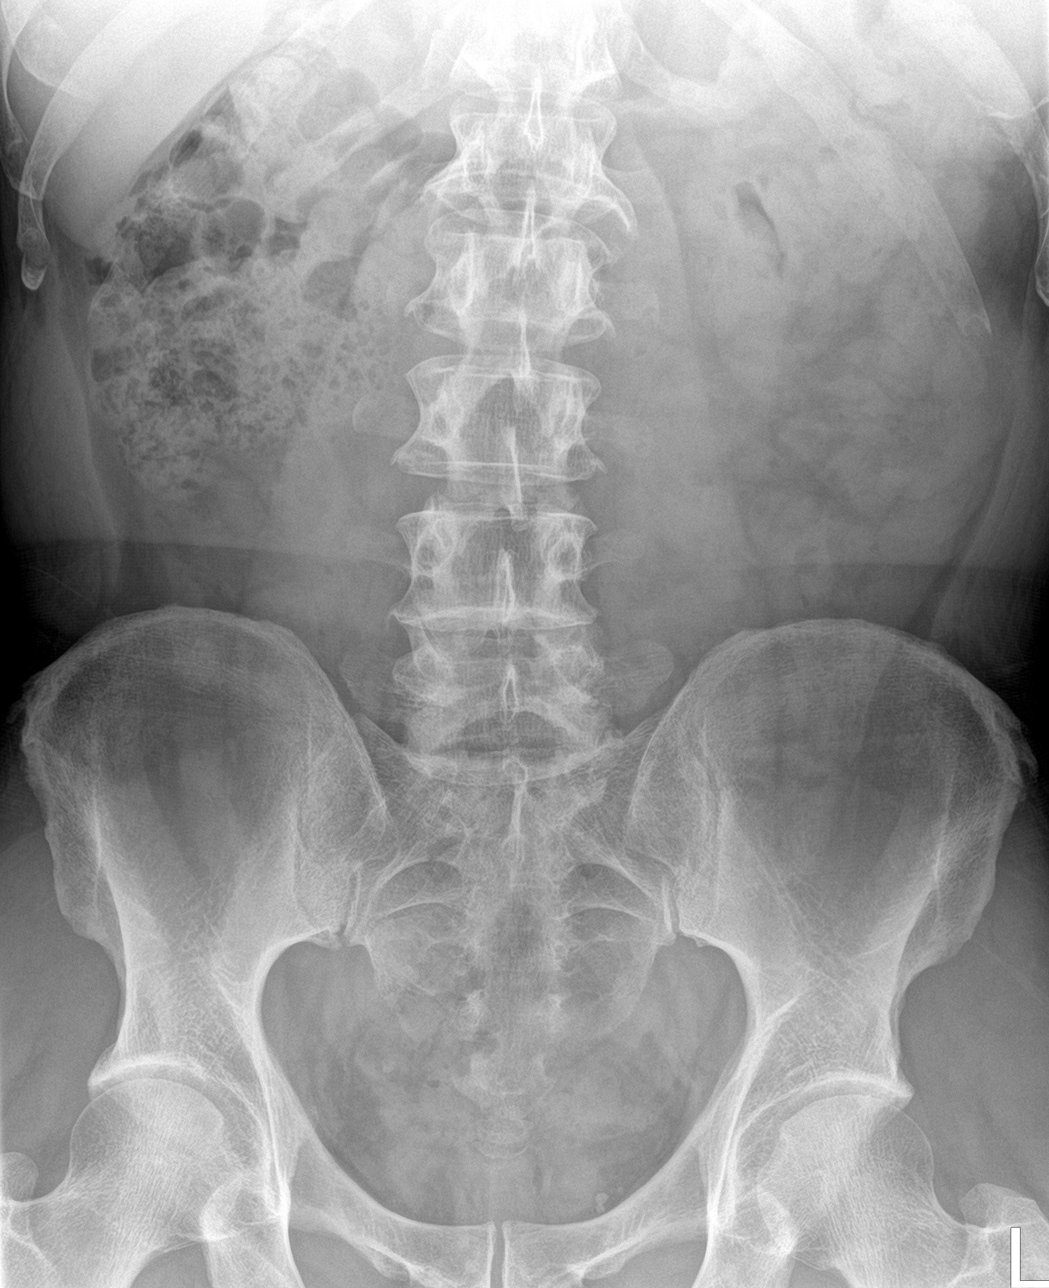

[abdomen supine (2 of 2)]
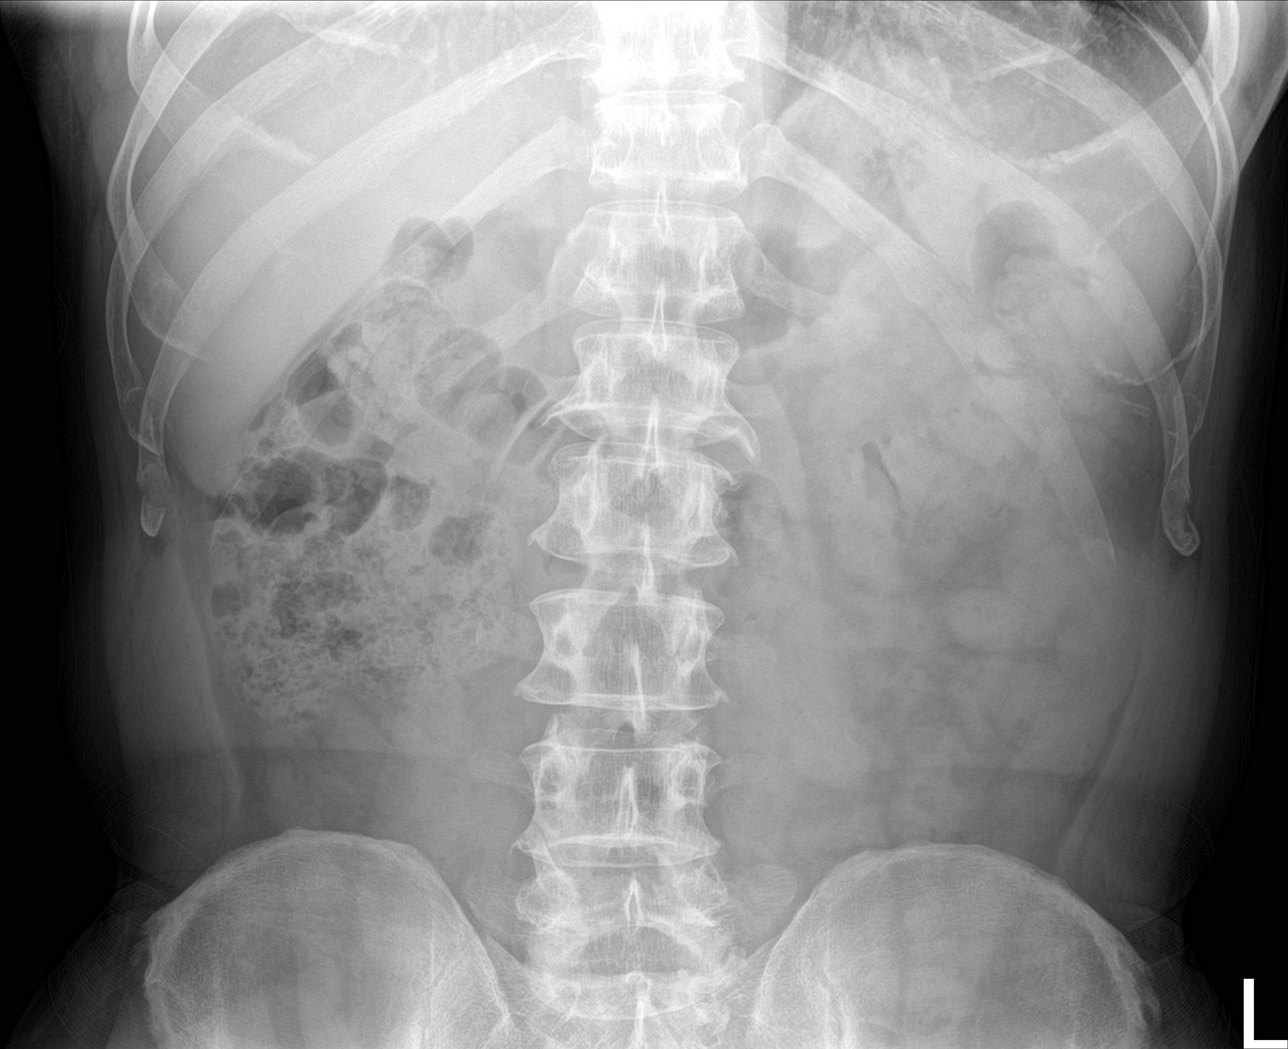

[3 of 3 positions shown; findings below may reference images not displayed]

FINDINGS: The colonic stool burden is increased diffusely. There is no small
or large bowel obstructive pattern. There is no evidence of
perforation. There are mild degenerative changes of the lumbar
spine.
IMPRESSION: Increase colonic stool burden consistent with clinical constipation.

## 2017-04-29 ENCOUNTER — Encounter: Payer: Self-pay | Admitting: Physician Assistant

## 2017-04-29 ENCOUNTER — Ambulatory Visit (INDEPENDENT_AMBULATORY_CARE_PROVIDER_SITE_OTHER): Payer: BLUE CROSS/BLUE SHIELD | Admitting: Physician Assistant

## 2017-04-29 VITALS — BP 130/85 | HR 71 | Temp 97.7°F | Wt 195.0 lb

## 2017-04-29 DIAGNOSIS — J0100 Acute maxillary sinusitis, unspecified: Secondary | ICD-10-CM

## 2017-04-29 DIAGNOSIS — R030 Elevated blood-pressure reading, without diagnosis of hypertension: Secondary | ICD-10-CM | POA: Diagnosis not present

## 2017-04-29 MED ORDER — AZITHROMYCIN 250 MG PO TABS: ORAL_TABLET | ORAL | 0 refills | Status: DC

## 2017-04-29 NOTE — Progress Notes (Deleted)
HPI

## 2017-04-29 NOTE — Progress Notes (Signed)
HPI:                                                                Benjamin Leonard is a 51 y.o. male who presents to [facility] today for sinus congestion  Sinus Problem  This is a new problem. Episode onset: x 8 days. The problem has been gradually worsening since onset. There has been no fever. He is experiencing no pain. Associated symptoms include congestion, coughing and sinus pressure (left-sided). Pertinent negatives include no ear pain, shortness of breath or sore throat. Treatments tried: Nyquil, Dayquil. The treatment provided mild (reports second sickening on Friday) relief.     Past Medical History:  Diagnosis Date  . Borderline hypertension   . Hearing loss    Right ear w/ hearing aid (new one in 2008)  . OAB (overactive bladder)    or BPH?  Marland Kitchen Ulcerative colitis    Past Surgical History:  Procedure Laterality Date  . RHINOPLASTY  1997  . right ear surgery for scar tissue removal  1996   Social History  Substance Use Topics  . Smoking status: Never Smoker  . Smokeless tobacco: Never Used  . Alcohol use 0.0 oz/week     Comment: rare    family history includes Arrhythmia in his brother; Diabetes in his maternal grandfather and maternal grandmother; Hyperlipidemia in his mother; Hypertension in his brother, father, and mother; Sudden death in his father and maternal grandmother.  ROS: negative except as noted in the HPI  Medications: Current Outpatient Prescriptions  Medication Sig Dispense Refill  . esomeprazole (NEXIUM) 40 MG capsule Take 1 capsule (40 mg total) by mouth daily. 90 capsule 3  . mesalamine (LIALDA) 1.2 G EC tablet Take by mouth. Take 4 tablets by mouth daily.    . Multiple Vitamin (MULTIVITAMIN) tablet Take 1 tablet by mouth daily.    . silodosin (RAPAFLO) 8 MG CAPS capsule Take 8 mg by mouth daily with breakfast.    . Vilazodone HCl (VIIBRYD) 20 MG TABS Take 1 tablet (20 mg total) by mouth daily.  90 tablet 1  . azithromycin (ZITHROMAX Z-PAK) 250 MG tablet Take 2 tablets (500 mg) on  Day 1,  followed by 1 tablet (250 mg) once daily on Days 2 through 5. 6 tablet 0   No current facility-administered medications for this visit.    No Known Allergies     Objective:  BP 130/85   Pulse 71   Temp 97.7 F (36.5 C) (Oral)   Wt 195 lb (88.5 kg)   SpO2 99%   BMI 26.45 kg/m  Gen: well-groomed, cooperative, not ill-appearing, no distress HEENT: normal conjunctiva, right TM with scarring, left TM clear, nasal mucosa edematous, oropharynx clear, moist mucus membranes, there is left maxillary sinus tenderness, neck supple, trachea midline Pulm: Normal work of breathing, normal phonation, clear to auscultation bilaterally, no wheezes, rales or rhonchi CV: Normal rate, regular rhythm, s1 and s2 distinct, no murmurs, clicks or rubs  Lymph: no cervical or tonsillar adenopathy Skin: warm, dry, intact; no rashes on exposed skin, no cyanosis  No results found for this or any previous visit (from the past 72 hour(s)). No results found.    Assessment and Plan: 51 y.o. male  with   1. Acute non-recurrent maxillary sinusitis - suspect viral URI that transitioned to bacterial sinusitis given history of second sickening. Patient reports he tolerates Z-pak; does not exacerbate colitis - cont symptomatic management - azithromycin (ZITHROMAX Z-PAK) 250 MG tablet; Take 2 tablets (500 mg) on  Day 1,  followed by 1 tablet (250 mg) once daily on Days 2 through 5.  Dispense: 6 tablet; Refill: 0  Patient education and anticipatory guidance given Patient agrees with treatment plan Follow-up as needed if symptoms worsen or fail to improve  Darlyne Russian PA-C

## 2017-04-30 ENCOUNTER — Encounter: Payer: Self-pay | Admitting: Sports Medicine

## 2017-05-14 ENCOUNTER — Ambulatory Visit (INDEPENDENT_AMBULATORY_CARE_PROVIDER_SITE_OTHER): Payer: BLUE CROSS/BLUE SHIELD | Admitting: Sports Medicine

## 2017-05-14 VITALS — BP 131/71 | HR 74 | Resp 16 | Wt 193.0 lb

## 2017-05-14 DIAGNOSIS — M5416 Radiculopathy, lumbar region: Secondary | ICD-10-CM | POA: Diagnosis not present

## 2017-05-14 DIAGNOSIS — F329 Major depressive disorder, single episode, unspecified: Secondary | ICD-10-CM | POA: Diagnosis not present

## 2017-05-14 DIAGNOSIS — F419 Anxiety disorder, unspecified: Secondary | ICD-10-CM

## 2017-05-14 DIAGNOSIS — Z23 Encounter for immunization: Secondary | ICD-10-CM | POA: Diagnosis not present

## 2017-05-14 DIAGNOSIS — Z0000 Encounter for general adult medical examination without abnormal findings: Secondary | ICD-10-CM

## 2017-05-14 DIAGNOSIS — F32A Depression, unspecified: Secondary | ICD-10-CM

## 2017-05-14 MED ORDER — VILAZODONE HCL 20 MG PO TABS: 1.0000 | ORAL_TABLET | Freq: Every day | ORAL | 3 refills | Status: DC

## 2017-05-14 MED ORDER — PREDNISONE 50 MG PO TABS: ORAL_TABLET | ORAL | 0 refills | Status: DC

## 2017-05-14 NOTE — Assessment & Plan Note (Signed)
Starting Shingrix. He'll return in 2-6 months for the second dose.

## 2017-05-14 NOTE — Assessment & Plan Note (Signed)
Historically right L5, conservative measures did resolve this. After a leg workout he's noted increasing pain running down the back of his right thigh, occasional numbness and healing in the thighs. Present for 5 days, home rehabilitation exercises. Symptoms have been present for 2 weeks, return in 4 weeks, MR no better.

## 2017-05-14 NOTE — Addendum Note (Signed)
Addended by: Elizabeth Sauer on: 05/14/2017 01:57 PM   Modules accepted: Orders

## 2017-05-14 NOTE — Progress Notes (Signed)
  Subjective:    CC: Right leg pain  HPI: This is a pleasant 51 year old male, he has a history of right lumbar radiculitis, after a leg workout 2 weeks ago he started to have increasing pain down the posterior lateral aspect of his right leg, with occasional numbness and tingling in the feet, mild back pain. No bowel or bladder dysfunction, saddle numbness, no constitutional symptoms.  Past medical history:  Negative.  See flowsheet/record as well for more information.  Surgical history: Negative.  See flowsheet/record as well for more information.  Family history: Negative.  See flowsheet/record as well for more information.  Social history: Negative.  See flowsheet/record as well for more information.  Allergies, and medications have been entered into the medical record, reviewed, and no changes needed.   Review of Systems: No fevers, chills, night sweats, weight loss, chest pain, or shortness of breath.   Objective:    General: Well Developed, well nourished, and in no acute distress.  Neuro: Alert and oriented x3, extra-ocular muscles intact, sensation grossly intact.  HEENT: Normocephalic, atraumatic, pupils equal round reactive to light, neck supple, no masses, no lymphadenopathy, thyroid nonpalpable.  Skin: Warm and dry, no rashes. Cardiac: Regular rate and rhythm, no murmurs rubs or gallops, no lower extremity edema.  Respiratory: Clear to auscultation bilaterally. Not using accessory muscles, speaking in full sentences. Back Exam:  Inspection: Unremarkable  Motion: Flexion 45 deg, Extension 45 deg, Side Bending to 45 deg bilaterally,  Rotation to 45 deg bilaterally  SLR laying: Negative  XSLR laying: Negative  Palpable tenderness: None. FABER: negative. Sensory change: Gross sensation intact to all lumbar and sacral dermatomes.  Reflexes: 2+ at both patellar tendons, 2+ at achilles tendons, Babinski's downgoing.  Strength at foot  Plantar-flexion: 5/5 Dorsi-flexion: 5/5  Eversion: 5/5 Inversion: 5/5  Leg strength  Quad: 5/5 Hamstring: 5/5 Hip flexor: 5/5 Hip abductors: 5/5  Gait unremarkable.  Impression and Recommendations:    Right lumbar radiculitis Historically right L5, conservative measures did resolve this. After a leg workout he's noted increasing pain running down the back of his right thigh, occasional numbness and healing in the thighs. Present for 5 days, home rehabilitation exercises. Symptoms have been present for 2 weeks, return in 4 weeks, MR no better.  Annual physical exam Starting Shingrix. He'll return in 2-6 months for the second dose.  ___________________________________________ Gwen Her. Dianah Field, M.D., ABFM., CAQSM. Primary Care and [place] Instructor of [place] of [facility] of Medicine

## 2017-06-11 ENCOUNTER — Ambulatory Visit: Payer: BLUE CROSS/BLUE SHIELD | Admitting: Sports Medicine

## 2017-07-15 ENCOUNTER — Ambulatory Visit: Payer: BLUE CROSS/BLUE SHIELD

## 2017-07-17 ENCOUNTER — Ambulatory Visit (INDEPENDENT_AMBULATORY_CARE_PROVIDER_SITE_OTHER): Payer: BLUE CROSS/BLUE SHIELD | Admitting: Sports Medicine

## 2017-07-17 VITALS — Temp 98.1°F

## 2017-07-17 DIAGNOSIS — Z23 Encounter for immunization: Secondary | ICD-10-CM

## 2017-07-17 NOTE — Progress Notes (Signed)
   Subjective:    Patient ID: Benjamin Leonard, male    DOB: [birth year], 51 y.o.   MRN: [id number]  HPI  [name] is here for last Shingrix vaccine.   Review of Systems     Objective:   Physical Exam        Assessment & Plan:  Shingrix - Patient tolerated injection well without complications.

## 2017-07-18 ENCOUNTER — Other Ambulatory Visit: Payer: Self-pay

## 2017-07-18 DIAGNOSIS — F329 Major depressive disorder, single episode, unspecified: Secondary | ICD-10-CM

## 2017-07-18 DIAGNOSIS — F32A Depression, unspecified: Secondary | ICD-10-CM

## 2017-07-18 DIAGNOSIS — F419 Anxiety disorder, unspecified: Principal | ICD-10-CM

## 2017-07-18 MED ORDER — VILAZODONE HCL 20 MG PO TABS: 1.0000 | ORAL_TABLET | Freq: Every day | ORAL | 3 refills | Status: DC

## 2017-09-13 ENCOUNTER — Ambulatory Visit: Payer: BLUE CROSS/BLUE SHIELD | Admitting: Sports Medicine

## 2017-12-06 ENCOUNTER — Ambulatory Visit (HOSPITAL_BASED_OUTPATIENT_CLINIC_OR_DEPARTMENT_OTHER)
Admission: RE | Admit: 2017-12-06 | Discharge: 2017-12-06 | Disposition: A | Discharge status: Home / Self Care | Payer: BLUE CROSS/BLUE SHIELD | Source: Ambulatory Visit | Attending: Sports Medicine | Admitting: Sports Medicine

## 2017-12-06 ENCOUNTER — Encounter: Payer: Self-pay | Admitting: Sports Medicine

## 2017-12-06 ENCOUNTER — Ambulatory Visit (INDEPENDENT_AMBULATORY_CARE_PROVIDER_SITE_OTHER): Payer: BLUE CROSS/BLUE SHIELD | Admitting: Sports Medicine

## 2017-12-06 DIAGNOSIS — M7989 Other specified soft tissue disorders: Secondary | ICD-10-CM | POA: Diagnosis present

## 2017-12-06 DIAGNOSIS — I82811 Embolism and thrombosis of superficial veins of right lower extremities: Secondary | ICD-10-CM | POA: Insufficient documentation

## 2017-12-06 NOTE — Assessment & Plan Note (Signed)
Right-sided, positive Homans sign, stat DVT ultrasound. Strap with compressive dressing, heel lift. Return in 2 weeks.

## 2017-12-06 NOTE — Progress Notes (Signed)
Subjective:    CC: Calf swelling  HPI: This is a pleasant 52 year old male, he had several calf injuries, more recently he bent down to pick up something, felt a pop and had immediate swelling in his calf, he had pain typically with other Injuries but this time he does not have much pain so he is presented for further evaluation and definitive treatment.  I reviewed the past medical history, family history, social history, surgical history, and allergies today and no changes were needed.  Please see the problem list section below in epic for further details.  Past Medical History: Past Medical History:  Diagnosis Date  . Borderline hypertension   . Hearing loss    Right ear w/ hearing aid (new one in 2008)  . OAB (overactive bladder)    or BPH?  Marland Kitchen Ulcerative colitis    Past Surgical History: Past Surgical History:  Procedure Laterality Date  . RHINOPLASTY  1997  . right ear surgery for scar tissue removal  1996   Social History: Social History   Socioeconomic History  . Marital status: Married    Spouse name: [name]  . Number of children: 2  . Years of education: Not on file  . Highest education level: Not on file  Occupational History  . Occupation: Psychologist, prison and probation services, Passenger transport manager, also works for [employer]  . Financial resource strain: Not on file  . Food insecurity:    Worry: Not on file    Inability: Not on file  . Transportation needs:    Medical: Not on file    Non-medical: Not on file  Tobacco Use  . Smoking status: Never Smoker  . Smokeless tobacco: Never Used  Substance and Sexual Activity  . Alcohol use: Yes    Alcohol/week: 0.0 oz    Comment: rare   . Drug use: No  . Sexual activity: Not on file  Lifestyle  . Physical activity:    Days per week: Not on file    Minutes per session: Not on file  . Stress: Not on file  Relationships  . Social connections:    Talks on phone: Not on file    Gets together: Not on file    Attends religious  service: Not on file    Active member of club or organization: Not on file    Attends meetings of clubs or organizations: Not on file    Relationship status: Not on file  Other Topics Concern  . Not on file  Social History Narrative   Moved from [place] in 2008. Mom lives with them.   Regular exercise:  Works out 3 days a week.   2 children   45 and 7   Daughter 67 is type I diabetic      Mother has platelet disorder (thrombocytosis)      Maternal grand parents - DM II   Family History: Family History  Problem Relation Age of Onset  . Hyperlipidemia Mother   . Hypertension Mother   . Hypertension Father   . Sudden death Father   . Hypertension Brother   . Arrhythmia Brother   . Sudden death Maternal Grandmother   . Diabetes Maternal Grandmother   . Diabetes Maternal Grandfather   . Heart attack Neg Hx    Allergies: No Known Allergies Medications: See med rec.  Review of Systems: No fevers, chills, night sweats, weight loss, chest pain, or shortness of breath.   Objective:    General: Well Developed, well  nourished, and in no acute distress.  Neuro: Alert and oriented x3, extra-ocular muscles intact, sensation grossly intact.  HEENT: Normocephalic, atraumatic, pupils equal round reactive to light, neck supple, no masses, no lymphadenopathy, thyroid nonpalpable.  Skin: Warm and dry, no rashes. Cardiac: Regular rate and rhythm, no murmurs rubs or gallops, no lower extremity edema.  Respiratory: Clear to auscultation bilaterally. Not using accessory muscles, speaking in full sentences. Right calf: Swollen, positive Homans sign, tenderness at the musculotendinous junction of the medial head of the gastrocnemius.  Impression and Recommendations:    Calf swelling Right-sided, positive Homans sign, stat DVT ultrasound. Strap with compressive dressing, heel lift. Return in 2 weeks.  ___________________________________________ Gwen Her. Dianah Field, M.D., ABFM.,  CAQSM. Primary Care and [place] Instructor of [place] of [facility] of Medicine

## 2017-12-27 ENCOUNTER — Ambulatory Visit: Payer: BLUE CROSS/BLUE SHIELD | Admitting: Sports Medicine

## 2017-12-27 DIAGNOSIS — M5416 Radiculopathy, lumbar region: Secondary | ICD-10-CM | POA: Diagnosis not present

## 2017-12-27 DIAGNOSIS — G8929 Other chronic pain: Secondary | ICD-10-CM

## 2017-12-27 DIAGNOSIS — M79671 Pain in right foot: Secondary | ICD-10-CM

## 2017-12-27 NOTE — Progress Notes (Signed)
Subjective:    CC: Right foot pain  HPI: Right foot pain: For the past several weeks this pleasant 52 year old male has had pain that he localizes over the plantar aspect of the base of the right fifth metatarsal, worse with prolonged weightbearing.  Localized without radiation, moderate, persistent.  Right leg swelling, numbness, tingling, weakness, we did discuss this back in 2018, we tried prednisone, rehab exercises, did not really have much improvement.  No bowel or bladder dysfunction, saddle numbness, constitutional symptoms.  I reviewed the past medical history, family history, social history, surgical history, and allergies today and no changes were needed.  Please see the problem list section below in epic for further details.  Past Medical History: Past Medical History:  Diagnosis Date  . Borderline hypertension   . Hearing loss    Right ear w/ hearing aid (new one in 2008)  . OAB (overactive bladder)    or BPH?  Marland Kitchen Ulcerative colitis    Past Surgical History: Past Surgical History:  Procedure Laterality Date  . RHINOPLASTY  1997  . right ear surgery for scar tissue removal  1996   Social History: Social History   Socioeconomic History  . Marital status: Married    Spouse name: [name]  . Number of children: 2  . Years of education: Not on file  . Highest education level: Not on file  Occupational History  . Occupation: Psychologist, prison and probation services, Passenger transport manager, also works for [employer]  . Financial resource strain: Not on file  . Food insecurity:    Worry: Not on file    Inability: Not on file  . Transportation needs:    Medical: Not on file    Non-medical: Not on file  Tobacco Use  . Smoking status: Never Smoker  . Smokeless tobacco: Never Used  Substance and Sexual Activity  . Alcohol use: Yes    Alcohol/week: 0.0 oz    Comment: rare   . Drug use: No  . Sexual activity: Not on file  Lifestyle  . Physical activity:    Days per week: Not on file   Minutes per session: Not on file  . Stress: Not on file  Relationships  . Social connections:    Talks on phone: Not on file    Gets together: Not on file    Attends religious service: Not on file    Active member of club or organization: Not on file    Attends meetings of clubs or organizations: Not on file    Relationship status: Not on file  Other Topics Concern  . Not on file  Social History Narrative   Moved from [place] in 2008. Mom lives with them.   Regular exercise:  Works out 3 days a week.   2 children   72 and 7   Daughter 61 is type I diabetic      Mother has platelet disorder (thrombocytosis)      Maternal grand parents - DM II   Family History: Family History  Problem Relation Age of Onset  . Hyperlipidemia Mother   . Hypertension Mother   . Hypertension Father   . Sudden death Father   . Hypertension Brother   . Arrhythmia Brother   . Sudden death Maternal Grandmother   . Diabetes Maternal Grandmother   . Diabetes Maternal Grandfather   . Heart attack Neg Hx    Allergies: No Known Allergies Medications: See med rec.  Review of Systems: No fevers, chills, night sweats,  weight loss, chest pain, or shortness of breath.   Objective:    General: Well Developed, well nourished, and in no acute distress.  Neuro: Alert and oriented x3, extra-ocular muscles intact, sensation grossly intact.  HEENT: Normocephalic, atraumatic, pupils equal round reactive to light, neck supple, no masses, no lymphadenopathy, thyroid nonpalpable.  Skin: Warm and dry, no rashes. Cardiac: Regular rate and rhythm, no murmurs rubs or gallops, no lower extremity edema.  Respiratory: Clear to auscultation bilaterally. Not using accessory muscles, speaking in full sentences. Right foot: No visible erythema or swelling. Range of motion is full in all directions. Strength is 5/5 in all directions. No hallux valgus. No pes cavus or pes planus. No abnormal callus noted. No pain over the  navicular prominence, or base of fifth metatarsal. No tenderness to palpation of the calcaneal insertion of plantar fascia. No pain at the Achilles insertion. No pain over the calcaneal bursa. No pain of the retrocalcaneal bursa. Tender to palpation over the plantar base of the fifth metatarsal No hallux rigidus or limitus. No tenderness palpation over interphalangeal joints. No pain with compression of the metatarsal heads. Neurovascularly intact distally.  Impression and Recommendations:    Right lumbar radiculitis Persistent right L5 versus S1 symptoms, conservative measures have not resolved this. Typical pain, numbness rating down the back of the thigh, back of the heel. At this point we are going to proceed with an MRI with US imaging.  Chronic foot pain, right Excessive pressure phenomenon at the base of the fifth metatarsal. Lateral wedge placed, we will put a full lateral post if insufficient relief after a couple of weeks. ___________________________________________ Gwen Her. Dianah Field, M.D., ABFM., CAQSM. Primary Care and [place] Instructor of [place] of [facility] of Medicine

## 2017-12-27 NOTE — Assessment & Plan Note (Signed)
Persistent right L5 versus S1 symptoms, conservative measures have not resolved this. Typical pain, numbness rating down the back of the thigh, back of the heel. At this point we are going to proceed with an MRI with US imaging.

## 2017-12-27 NOTE — Assessment & Plan Note (Signed)
Excessive pressure phenomenon at the base of the fifth metatarsal. Lateral wedge placed, we will put a full lateral post if insufficient relief after a couple of weeks.

## 2018-01-10 ENCOUNTER — Ambulatory Visit (INDEPENDENT_AMBULATORY_CARE_PROVIDER_SITE_OTHER): Payer: BLUE CROSS/BLUE SHIELD | Admitting: Sports Medicine

## 2018-01-10 ENCOUNTER — Encounter: Payer: Self-pay | Admitting: Sports Medicine

## 2018-01-10 DIAGNOSIS — Z0000 Encounter for general adult medical examination without abnormal findings: Secondary | ICD-10-CM | POA: Diagnosis not present

## 2018-01-10 DIAGNOSIS — M5416 Radiculopathy, lumbar region: Secondary | ICD-10-CM | POA: Diagnosis not present

## 2018-01-10 NOTE — Progress Notes (Signed)
Subjective:    CC: Recheck MRI  HPI: Benjamin Leonard returns, he is a pleasant 52 year old male with lumbar spinal stenosis and right-sided radicular symptoms, he has more paresthesias down the right leg but pain in his back.  He failed conservative measures so we proceeded with an MRI.  I reviewed the past medical history, family history, social history, surgical history, and allergies today and no changes were needed.  Please see the problem list section below in epic for further details.  Past Medical History: Past Medical History:  Diagnosis Date  . Borderline hypertension   . Hearing loss    Right ear w/ hearing aid (new one in 2008)  . OAB (overactive bladder)    or BPH?  Marland Kitchen Ulcerative colitis    Past Surgical History: Past Surgical History:  Procedure Laterality Date  . RHINOPLASTY  1997  . right ear surgery for scar tissue removal  1996   Social History: Social History   Socioeconomic History  . Marital status: Married    Spouse name: [name]  . Number of children: 2  . Years of education: Not on file  . Highest education level: Not on file  Occupational History  . Occupation: Psychologist, prison and probation services, Passenger transport manager, also works for [employer]  . Financial resource strain: Not on file  . Food insecurity:    Worry: Not on file    Inability: Not on file  . Transportation needs:    Medical: Not on file    Non-medical: Not on file  Tobacco Use  . Smoking status: Never Smoker  . Smokeless tobacco: Never Used  Substance and Sexual Activity  . Alcohol use: Yes    Alcohol/week: 0.0 oz    Comment: rare   . Drug use: No  . Sexual activity: Not on file  Lifestyle  . Physical activity:    Days per week: Not on file    Minutes per session: Not on file  . Stress: Not on file  Relationships  . Social connections:    Talks on phone: Not on file    Gets together: Not on file    Attends religious service: Not on file    Active member of club or organization: Not on file   Attends meetings of clubs or organizations: Not on file    Relationship status: Not on file  Other Topics Concern  . Not on file  Social History Narrative   Moved from [place] in 2008. Mom lives with them.   Regular exercise:  Works out 3 days a week.   2 children   68 and 7   Daughter 1 is type I diabetic      Mother has platelet disorder (thrombocytosis)      Maternal grand parents - DM II   Family History: Family History  Problem Relation Age of Onset  . Hyperlipidemia Mother   . Hypertension Mother   . Hypertension Father   . Sudden death Father   . Hypertension Brother   . Arrhythmia Brother   . Sudden death Maternal Grandmother   . Diabetes Maternal Grandmother   . Diabetes Maternal Grandfather   . Heart attack Neg Hx    Allergies: No Known Allergies Medications: See med rec.  Review of Systems: No fevers, chills, night sweats, weight loss, chest pain, or shortness of breath.   Objective:    General: Well Developed, well nourished, and in no acute distress.  Neuro: Alert and oriented x3, extra-ocular muscles intact, sensation grossly intact.  HEENT: Normocephalic, atraumatic, pupils equal round reactive to light, neck supple, no masses, no lymphadenopathy, thyroid nonpalpable.  Skin: Warm and dry, no rashes. Cardiac: Regular rate and rhythm, no murmurs rubs or gallops, no lower extremity edema.  Respiratory: Clear to auscultation bilaterally. Not using accessory muscles, speaking in full sentences.  MRI personally reviewed, he does have degenerative disc disease with endplate reactive changes at L1-L2, as well as at L5-S1 but the worst level is L4-L5 with severe spinal stenosis from disc protrusion, facet and ligamentum flavum hypertrophy.  Desiccation at the above levels as well.  Mild facet arthritis at multiple levels in the lower lumbar spine.  Impression and Recommendations:    Right lumbar radiculitis Severe spinal stenosis at L4-L5, lesser so at other  levels. We are going to proceed with a bilateral L4-L5 transforaminal epidural. Pain is axial, discogenic.  Does have some right-sided radicular symptoms down to the ankle and foot. Return to see me 1 month after epidural, if partial relief we will stack epidurals #2 and #3, if no relief we will refer to neurosurgery.  Annual physical exam Ordering routine labs.  I spent 25 minutes with this patient, greater than 50% was face-to-face time counseling regarding the above diagnoses ___________________________________________ Benjamin Leonard, M.D., ABFM., CAQSM. Primary Care and [place] Instructor of [place] of [facility] of Medicine

## 2018-01-10 NOTE — Assessment & Plan Note (Signed)
Severe spinal stenosis at L4-L5, lesser so at other levels. We are going to proceed with a bilateral L4-L5 transforaminal epidural. Pain is axial, discogenic.  Does have some right-sided radicular symptoms down to the ankle and foot. Return to see me 1 month after epidural, if partial relief we will stack epidurals #2 and #3, if no relief we will refer to neurosurgery.

## 2018-01-10 NOTE — Assessment & Plan Note (Signed)
Ordering routine labs.

## 2018-01-11 LAB — COMPREHENSIVE METABOLIC PANEL
AG Ratio: 1.8 (calc) (ref 1.0–2.5)
ALT: 22 U/L (ref 9–46)
AST: 21 U/L (ref 10–35)
Alkaline phosphatase (APISO): 78 U/L (ref 40–115)
CO2: 28 mmol/L (ref 20–32)
Calcium: 9.6 mg/dL (ref 8.6–10.3)
Chloride: 104 mmol/L (ref 98–110)
Creat: 1.12 mg/dL (ref 0.70–1.33)
Glucose, Bld: 91 mg/dL (ref 65–99)
Total Bilirubin: 0.8 mg/dL (ref 0.2–1.2)

## 2018-01-11 LAB — LIPID PANEL W/REFLEX DIRECT LDL
Cholesterol: 220 mg/dL — ABNORMAL HIGH (ref <200)
HDL: 51 mg/dL (ref >40)
LDL Cholesterol (Calc): 141 mg/dL — ABNORMAL HIGH
Non-HDL Cholesterol (Calc): 169 mg/dL — ABNORMAL HIGH (ref <130)
Total CHOL/HDL Ratio: 4.3 (calc) (ref <5.0)
Triglycerides: 146 mg/dL (ref <150)

## 2018-01-11 LAB — COMPREHENSIVE METABOLIC PANEL WITH GFR
Albumin: 4.5 g/dL (ref 3.6–5.1)
BUN: 16 mg/dL (ref 7–25)
Globulin: 2.5 g/dL (ref 1.9–3.7)
Potassium: 4.2 mmol/L (ref 3.5–5.3)
Sodium: 139 mmol/L (ref 135–146)
Total Protein: 7 g/dL (ref 6.1–8.1)

## 2018-01-11 LAB — CBC
HCT: 43.2 % (ref 38.5–50.0)
Hemoglobin: 15.2 g/dL (ref 13.2–17.1)
MCH: 30.8 pg (ref 27.0–33.0)
MCHC: 35.2 g/dL (ref 32.0–36.0)
MCV: 87.4 fL (ref 80.0–100.0)
MPV: 9.1 fL (ref 7.5–12.5)
Platelets: 254 10*3/uL (ref 140–400)
RBC: 4.94 10*6/uL (ref 4.20–5.80)
RDW: 12.6 % (ref 11.0–15.0)
WBC: 4.2 Thousand/uL (ref 3.8–10.8)

## 2018-01-11 LAB — HEPATITIS C ANTIBODY
Hepatitis C Ab: NONREACTIVE
SIGNAL TO CUT-OFF: 0.03 (ref <1.00)

## 2018-01-11 LAB — HEMOGLOBIN A1C
Hgb A1c MFr Bld: 5.2 %{Hb} (ref <5.7)
Mean Plasma Glucose: 103 (calc)
eAG (mmol/L): 5.7 (calc)

## 2018-01-11 LAB — TSH: TSH: 1.91 mIU/L (ref 0.40–4.50)

## 2018-01-11 LAB — HIV ANTIBODY (ROUTINE TESTING W REFLEX): HIV 1&2 Ab, 4th Generation: NONREACTIVE

## 2018-01-21 ENCOUNTER — Other Ambulatory Visit: Payer: Self-pay | Admitting: Sports Medicine

## 2018-01-21 ENCOUNTER — Ambulatory Visit
Admission: RE | Admit: 2018-01-21 | Discharge: 2018-01-21 | Disposition: A | Discharge status: Home / Self Care | Payer: BLUE CROSS/BLUE SHIELD | Source: Ambulatory Visit | Attending: Sports Medicine | Admitting: Sports Medicine

## 2018-01-21 DIAGNOSIS — M5416 Radiculopathy, lumbar region: Secondary | ICD-10-CM

## 2018-01-21 MED ORDER — IOPAMIDOL (ISOVUE-M 200) INJECTION 41%
1.0000 mL | Freq: Once | INTRAMUSCULAR | Status: AC
  Administered 2018-01-21: 1 mL via EPIDURAL

## 2018-01-21 MED ORDER — METHYLPREDNISOLONE ACETATE 40 MG/ML INJ SUSP (RADIOLOG
120.0000 mg | Freq: Once | INTRAMUSCULAR | Status: AC
  Administered 2018-01-21: 120 mg via EPIDURAL

## 2018-01-21 NOTE — Discharge Instructions (Signed)

## 2018-02-19 ENCOUNTER — Ambulatory Visit: Payer: BLUE CROSS/BLUE SHIELD | Admitting: Sports Medicine

## 2018-02-20 ENCOUNTER — Ambulatory Visit: Payer: BLUE CROSS/BLUE SHIELD | Admitting: Sports Medicine

## 2018-02-20 ENCOUNTER — Encounter: Payer: Self-pay | Admitting: Sports Medicine

## 2018-02-20 DIAGNOSIS — F419 Anxiety disorder, unspecified: Secondary | ICD-10-CM | POA: Diagnosis not present

## 2018-02-20 DIAGNOSIS — K449 Diaphragmatic hernia without obstruction or gangrene: Secondary | ICD-10-CM

## 2018-02-20 DIAGNOSIS — M5030 Other cervical disc degeneration, unspecified cervical region: Secondary | ICD-10-CM

## 2018-02-20 DIAGNOSIS — M5416 Radiculopathy, lumbar region: Secondary | ICD-10-CM | POA: Diagnosis not present

## 2018-02-20 DIAGNOSIS — F32A Depression, unspecified: Secondary | ICD-10-CM

## 2018-02-20 DIAGNOSIS — F329 Major depressive disorder, single episode, unspecified: Secondary | ICD-10-CM

## 2018-02-20 MED ORDER — ESOMEPRAZOLE MAGNESIUM 40 MG PO CPDR: 40.0000 mg | DELAYED_RELEASE_CAPSULE | Freq: Every day | ORAL | 3 refills | Status: DC

## 2018-02-20 MED ORDER — VILAZODONE HCL 20 MG PO TABS: 1.0000 | ORAL_TABLET | Freq: Every day | ORAL | 3 refills | Status: DC

## 2018-02-20 NOTE — Progress Notes (Signed)
Subjective:    CC: Neck pain  HPI: Alphons is a pleasant 52 year old male, he has severe lumbar spinal stenosis at L4-L5.  At the last visit we ordered bilateral L4-L5 transforaminal epidurals, he is 1 month post injection and reports 100% relief in his symptoms, very happy with how things have gone.  He does have known cervical degenerative disc disease, C5-C6, more recently having increased pain with fasciculations in his trapezius and over his scapula.  Nothing overtly radicular down to the hand or fingertips, no progressive weakness, trauma, constitutional symptoms.  I reviewed the past medical history, family history, social history, surgical history, and allergies today and no changes were needed.  Please see the problem list section below in epic for further details.  Past Medical History: Past Medical History:  Diagnosis Date  . Borderline hypertension   . Hearing loss    Right ear w/ hearing aid (new one in 2008)  . OAB (overactive bladder)    or BPH?  Marland Kitchen Ulcerative colitis    Past Surgical History: Past Surgical History:  Procedure Laterality Date  . RHINOPLASTY  1997  . right ear surgery for scar tissue removal  1996   Social History: Social History   Socioeconomic History  . Marital status: Married    Spouse name: [name]  . Number of children: 2  . Years of education: Not on file  . Highest education level: Not on file  Occupational History  . Occupation: Psychologist, prison and probation services, Passenger transport manager, also works for [employer]  . Financial resource strain: Not on file  . Food insecurity:    Worry: Not on file    Inability: Not on file  . Transportation needs:    Medical: Not on file    Non-medical: Not on file  Tobacco Use  . Smoking status: Never Smoker  . Smokeless tobacco: Never Used  Substance and Sexual Activity  . Alcohol use: Yes    Alcohol/week: 0.0 oz    Comment: rare   . Drug use: No  . Sexual activity: Not on file  Lifestyle  . Physical activity:      Days per week: Not on file    Minutes per session: Not on file  . Stress: Not on file  Relationships  . Social connections:    Talks on phone: Not on file    Gets together: Not on file    Attends religious service: Not on file    Active member of club or organization: Not on file    Attends meetings of clubs or organizations: Not on file    Relationship status: Not on file  Other Topics Concern  . Not on file  Social History Narrative   Moved from [place] in 2008. Mom lives with them.   Regular exercise:  Works out 3 days a week.   2 children   31 and 7   Daughter 3 is type I diabetic      Mother has platelet disorder (thrombocytosis)      Maternal grand parents - DM II   Family History: Family History  Problem Relation Age of Onset  . Hyperlipidemia Mother   . Hypertension Mother   . Hypertension Father   . Sudden death Father   . Hypertension Brother   . Arrhythmia Brother   . Sudden death Maternal Grandmother   . Diabetes Maternal Grandmother   . Diabetes Maternal Grandfather   . Heart attack Neg Hx    Allergies: No Known Allergies Medications:  See med rec.  Review of Systems: No fevers, chills, night sweats, weight loss, chest pain, or shortness of breath.   Objective:    General: Well Developed, well nourished, and in no acute distress.  Neuro: Alert and oriented x3, extra-ocular muscles intact, sensation grossly intact.  HEENT: Normocephalic, atraumatic, pupils equal round reactive to light, neck supple, no masses, no lymphadenopathy, thyroid nonpalpable.  Skin: Warm and dry, no rashes. Cardiac: Regular rate and rhythm, no murmurs rubs or gallops, no lower extremity edema.  Respiratory: Clear to auscultation bilaterally. Not using accessory muscles, speaking in full sentences. Neck: Negative spurling's Ender to palpation over the trapezius and levator scapulae bilaterally. Full neck range of motion Grip strength and sensation normal in bilateral  hands Strength good C4 to T1 distribution No sensory change to C4 to T1 Reflexes normal  Procedure:  Injection of left and right trapezial and levator scapulae trigger points, total #4. Consent obtained and verified. Time-out conducted. Noted no overlying erythema, induration, or other signs of local infection. Skin prepped in a sterile fashion. Topical analgesic spray: Ethyl chloride. Completed without difficulty. Meds: A total of 2 cc kenalog 40, 2 cc lidocaine, 2 cc bupivacaine spread out in a fanlike pattern between the 4 trigger points. Pain immediately improved suggesting accurate placement of the medication. Advised to call if fevers/chills, erythema, induration, drainage, or persistent bleeding.  Impression and Recommendations:    Degenerative disc disease, cervical Occasional right-sided radiculopathy, also has bilateral trapezial and periscapular pain. He did have an epidural sometime ago with Dr. Vira Blanco without much relief. We are going to try #4 trigger point injections today. Return to see me in a month to go over relief. If insufficient relief we will proceed with a cervical spine MRI for epidural planning again.  Right lumbar radiculitis Severe central canal stenosis at L4-L5, he had bilateral L4-L5 transforaminal epidurals and returns 100% pain-free.  ___________________________________________ Gwen Her. Dianah Field, M.D., ABFM., CAQSM. Primary Care and [place] Instructor of [place] of [facility] of Medicine

## 2018-02-20 NOTE — Assessment & Plan Note (Signed)
Occasional right-sided radiculopathy, also has bilateral trapezial and periscapular pain. He did have an epidural sometime ago with Dr. Vira Blanco without much relief. We are going to try #4 trigger point injections today. Return to see me in a month to go over relief. If insufficient relief we will proceed with a cervical spine MRI for epidural planning again.

## 2018-02-20 NOTE — Assessment & Plan Note (Signed)
Severe central canal stenosis at L4-L5, he had bilateral L4-L5 transforaminal epidurals and returns 100% pain-free.

## 2018-03-10 ENCOUNTER — Encounter: Payer: Self-pay | Admitting: Sports Medicine

## 2018-03-10 ENCOUNTER — Ambulatory Visit (INDEPENDENT_AMBULATORY_CARE_PROVIDER_SITE_OTHER): Payer: BLUE CROSS/BLUE SHIELD | Admitting: Sports Medicine

## 2018-03-10 DIAGNOSIS — L739 Follicular disorder, unspecified: Secondary | ICD-10-CM | POA: Diagnosis not present

## 2018-03-10 MED ORDER — CHLORHEXIDINE GLUCONATE 4 % EX LIQD: Freq: Every day | CUTANEOUS | 0 refills | Status: AC

## 2018-03-10 MED ORDER — MUPIROCIN 2 % EX OINT: TOPICAL_OINTMENT | CUTANEOUS | 3 refills | Status: DC

## 2018-03-10 MED ORDER — DOXYCYCLINE HYCLATE 100 MG PO TABS: 100.0000 mg | ORAL_TABLET | Freq: Two times a day (BID) | ORAL | 0 refills | Status: AC

## 2018-03-10 NOTE — Progress Notes (Signed)
Subjective:    CC: Skin rash  HPI: For the past week or so this pleasant 52 year old male with a history of IBD has had an increasing reddish rash on his buttocks, leg, arms.  He also feels generalized malaise, no fevers or chills though.  Symptoms are moderate, persistent.  I reviewed the past medical history, family history, social history, surgical history, and allergies today and no changes were needed.  Please see the problem list section below in epic for further details.  Past Medical History: Past Medical History:  Diagnosis Date  . Borderline hypertension   . Hearing loss    Right ear w/ hearing aid (new one in 2008)  . OAB (overactive bladder)    or BPH?  Marland Kitchen Ulcerative colitis    Past Surgical History: Past Surgical History:  Procedure Laterality Date  . RHINOPLASTY  1997  . right ear surgery for scar tissue removal  1996   Social History: Social History   Socioeconomic History  . Marital status: Married    Spouse name: [name]  . Number of children: 2  . Years of education: Not on file  . Highest education level: Not on file  Occupational History  . Occupation: Psychologist, prison and probation services, Passenger transport manager, also works for [employer]  . Financial resource strain: Not on file  . Food insecurity:    Worry: Not on file    Inability: Not on file  . Transportation needs:    Medical: Not on file    Non-medical: Not on file  Tobacco Use  . Smoking status: Never Smoker  . Smokeless tobacco: Never Used  Substance and Sexual Activity  . Alcohol use: Yes    Alcohol/week: 0.0 standard drinks    Comment: rare   . Drug use: No  . Sexual activity: Not on file  Lifestyle  . Physical activity:    Days per week: Not on file    Minutes per session: Not on file  . Stress: Not on file  Relationships  . Social connections:    Talks on phone: Not on file    Gets together: Not on file    Attends religious service: Not on file    Active member of club or organization: Not on file      Attends meetings of clubs or organizations: Not on file    Relationship status: Not on file  Other Topics Concern  . Not on file  Social History Narrative   Moved from [place] in 2008. Mom lives with them.   Regular exercise:  Works out 3 days a week.   2 children   77 and 7   Daughter 34 is type I diabetic      Mother has platelet disorder (thrombocytosis)      Maternal grand parents - DM II   Family History: Family History  Problem Relation Age of Onset  . Hyperlipidemia Mother   . Hypertension Mother   . Hypertension Father   . Sudden death Father   . Hypertension Brother   . Arrhythmia Brother   . Sudden death Maternal Grandmother   . Diabetes Maternal Grandmother   . Diabetes Maternal Grandfather   . Heart attack Neg Hx    Allergies: No Known Allergies Medications: See med rec.  Review of Systems: No fevers, chills, night sweats, weight loss, chest pain, or shortness of breath.   Objective:    General: Well Developed, well nourished, and in no acute distress.  Neuro: Alert and oriented x3,  extra-ocular muscles intact, sensation grossly intact.  HEENT: Normocephalic, atraumatic, pupils equal round reactive to light, neck supple, no masses, no lymphadenopathy, thyroid nonpalpable.  Skin: Warm and dry, there is a papulopustular rash over the buttocks, thighs, there is one lesion on his left cheek on his face, arms.  Nothing in the axillae. Cardiac: Regular rate and rhythm, no murmurs rubs or gallops, no lower extremity edema.  Respiratory: Clear to auscultation bilaterally. Not using accessory muscles, speaking in full sentences.  Impression and Recommendations:    Folliculitis Recurrence of folliculitis, we will treat aggressively with topical mupirocin, doxycycline, Hibiclens baths. Because of his systemic symptoms we are going to also check CBC, CMP, blood cultures. This does not appear to be pyoderma gangrenosum, BADAS syndrome, or Sweet syndrome which can be  associated with inflammatory bowel disease. However if there is no improvement over the next week or 2 we will add high-dose steroids.  I spent 25 minutes with this patient, greater than 50% was face-to-face time counseling regarding the above diagnoses ___________________________________________ Gwen Her. Dianah Field, M.D., ABFM., CAQSM. Primary Care and [place] Instructor of [place] of [facility] of Medicine

## 2018-03-10 NOTE — Assessment & Plan Note (Addendum)
Recurrence of folliculitis, we will treat aggressively with topical mupirocin, doxycycline, Hibiclens baths. Because of his systemic symptoms we are going to also check CBC, CMP, blood cultures. This does not appear to be pyoderma gangrenosum, BADAS syndrome, or Sweet syndrome which can be associated with inflammatory bowel disease. However if there is no improvement over the next week or 2 we will add high-dose steroids.

## 2018-03-10 NOTE — Patient Instructions (Signed)

## 2018-03-12 ENCOUNTER — Encounter: Payer: Self-pay | Admitting: Sports Medicine

## 2018-03-17 ENCOUNTER — Encounter: Payer: Self-pay | Admitting: Sports Medicine

## 2018-03-17 LAB — CULTURE, BLOOD (SINGLE)
MICRO NUMBER:: 90953945
Result:: NO GROWTH
SPECIMEN QUALITY:: ADEQUATE

## 2018-03-17 LAB — CBC WITH DIFFERENTIAL/PLATELET
Basophils Absolute: 19 {cells}/uL (ref 0–200)
Basophils Relative: 0.4 %
Eosinophils Absolute: 29 {cells}/uL (ref 15–500)
Eosinophils Relative: 0.6 %
HCT: 42.1 % (ref 38.5–50.0)
Hemoglobin: 14.6 g/dL (ref 13.2–17.1)
Lymphs Abs: 1042 {cells}/uL (ref 850–3900)
MCH: 31.2 pg (ref 27.0–33.0)
MCHC: 34.7 g/dL (ref 32.0–36.0)
MCV: 90 fL (ref 80.0–100.0)
MPV: 8.9 fL (ref 7.5–12.5)
Monocytes Relative: 8.1 %
Neutro Abs: 3322 {cells}/uL (ref 1500–7800)
Neutrophils Relative %: 69.2 %
Platelets: 282 10*3/uL (ref 140–400)
RBC: 4.68 Million/uL (ref 4.20–5.80)
RDW: 12.7 % (ref 11.0–15.0)
Total Lymphocyte: 21.7 %
WBC mixed population: 389 {cells}/uL (ref 200–950)
WBC: 4.8 10*3/uL (ref 3.8–10.8)

## 2018-03-17 LAB — COMPREHENSIVE METABOLIC PANEL WITH GFR
AG Ratio: 1.6 (calc) (ref 1.0–2.5)
ALT: 19 U/L (ref 9–46)
AST: 21 U/L (ref 10–35)
Albumin: 4.2 g/dL (ref 3.6–5.1)
Alkaline phosphatase (APISO): 69 U/L (ref 40–115)
BUN: 16 mg/dL (ref 7–25)
CO2: 29 mmol/L (ref 20–32)
Calcium: 9.3 mg/dL (ref 8.6–10.3)
Chloride: 105 mmol/L (ref 98–110)
Creat: 1.09 mg/dL (ref 0.70–1.33)
Globulin: 2.7 g/dL (ref 1.9–3.7)
Glucose, Bld: 105 mg/dL — ABNORMAL HIGH (ref 65–99)
Potassium: 4.1 mmol/L (ref 3.5–5.3)
Sodium: 141 mmol/L (ref 135–146)
Total Bilirubin: 0.9 mg/dL (ref 0.2–1.2)
Total Protein: 6.9 g/dL (ref 6.1–8.1)

## 2018-03-17 MED ORDER — FLUCONAZOLE 200 MG PO TABS: 200.0000 mg | ORAL_TABLET | ORAL | 0 refills | Status: DC

## 2018-03-24 ENCOUNTER — Encounter: Payer: Self-pay | Admitting: Sports Medicine

## 2018-03-24 ENCOUNTER — Ambulatory Visit: Payer: BLUE CROSS/BLUE SHIELD | Admitting: Sports Medicine

## 2018-03-24 DIAGNOSIS — M5416 Radiculopathy, lumbar region: Secondary | ICD-10-CM

## 2018-03-24 DIAGNOSIS — M5030 Other cervical disc degeneration, unspecified cervical region: Secondary | ICD-10-CM | POA: Diagnosis not present

## 2018-03-24 DIAGNOSIS — L739 Follicular disorder, unspecified: Secondary | ICD-10-CM

## 2018-03-24 NOTE — Progress Notes (Signed)
Subjective:    CC: Neck pain, skin rash  HPI: Benjamin Leonard returns, a month ago we did some trigger point injections, he did well but still has some discomfort around his right trapezius and levator scapulae.  In addition he has had a folliculitis-like rash, this is persisted in spite of topical mupirocin, Hibiclens baths, oral doxycycline.  He also did a course of fluconazole high-dose.  More recently he has seen his dermatologist, switched to Septra, topical clindamycin.  Rash is persistent but he does not have any actual discomfort.  I reviewed the past medical history, family history, social history, surgical history, and allergies today and no changes were needed.  Please see the problem list section below in epic for further details.  Past Medical History: Past Medical History:  Diagnosis Date  . Borderline hypertension   . Hearing loss    Right ear w/ hearing aid (new one in 2008)  . OAB (overactive bladder)    or BPH?  Marland Kitchen Ulcerative colitis    Past Surgical History: Past Surgical History:  Procedure Laterality Date  . RHINOPLASTY  1997  . right ear surgery for scar tissue removal  1996   Social History: Social History   Socioeconomic History  . Marital status: Married    Spouse name: [name]  . Number of children: 2  . Years of education: Not on file  . Highest education level: Not on file  Occupational History  . Occupation: Psychologist, prison and probation services, Passenger transport manager, also works for [employer]  . Financial resource strain: Not on file  . Food insecurity:    Worry: Not on file    Inability: Not on file  . Transportation needs:    Medical: Not on file    Non-medical: Not on file  Tobacco Use  . Smoking status: Never Smoker  . Smokeless tobacco: Never Used  Substance and Sexual Activity  . Alcohol use: Yes    Alcohol/week: 0.0 standard drinks    Comment: rare   . Drug use: No  . Sexual activity: Not on file  Lifestyle  . Physical activity:    Days per week: Not on  file    Minutes per session: Not on file  . Stress: Not on file  Relationships  . Social connections:    Talks on phone: Not on file    Gets together: Not on file    Attends religious service: Not on file    Active member of club or organization: Not on file    Attends meetings of clubs or organizations: Not on file    Relationship status: Not on file  Other Topics Concern  . Not on file  Social History Narrative   Moved from [place] in 2008. Mom lives with them.   Regular exercise:  Works out 3 days a week.   2 children   46 and 7   Daughter 39 is type I diabetic      Mother has platelet disorder (thrombocytosis)      Maternal grand parents - DM II   Family History: Family History  Problem Relation Age of Onset  . Hyperlipidemia Mother   . Hypertension Mother   . Hypertension Father   . Sudden death Father   . Hypertension Brother   . Arrhythmia Brother   . Sudden death Maternal Grandmother   . Diabetes Maternal Grandmother   . Diabetes Maternal Grandfather   . Heart attack Neg Hx    Allergies: No Known Allergies Medications: See med  rec.  Review of Systems: No fevers, chills, night sweats, weight loss, chest pain, or shortness of breath.   Objective:    General: Well Developed, well nourished, and in no acute distress.  Neuro: Alert and oriented x3, extra-ocular muscles intact, sensation grossly intact.  HEENT: Normocephalic, atraumatic, pupils equal round reactive to light, neck supple, no masses, no lymphadenopathy, thyroid nonpalpable.  Skin: Warm and dry, no rashes. Cardiac: Regular rate and rhythm, no murmurs rubs or gallops, no lower extremity edema.  Respiratory: Clear to auscultation bilaterally. Not using accessory muscles, speaking in full sentences. Neck: Negative spurling's Full neck range of motion Grip strength and sensation normal in bilateral hands Strength good C4 to T1 distribution No sensory change to C4 to T1 Reflexes normal Tender to  palpation over the right levator scapulae and trapezius  Procedure:  Injection of #3 trigger points along the right levator scapulae Consent obtained and verified. Time-out conducted. Noted no overlying erythema, induration, or other signs of local infection. Skin prepped in a sterile fashion. Topical analgesic spray: Ethyl chloride. Completed without difficulty. Meds: 1 cc Kenalog 40, 2 cc lidocaine, 2 cc bupivacaine spread out between the 3 trigger points. Pain immediately improved suggesting accurate placement of the medication. Advised to call if fevers/chills, erythema, induration, drainage, or persistent bleeding.  Impression and Recommendations:    Degenerative disc disease, cervical Trigger point injections a month ago provided partial relief, he still has some discomfort over the right levator scapulae and trapezius. #3 trigger point injections today. Return as needed. We can certainly proceed with C-spine MRI for epidural planning if needed.  Right lumbar radiculitis Fantastic relief after bilateral L4-L5 transforaminal epidurals 2 months ago, he did have severe central canal stenosis at L4-L5. He will call us if he desires repeat transforaminal epidurals.  Folliculitis What appears to be folliculitis does seem to be improving slightly, we treated him initially with topical mupirocin, doxycycline, and Hibiclens baths. We did a CBC, CMP, blood cultures, these were normal. He did see his dermatologist who switched to Septra and added topical clindamycin. He does have a history of IBD but this does not appear to be pyoderma gangrenosum, BADAS syndrome or Sweet syndrome. Because it does not bother him I think he should simply watch this for now, if needed we can biopsy at a future visit. ___________________________________________ Gwen Her. Dianah Field, M.D., ABFM., CAQSM. Primary Care and [place] Instructor of [place] of [facility] of Medicine

## 2018-03-24 NOTE — Assessment & Plan Note (Signed)
What appears to be folliculitis does seem to be improving slightly, we treated him initially with topical mupirocin, doxycycline, and Hibiclens baths. We did a CBC, CMP, blood cultures, these were normal. He did see his dermatologist who switched to Septra and added topical clindamycin. He does have a history of IBD but this does not appear to be pyoderma gangrenosum, BADAS syndrome or Sweet syndrome. Because it does not bother him I think he should simply watch this for now, if needed we can biopsy at a future visit.

## 2018-03-24 NOTE — Assessment & Plan Note (Signed)
Trigger point injections a month ago provided partial relief, he still has some discomfort over the right levator scapulae and trapezius. #3 trigger point injections today. Return as needed. We can certainly proceed with C-spine MRI for epidural planning if needed.

## 2018-03-24 NOTE — Assessment & Plan Note (Signed)
Fantastic relief after bilateral L4-L5 transforaminal epidurals 2 months ago, he did have severe central canal stenosis at L4-L5. He will call us if he desires repeat transforaminal epidurals.

## 2018-05-26 ENCOUNTER — Ambulatory Visit (INDEPENDENT_AMBULATORY_CARE_PROVIDER_SITE_OTHER): Payer: BLUE CROSS/BLUE SHIELD | Admitting: Sports Medicine

## 2018-05-26 DIAGNOSIS — M7989 Other specified soft tissue disorders: Secondary | ICD-10-CM

## 2018-05-26 MED ORDER — ELIQUIS 5 MG VTE STARTER PACK: ORAL_TABLET | ORAL | 0 refills | Status: DC

## 2018-05-26 NOTE — Assessment & Plan Note (Addendum)
Patient has had another calf strain. Heel lifts, strapped with compressive dressing. I did see what looked like a noncompressible calf vein, so I am going to start him on Eliquis. I would like him to return to do an official DVT ultrasound, he cannot do it today. He will return when he can for the DVT ultrasound but I will be treating him in the meantime, we can discontinue Eliquis should his official DVT ultrasound be negative. His calf was strapped today with a compressive dressing.

## 2018-05-26 NOTE — Progress Notes (Signed)
Subjective:    CC: Calf injury  HPI: 52 year old male presents for left leg pain. He reports that he was helping to push a car which was stuck on the grass and he felt a pop in the left upper calf. Patient reports that the pain was quite excruciating he had difficulty ambulating for some time. In fact, he was resting in the chair after the injury and when he stood up, the pain got so bad that he almost passed out. This occurred on 2 separate occasions. Eventually, he was able to stand and move around with assistance from 2 other people. He was transported to this hospital for evaluation at that point. He denies any numbness, tingling, burning, stinging, weakness, paralysis. He has painful range of motion of the ankle. But the pain is felt in the upper calf. He denies any open wounds. Regarding the near passing out episodes, no head injury, no seizures, no loss of continence. He is doing well now. His pain is essentially minimal until he moves the calf, and he feels significantly better.  I reviewed the past medical history, family history, social history, surgical history, and allergies today and no changes were needed.  Please see the problem list section below in epic for further details.  Past Medical History: Past Medical History:  Diagnosis Date  . Borderline hypertension   . Hearing loss    Right ear w/ hearing aid (new one in 2008)  . OAB (overactive bladder)    or BPH?  Marland Kitchen Ulcerative colitis    Past Surgical History: Past Surgical History:  Procedure Laterality Date  . RHINOPLASTY  1997  . right ear surgery for scar tissue removal  1996   Social History: Social History   Socioeconomic History  . Marital status: Married    Spouse name: [name]  . Number of children: 2  . Years of education: Not on file  . Highest education level: Not on file  Occupational History  . Occupation: Psychologist, prison and probation services, Passenger transport manager, also works for [employer]  . Financial resource strain: Not  on file  . Food insecurity:    Worry: Not on file    Inability: Not on file  . Transportation needs:    Medical: Not on file    Non-medical: Not on file  Tobacco Use  . Smoking status: Never Smoker  . Smokeless tobacco: Never Used  Substance and Sexual Activity  . Alcohol use: Yes    Alcohol/week: 0.0 standard drinks    Comment: rare   . Drug use: No  . Sexual activity: Not on file  Lifestyle  . Physical activity:    Days per week: Not on file    Minutes per session: Not on file  . Stress: Not on file  Relationships  . Social connections:    Talks on phone: Not on file    Gets together: Not on file    Attends religious service: Not on file    Active member of club or organization: Not on file    Attends meetings of clubs or organizations: Not on file    Relationship status: Not on file  Other Topics Concern  . Not on file  Social History Narrative   Moved from [place] in 2008. Mom lives with them.   Regular exercise:  Works out 3 days a week.   2 children   18 and 7   Daughter 12 is type I diabetic      Mother has platelet disorder (  thrombocytosis)      Maternal grand parents - DM II   Family History: Family History  Problem Relation Age of Onset  . Hyperlipidemia Mother   . Hypertension Mother   . Hypertension Father   . Sudden death Father   . Hypertension Brother   . Arrhythmia Brother   . Sudden death Maternal Grandmother   . Diabetes Maternal Grandmother   . Diabetes Maternal Grandfather   . Heart attack Neg Hx    Allergies: No Known Allergies Medications: See med rec.  Review of Systems: No fevers, chills, night sweats, weight loss, chest pain, or shortness of breath.   Objective:    General: Well Developed, well nourished, and in no acute distress.  Neuro: Alert and oriented x3, extra-ocular muscles intact, sensation grossly intact.  HEENT: Normocephalic, atraumatic, pupils equal round reactive to light, neck supple, no masses, no lymphadenopathy,  thyroid nonpalpable.  Skin: Warm and dry, no rashes. Cardiac: Regular rate and rhythm, no murmurs rubs or gallops, no lower extremity edema.  Respiratory: Clear to auscultation bilaterally. Not using accessory muscles, speaking in full sentences. Left leg: Tender to palpation at the mid gastrocnemius proximal to the musculotendinous junction.  Only minimal calf swelling without edema.  Neurovascularly intact distally.  Procedure: Diagnostic Ultrasound of left calf Device: GE Logiq E  Findings: Noted loss of the normal pink structure of the medial and lateral heads of the gastrocnemius muscle belly.  Also noted dilated veins within the calf veins themselves that appeared to be noncompressible. Images permanently stored and available for review in the ultrasound unit.  Impression: Possible DVT calf veins left.  Grade 1-2 strain of the gastrocnemius.  The knee/calf were strapped with a compressive dressing.  Impression and Recommendations:    Calf swelling Patient has had another calf strain. Heel lifts, strapped with compressive dressing. I did see what looked like a noncompressible calf vein, so I am going to start him on Eliquis. I would like him to return to do an official DVT ultrasound, he cannot do it today. He will return when he can for the DVT ultrasound but I will be treating him in the meantime, we can discontinue Eliquis should his official DVT ultrasound be negative. His calf was strapped today with a compressive dressing.  ___________________________________________ Gwen Her. Dianah Field, M.D., ABFM., CAQSM. Primary Care and Sports Medicine [place] [facility]  Adjunct Professor of [place] of [facility] of Medicine

## 2018-05-27 ENCOUNTER — Ambulatory Visit: Payer: BLUE CROSS/BLUE SHIELD

## 2018-05-27 ENCOUNTER — Encounter: Payer: Self-pay | Admitting: Sports Medicine

## 2018-06-23 ENCOUNTER — Ambulatory Visit: Payer: BLUE CROSS/BLUE SHIELD | Admitting: Sports Medicine

## 2018-09-04 ENCOUNTER — Telehealth: Payer: Self-pay

## 2018-09-04 DIAGNOSIS — Z0000 Encounter for general adult medical examination without abnormal findings: Secondary | ICD-10-CM

## 2018-09-04 NOTE — Telephone Encounter (Signed)
Benjamin Leonard called to get lab orders before his CPE.

## 2018-09-04 NOTE — Telephone Encounter (Signed)
Done

## 2018-09-05 NOTE — Telephone Encounter (Signed)
Left message advising patient

## 2018-09-08 ENCOUNTER — Encounter: Payer: Self-pay | Admitting: Sports Medicine

## 2018-09-08 LAB — COMPREHENSIVE METABOLIC PANEL WITH GFR
ALT: 21 U/L (ref 9–46)
BUN: 14 mg/dL (ref 7–25)
Sodium: 141 mmol/L (ref 135–146)
Total Protein: 6.6 g/dL (ref 6.1–8.1)

## 2018-09-08 LAB — HEMOGLOBIN A1C
Hgb A1c MFr Bld: 5.6 % of total Hgb (ref <5.7)
Mean Plasma Glucose: 114 (calc)
eAG (mmol/L): 6.3 (calc)

## 2018-09-08 LAB — COMPREHENSIVE METABOLIC PANEL
AG Ratio: 1.9 (calc) (ref 1.0–2.5)
AST: 26 U/L (ref 10–35)
Albumin: 4.3 g/dL (ref 3.6–5.1)
Alkaline phosphatase (APISO): 68 U/L (ref 35–144)
CO2: 30 mmol/L (ref 20–32)
Calcium: 9.6 mg/dL (ref 8.6–10.3)
Chloride: 102 mmol/L (ref 98–110)
Creat: 1.33 mg/dL (ref 0.70–1.33)
Globulin: 2.3 g/dL (calc) (ref 1.9–3.7)
Glucose, Bld: 93 mg/dL (ref 65–99)
Potassium: 4.3 mmol/L (ref 3.5–5.3)
Total Bilirubin: 0.5 mg/dL (ref 0.2–1.2)

## 2018-09-08 LAB — LIPID PANEL W/REFLEX DIRECT LDL
Cholesterol: 197 mg/dL (ref <200)
HDL: 64 mg/dL (ref >=40)
LDL Cholesterol (Calc): 111 mg/dL — ABNORMAL HIGH
Non-HDL Cholesterol (Calc): 133 mg/dL (calc) — ABNORMAL HIGH (ref <130)
Total CHOL/HDL Ratio: 3.1 (calc) (ref <5.0)
Triglycerides: 112 mg/dL (ref <150)

## 2018-09-08 LAB — CBC
HCT: 46 % (ref 38.5–50.0)
Hemoglobin: 15.7 g/dL (ref 13.2–17.1)
MCH: 30.8 pg (ref 27.0–33.0)
MCHC: 34.1 g/dL (ref 32.0–36.0)
MCV: 90.4 fL (ref 80.0–100.0)
MPV: 9.3 fL (ref 7.5–12.5)
Platelets: 297 Thousand/uL (ref 140–400)
RBC: 5.09 Million/uL (ref 4.20–5.80)
RDW: 12.4 % (ref 11.0–15.0)
WBC: 5.1 10*3/uL (ref 3.8–10.8)

## 2018-09-08 LAB — URIC ACID: Uric Acid, Serum: 4.7 mg/dL (ref 4.0–8.0)

## 2018-09-08 LAB — PSA, TOTAL AND FREE
PSA, % Free: 50 % (ref >25)
PSA, Free: 0.1 ng/mL
PSA, Total: 0.2 ng/mL (ref <=4.0)

## 2018-09-08 LAB — VITAMIN D 25 HYDROXY (VIT D DEFICIENCY, FRACTURES): Vit D, 25-Hydroxy: 40 ng/mL (ref 30–100)

## 2018-09-08 LAB — TSH: TSH: 2.35 m[IU]/L (ref 0.40–4.50)

## 2018-09-08 MED ORDER — OSELTAMIVIR PHOSPHATE 75 MG PO CAPS
75.0000 mg | ORAL_CAPSULE | Freq: Two times a day (BID) | ORAL | 0 refills | Status: DC
Start: 2018-09-08 — End: 2019-02-09

## 2018-09-09 ENCOUNTER — Other Ambulatory Visit: Payer: Self-pay | Admitting: Sports Medicine

## 2018-09-10 ENCOUNTER — Encounter: Payer: Self-pay | Admitting: Sports Medicine

## 2018-09-10 ENCOUNTER — Ambulatory Visit (INDEPENDENT_AMBULATORY_CARE_PROVIDER_SITE_OTHER): Payer: BLUE CROSS/BLUE SHIELD | Admitting: Sports Medicine

## 2018-09-10 VITALS — BP 112/67 | HR 94 | Ht 72.0 in | Wt 184.0 lb

## 2018-09-10 DIAGNOSIS — J3089 Other allergic rhinitis: Secondary | ICD-10-CM

## 2018-09-10 DIAGNOSIS — S46219A Strain of muscle, fascia and tendon of other parts of biceps, unspecified arm, initial encounter: Secondary | ICD-10-CM

## 2018-09-10 DIAGNOSIS — Z0000 Encounter for general adult medical examination without abnormal findings: Secondary | ICD-10-CM

## 2018-09-10 MED ORDER — BENZONATATE 200 MG PO CAPS: 200.0000 mg | ORAL_CAPSULE | Freq: Three times a day (TID) | ORAL | 0 refills | Status: DC | PRN

## 2018-09-10 MED ORDER — AZELASTINE HCL 0.1 % NA SOLN: 2.0000 | Freq: Two times a day (BID) | NASAL | 1 refills | Status: DC

## 2018-09-10 NOTE — Assessment & Plan Note (Signed)
History of biceps tendon tear status post repair 5 years ago. Having some increasing pain in the anterior right elbow, exam is benign, he will do some eccentric rehabilitation. This is most likely a grade 1 strain/tendinitis.

## 2018-09-10 NOTE — Assessment & Plan Note (Signed)
Has not responded well to Flonase. Switching to nasal azelastine. He does have some postnasal drip syndrome so we are going to add Tessalon Perles for short course. Other options include Dymista.

## 2018-09-10 NOTE — Progress Notes (Signed)
Subjective:    CC: Annual physical with complaints  HPI:  Benjamin Leonard returns, he is a pleasant 53 year old male, he is here for his physical, he does have several complaints.  Right elbow pain: Occurred a couple of weeks ago, he is post complete rupture with retraction of the right distal biceps, this was repaired surgically.  This occurred about 5 years ago, overall he did well until more recently, in the gym, felt a slight but nagging pain in the anterior elbow.  Now has pain with resisted wrist flexion as in doing curls.  Mild, no bruising, no swelling.  Runny nose: Perennial allergic rhinitis, he is gone through multiple allergist, allergy testing, shots, without much improvement.  Initially he did well with Flonase, this is now showing some tachyphylaxis and he would like another option.  Mild runny nose, itchiness, and a cough that is persistent.  No fevers, chills, muscle aches, body aches.  No shortness of breath.  I reviewed the past medical history, family history, social history, surgical history, and allergies today and no changes were needed.  Please see the problem list section below in epic for further details.  Past Medical History: Past Medical History:  Diagnosis Date  . Borderline hypertension   . Hearing loss    Right ear w/ hearing aid (new one in 2008)  . OAB (overactive bladder)    or BPH?  Marland Kitchen Ulcerative colitis    Past Surgical History: Past Surgical History:  Procedure Laterality Date  . RHINOPLASTY  1997  . right ear surgery for scar tissue removal  1996   Social History: Social History   Socioeconomic History  . Marital status: Married    Spouse name: [name]  . Number of children: 2  . Years of education: Not on file  . Highest education level: Not on file  Occupational History  . Occupation: Psychologist, prison and probation services, Passenger transport manager, also works for [employer]  . Financial resource strain: Not on file  . Food insecurity:    Worry: Not on file   Inability: Not on file  . Transportation needs:    Medical: Not on file    Non-medical: Not on file  Tobacco Use  . Smoking status: Never Smoker  . Smokeless tobacco: Never Used  Substance and Sexual Activity  . Alcohol use: Yes    Alcohol/week: 0.0 standard drinks    Comment: rare   . Drug use: No  . Sexual activity: Not on file  Lifestyle  . Physical activity:    Days per week: Not on file    Minutes per session: Not on file  . Stress: Not on file  Relationships  . Social connections:    Talks on phone: Not on file    Gets together: Not on file    Attends religious service: Not on file    Active member of club or organization: Not on file    Attends meetings of clubs or organizations: Not on file    Relationship status: Not on file  Other Topics Concern  . Not on file  Social History Narrative   Moved from [place] in 2008. Mom lives with them.   Regular exercise:  Works out 3 days a week.   2 children   41 and 7   Daughter 49 is type I diabetic      Mother has platelet disorder (thrombocytosis)      Maternal grand parents - DM II   Family History: Family History  Problem Relation  Age of Onset  . Hyperlipidemia Mother   . Hypertension Mother   . Hypertension Father   . Sudden death Father   . Hypertension Brother   . Arrhythmia Brother   . Sudden death Maternal Grandmother   . Diabetes Maternal Grandmother   . Diabetes Maternal Grandfather   . Heart attack Neg Hx    Allergies: No Known Allergies Medications: See med rec.  Review of Systems: No headache, visual changes, nausea, vomiting, diarrhea, constipation, dizziness, abdominal pain, skin rash, fevers, chills, night sweats, swollen lymph nodes, weight loss, chest pain, body aches, joint swelling, muscle aches, shortness of breath, mood changes, visual or auditory hallucinations.  Objective:    General: Well Developed, well nourished, and in no acute distress.  Neuro: Alert and oriented x3, extra-ocular  muscles intact, sensation grossly intact. Cranial nerves II through XII are intact, motor, sensory, and coordinative functions are all intact. HEENT: Normocephalic, atraumatic, pupils equal round reactive to light, neck supple, no masses, no lymphadenopathy, thyroid nonpalpable. Oropharynx, nasopharynx, external ear canals are unremarkable. Skin: Warm and dry, no rashes noted.  Cardiac: Regular rate and rhythm, no murmurs rubs or gallops.  Respiratory: Clear to auscultation bilaterally. Not using accessory muscles, speaking in full sentences.  Abdominal: Soft, nontender, nondistended, positive bowel sounds, no masses, no organomegaly.  Right elbow: Unremarkable to inspection. Range of motion full pronation, supination, flexion, extension. Strength is full to all of the above directions Stable to varus, valgus stress. Negative moving valgus stress test. No discrete areas of tenderness to palpation. Ulnar nerve does not sublux. Negative cubital tunnel Tinel's. I am able to loop his distal biceps tendon with my finger, it is really nontender, good strength of pronation and supination.  Impression and Recommendations:    The patient was counselled, risk factors were discussed, anticipatory guidance given.  Annual physical exam Unremarkable physical exam. Return in a year.  Right distal Biceps tendon tear History of biceps tendon tear status post repair 5 years ago. Having some increasing pain in the anterior right elbow, exam is benign, he will do some eccentric rehabilitation. This is most likely a grade 1 strain/tendinitis.  Perennial allergic rhinitis Has not responded well to Flonase. Switching to nasal azelastine. He does have some postnasal drip syndrome so we are going to add Tessalon Perles for short course. Other options include Dymista.  ___________________________________________ Gwen Her. Dianah Field, M.D., ABFM., CAQSM. Primary Care and Sports Medicine [place]  [facility]  Adjunct Professor of [place] of [facility] of Medicine

## 2018-09-10 NOTE — Assessment & Plan Note (Signed)
Unremarkable physical exam. Return in a year.

## 2018-09-16 MED ORDER — HYDROCOD POLST-CPM POLST ER 10-8 MG/5ML PO SUER: 5.0000 mL | Freq: Two times a day (BID) | ORAL | 0 refills | Status: DC | PRN

## 2018-09-16 NOTE — Addendum Note (Signed)
Addended by: Silverio Decamp on: 09/16/2018 11:24 AM   Modules accepted: Orders

## 2018-12-15 LAB — HM COLONOSCOPY

## 2019-01-02 ENCOUNTER — Encounter: Payer: Self-pay | Admitting: Sports Medicine

## 2019-01-02 DIAGNOSIS — J3089 Other allergic rhinitis: Secondary | ICD-10-CM

## 2019-01-05 MED ORDER — MOMETASONE FUROATE 50 MCG/ACT NA SUSP: NASAL | 6 refills | Status: DC

## 2019-01-05 NOTE — Assessment & Plan Note (Signed)
Failed Nasacort, azelastine, Flonase. Oral antihistamines. Adding Nasonex.

## 2019-01-22 ENCOUNTER — Encounter: Payer: Self-pay | Admitting: Sports Medicine

## 2019-02-09 ENCOUNTER — Ambulatory Visit (INDEPENDENT_AMBULATORY_CARE_PROVIDER_SITE_OTHER): Payer: BC Managed Care – PPO

## 2019-02-09 ENCOUNTER — Other Ambulatory Visit: Payer: Self-pay

## 2019-02-09 ENCOUNTER — Encounter: Payer: Self-pay | Admitting: Sports Medicine

## 2019-02-09 ENCOUNTER — Ambulatory Visit (INDEPENDENT_AMBULATORY_CARE_PROVIDER_SITE_OTHER): Payer: BC Managed Care – PPO | Admitting: Sports Medicine

## 2019-02-09 VITALS — BP 110/69 | HR 72 | Ht 72.0 in | Wt 183.0 lb

## 2019-02-09 DIAGNOSIS — F32A Depression, unspecified: Secondary | ICD-10-CM

## 2019-02-09 DIAGNOSIS — M25541 Pain in joints of right hand: Secondary | ICD-10-CM | POA: Diagnosis not present

## 2019-02-09 DIAGNOSIS — H1013 Acute atopic conjunctivitis, bilateral: Secondary | ICD-10-CM

## 2019-02-09 DIAGNOSIS — M19042 Primary osteoarthritis, left hand: Secondary | ICD-10-CM

## 2019-02-09 DIAGNOSIS — R5383 Other fatigue: Secondary | ICD-10-CM

## 2019-02-09 DIAGNOSIS — Z23 Encounter for immunization: Secondary | ICD-10-CM | POA: Diagnosis not present

## 2019-02-09 DIAGNOSIS — F419 Anxiety disorder, unspecified: Secondary | ICD-10-CM | POA: Diagnosis not present

## 2019-02-09 DIAGNOSIS — F329 Major depressive disorder, single episode, unspecified: Secondary | ICD-10-CM

## 2019-02-09 DIAGNOSIS — H1010 Acute atopic conjunctivitis, unspecified eye: Secondary | ICD-10-CM | POA: Insufficient documentation

## 2019-02-09 DIAGNOSIS — M19041 Primary osteoarthritis, right hand: Secondary | ICD-10-CM

## 2019-02-09 MED ORDER — VIIBRYD 20 MG PO TABS: 1.0000 | ORAL_TABLET | Freq: Every day | ORAL | 3 refills | Status: DC

## 2019-02-09 MED ORDER — PREDNISONE 50 MG PO TABS: ORAL_TABLET | ORAL | 0 refills | Status: DC

## 2019-02-09 MED ORDER — OLOPATADINE HCL 0.1 % OP SOLN: 1.0000 [drp] | Freq: Two times a day (BID) | OPHTHALMIC | 11 refills | Status: DC

## 2019-02-09 NOTE — Assessment & Plan Note (Signed)
With Bouchard and Heberden nodes, worst in the right fifth PIP. Doing a full work-up with a full rheumatoid panel, lupus panel. Adding a 5-day burst of prednisone. I do not think that this is cubital tunnel syndrome.

## 2019-02-09 NOTE — Progress Notes (Signed)
Subjective:    CC: Multiple issues  HPI: Fatigue: Nonspecific, he wonders if his testosterone may be low.  Hand pain: Right fifth PIP.  He has also noted nodularity of his DIPs and PIPs in both hands.  Moderate morning stiffness, pain.  No paresthesias, numbness, tingling.  I reviewed the past medical history, family history, social history, surgical history, and allergies today and no changes were needed.  Please see the problem list section below in epic for further details.  Past Medical History: Past Medical History:  Diagnosis Date  . Borderline hypertension   . Hearing loss    Right ear w/ hearing aid (new one in 2008)  . OAB (overactive bladder)    or BPH?  Marland Kitchen Ulcerative colitis    Past Surgical History: Past Surgical History:  Procedure Laterality Date  . RHINOPLASTY  1997  . right ear surgery for scar tissue removal  1996   Social History: Social History   Socioeconomic History  . Marital status: Married    Spouse name: [name]  . Number of children: 2  . Years of education: Not on file  . Highest education level: Not on file  Occupational History  . Occupation: Psychologist, prison and probation services, Passenger transport manager, also works for [employer]  . Financial resource strain: Not on file  . Food insecurity    Worry: Not on file    Inability: Not on file  . Transportation needs    Medical: Not on file    Non-medical: Not on file  Tobacco Use  . Smoking status: Never Smoker  . Smokeless tobacco: Never Used  Substance and Sexual Activity  . Alcohol use: Yes    Alcohol/week: 0.0 standard drinks    Comment: rare   . Drug use: No  . Sexual activity: Not on file  Lifestyle  . Physical activity    Days per week: Not on file    Minutes per session: Not on file  . Stress: Not on file  Relationships  . Social Herbalist on phone: Not on file    Gets together: Not on file    Attends religious service: Not on file    Active member of club or organization: Not on file   Attends meetings of clubs or organizations: Not on file    Relationship status: Not on file  Other Topics Concern  . Not on file  Social History Narrative   Moved from [place] in 2008. Mom lives with them.   Regular exercise:  Works out 3 days a week.   2 children   38 and 7   Daughter 52 is type I diabetic      Mother has platelet disorder (thrombocytosis)      Maternal grand parents - DM II   Family History: Family History  Problem Relation Age of Onset  . Hyperlipidemia Mother   . Hypertension Mother   . Hypertension Father   . Sudden death Father   . Hypertension Brother   . Arrhythmia Brother   . Sudden death Maternal Grandmother   . Diabetes Maternal Grandmother   . Diabetes Maternal Grandfather   . Heart attack Neg Hx    Allergies: No Known Allergies Medications: See med rec.  Review of Systems: No fevers, chills, night sweats, weight loss, chest pain, or shortness of breath.   Objective:    General: Well Developed, well nourished, and in no acute distress.  Neuro: Alert and oriented x3, extra-ocular muscles intact, sensation grossly  intact.  HEENT: Normocephalic, atraumatic, pupils equal round reactive to light, neck supple, no masses, no lymphadenopathy, thyroid nonpalpable.  Skin: Warm and dry, no rashes. Cardiac: Regular rate and rhythm, no murmurs rubs or gallops, no lower extremity edema.  Respiratory: Clear to auscultation bilaterally. Not using accessory muscles, speaking in full sentences. Hands: Bilateral Bouchard and Heberden nodes with tenderness most remarkably so over the right fifth PIP.  No visible or palpable triggering, negative cubital tunnel Tinel's sign.  Impression and Recommendations:    Fatigue We have discussed how heterogenous the diagnosis and possible causes of fatigue is. Starting with testosterone testing. Adding other routine labs. If everything is unrevealing we will also add a home sleep study.  Primary osteoarthritis of both  hands With Bouchard and Heberden nodes, worst in the right fifth PIP. Doing a full work-up with a full rheumatoid panel, lupus panel. Adding a 5-day burst of prednisone. I do not think that this is cubital tunnel syndrome.   ___________________________________________ Gwen Her. Dianah Field, M.D., ABFM., CAQSM. Primary Care and Sports Medicine [place] [facility]  Adjunct Professor of [place] of [facility] of Medicine

## 2019-02-09 NOTE — Assessment & Plan Note (Signed)
We have discussed how heterogenous the diagnosis and possible causes of fatigue is. Starting with testosterone testing. Adding other routine labs. If everything is unrevealing we will also add a home sleep study.

## 2019-02-09 NOTE — Assessment & Plan Note (Signed)
Adding Patanol, if too expensive we will switch to [employer]

## 2019-02-13 ENCOUNTER — Other Ambulatory Visit: Payer: Self-pay

## 2019-02-13 ENCOUNTER — Telehealth: Payer: Self-pay | Admitting: Family Medicine

## 2019-02-13 DIAGNOSIS — Z20822 Contact with and (suspected) exposure to covid-19: Secondary | ICD-10-CM

## 2019-02-13 DIAGNOSIS — Z20828 Contact with and (suspected) exposure to other viral communicable diseases: Secondary | ICD-10-CM

## 2019-02-13 LAB — COMPLETE METABOLIC PANEL WITH GFR
AG Ratio: 1.8 (calc) (ref 1.0–2.5)
ALT: 18 U/L (ref 9–46)
AST: 21 U/L (ref 10–35)
Albumin: 4.2 g/dL (ref 3.6–5.1)
Alkaline phosphatase (APISO): 66 U/L (ref 35–144)
BUN: 18 mg/dL (ref 7–25)
CO2: 33 mmol/L — ABNORMAL HIGH (ref 20–32)
Calcium: 9.6 mg/dL (ref 8.6–10.3)
Chloride: 103 mmol/L (ref 98–110)
Creat: 1.19 mg/dL (ref 0.70–1.33)
GFR, Est African American: 80 mL/min/{1.73_m2} (ref >=60)
GFR, Est Non African American: 69 mL/min/{1.73_m2} (ref >=60)
Globulin: 2.4 g/dL (calc) (ref 1.9–3.7)
Glucose, Bld: 95 mg/dL (ref 65–139)
Potassium: 4.4 mmol/L (ref 3.5–5.3)
Sodium: 139 mmol/L (ref 135–146)
Total Bilirubin: 0.7 mg/dL (ref 0.2–1.2)
Total Protein: 6.6 g/dL (ref 6.1–8.1)

## 2019-02-13 LAB — T3, FREE: T3, Free: 3.1 pg/mL (ref 2.3–4.2)

## 2019-02-13 LAB — ANA, IFA COMPREHENSIVE PANEL
Anti Nuclear Antibody (ANA): NEGATIVE
ENA SM Ab Ser-aCnc: <1 AI
SM/RNP: <1 AI
SSA (Ro) (ENA) Antibody, IgG: <1 AI
SSB (La) (ENA) Antibody, IgG: <1 AI
Scleroderma (Scl-70) (ENA) Antibody, IgG: <1 AI
ds DNA Ab: <1 IU/mL

## 2019-02-13 LAB — CK: Total CK: 185 U/L (ref 44–196)

## 2019-02-13 LAB — TESTOSTERONE, FREE & TOTAL
Free Testosterone: 135.8 pg/mL (ref 35.0–155.0)
Testosterone, Total, LC-MS-MS: 559 ng/dL (ref 250–1100)

## 2019-02-13 LAB — RHEUMATOID ARTHRITIS DIAGNOSTIC PANEL, COMPREHENSIVE
Cyclic Citrullin Peptide Ab: <16 Units (ref <20)
Rheumatoid Factor (IgA): <5 U (ref <=6)
Rheumatoid Factor (IgG): <5 U (ref <=6)
Rheumatoid Factor (IgM): <5 U (ref <=6)
SSA (Ro) (ENA) Antibody, IgG: <1 AI
SSB (La) (ENA) Antibody, IgG: <1 AI

## 2019-02-13 LAB — CBC
HCT: 44.3 % (ref 38.5–50.0)
Hemoglobin: 15.2 g/dL (ref 13.2–17.1)
MCH: 31 pg (ref 27.0–33.0)
MCHC: 34.3 g/dL (ref 32.0–36.0)
MCV: 90.2 fL (ref 80.0–100.0)
MPV: 9.1 fL (ref 7.5–12.5)
Platelets: 296 10*3/uL (ref 140–400)
RBC: 4.91 10*6/uL (ref 4.20–5.80)
RDW: 12.4 % (ref 11.0–15.0)
WBC: 3.8 10*3/uL (ref 3.8–10.8)

## 2019-02-13 LAB — VITAMIN D 25 HYDROXY (VIT D DEFICIENCY, FRACTURES): Vit D, 25-Hydroxy: 39 ng/mL (ref 30–100)

## 2019-02-13 LAB — TSH: TSH: 1.78 mIU/L (ref 0.40–4.50)

## 2019-02-13 LAB — SEDIMENTATION RATE: Sed Rate: 2 mm/h (ref 0–20)

## 2019-02-13 LAB — T4, FREE: Free T4: 1.1 ng/dL (ref 0.8–1.8)

## 2019-02-13 LAB — URIC ACID: Uric Acid, Serum: 5.1 mg/dL (ref 4.0–8.0)

## 2019-02-13 NOTE — Telephone Encounter (Signed)
During virtual visit with patient's wife, Davey Limas, they noted that they were around a friend on Wednesday evening for about 2 hours sharing dinner but eating outside.  That friend actually tested positive.  He is wanting to get tested. Order placed.  Encouraged to go either this weekend or on Monday as it has been less than 48 hours since exposure.

## 2019-02-18 LAB — NOVEL CORONAVIRUS, NAA: SARS-CoV-2, NAA: NOT DETECTED

## 2019-02-20 ENCOUNTER — Encounter: Payer: Self-pay | Admitting: Sports Medicine

## 2019-02-20 DIAGNOSIS — M2550 Pain in unspecified joint: Secondary | ICD-10-CM

## 2019-02-23 DIAGNOSIS — M7989 Other specified soft tissue disorders: Secondary | ICD-10-CM | POA: Insufficient documentation

## 2019-02-23 NOTE — Assessment & Plan Note (Signed)
Polyarthralgia with negative rheumatoid testing including negative rheumatoid factor, CCP, lupus panel, CK, ESR, CRP, sedimentation rate, uric acid. He does have a psoriasic form rash and I would like an opinion from both dermatology and rheumatology to see if this could represent psoriatic arthritis.

## 2019-02-27 NOTE — Telephone Encounter (Signed)
Okay to place new referral per patient preference.  Please let him know when it is done.

## 2019-03-02 ENCOUNTER — Encounter: Payer: Self-pay | Admitting: Sports Medicine

## 2019-03-02 DIAGNOSIS — M2550 Pain in unspecified joint: Secondary | ICD-10-CM

## 2019-03-09 ENCOUNTER — Ambulatory Visit: Payer: BC Managed Care – PPO | Admitting: Sports Medicine

## 2019-03-17 ENCOUNTER — Ambulatory Visit: Payer: BC Managed Care – PPO | Admitting: Rheumatology

## 2019-03-17 ENCOUNTER — Other Ambulatory Visit: Payer: Self-pay

## 2019-03-17 ENCOUNTER — Encounter: Payer: Self-pay | Admitting: Rheumatology

## 2019-03-17 VITALS — BP 119/78 | HR 68 | Resp 14 | Ht 72.0 in | Wt 180.2 lb

## 2019-03-17 DIAGNOSIS — M5136 Other intervertebral disc degeneration, lumbar region: Secondary | ICD-10-CM

## 2019-03-17 DIAGNOSIS — M5030 Other cervical disc degeneration, unspecified cervical region: Secondary | ICD-10-CM | POA: Diagnosis not present

## 2019-03-17 DIAGNOSIS — Z8639 Personal history of other endocrine, nutritional and metabolic disease: Secondary | ICD-10-CM

## 2019-03-17 DIAGNOSIS — H5789 Other specified disorders of eye and adnexa: Secondary | ICD-10-CM

## 2019-03-17 DIAGNOSIS — M19042 Primary osteoarthritis, left hand: Secondary | ICD-10-CM

## 2019-03-17 DIAGNOSIS — Z8719 Personal history of other diseases of the digestive system: Secondary | ICD-10-CM

## 2019-03-17 DIAGNOSIS — M2550 Pain in unspecified joint: Secondary | ICD-10-CM | POA: Diagnosis not present

## 2019-03-17 DIAGNOSIS — M7062 Trochanteric bursitis, left hip: Secondary | ICD-10-CM

## 2019-03-17 DIAGNOSIS — F32A Depression, unspecified: Secondary | ICD-10-CM

## 2019-03-17 DIAGNOSIS — K449 Diaphragmatic hernia without obstruction or gangrene: Secondary | ICD-10-CM

## 2019-03-17 DIAGNOSIS — F419 Anxiety disorder, unspecified: Secondary | ICD-10-CM

## 2019-03-17 DIAGNOSIS — M51369 Other intervertebral disc degeneration, lumbar region without mention of lumbar back pain or lower extremity pain: Secondary | ICD-10-CM

## 2019-03-17 DIAGNOSIS — F329 Major depressive disorder, single episode, unspecified: Secondary | ICD-10-CM

## 2019-03-17 DIAGNOSIS — M19041 Primary osteoarthritis, right hand: Secondary | ICD-10-CM

## 2019-03-17 NOTE — Progress Notes (Signed)
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   History of Present Illness: Benjamin Leonard is a 53 y.o. male seen in consultation per request of Dr. Dianah Field.  According to patient he was diagnosed with ulcerative colitis at age 89.  He initially was on high-fiber diet for couple of years and then he recalls in his 85s he had only 2 episodes of UC which were treated with prednisone and other medications he does not recall.  In his 24s he had one severe flare for which he needed oral prednisone and some other medications he does not recall.  He states 10 years ago he had mild flares.  He has been under care of Dr. Glennon Hamilton gastroenterologist.  He has been treated with Lialda and he has had no flare in the last 10 years.  He states on January 20, 2019 he started having inflammation in his eyes at the time he was seen by his eye doctor who diagnosed her with dry eyes.  He was advised to use drops.  A week later he developed reaction to some of the teardrops he was using and developed swelling on his eyelids and also redness in his eyelids.  He went back to the ophthalmologist who said it was an allergic reaction and gave him some eyedrops.  He was also seen by dermatologist who also felt it was allergic reaction and gave him some topical gel.  He states 2 days later he woke up with swelling in his hands.  He was seen by Dr. Dianah Field who did x-rays and labs.  According to patient the x-ray showed mild osteoarthritis and the labs were negative for rheumatoid factor.  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  He has noticed some rash on his extremities and on his scalp.  He states the scalp rash was biopsied which  was consistent with eczema.  He is concerned that he may have psoriasis.  He also gives history of fatigue intermittently which is better now.  There is no family history of autoimmune disease.  Activities of Daily Living:  Patient reports morning stiffness for 0 minutes.   Patient Denies nocturnal pain.  Difficulty dressing/grooming: Denies Difficulty climbing stairs: Denies Difficulty getting out of chair: Denies Difficulty using hands for taps, buttons, cutlery, and/or writing: Denies  Review of Systems  Constitutional: Positive for fatigue. Negative for night sweats.  HENT: Negative for mouth sores, mouth dryness and nose dryness.   Eyes: Negative for pain, redness, itching and dryness.  Respiratory: Negative for shortness of breath, wheezing and difficulty breathing.   Cardiovascular: Negative for chest pain, palpitations, hypertension, irregular heartbeat and swelling in legs/feet.  Gastrointestinal: Negative for abdominal pain, blood in stool, constipation and diarrhea.  Endocrine: Negative for increased urination.  Genitourinary: Positive for difficulty urinating. Negative for painful urination.  Musculoskeletal: Positive for joint swelling. Negative for arthralgias, joint pain, myalgias, muscle weakness, morning stiffness, muscle tenderness and myalgias.  Skin: Negative for color change, rash, hair loss, nodules/bumps, skin tightness, ulcers and sensitivity to sunlight.  Allergic/Immunologic: Negative for susceptible to infections.  Neurological: Negative for dizziness, fainting, light-headedness, headaches, memory loss, night sweats and weakness.  Hematological: Negative for bruising/bleeding tendency and swollen glands.  Psychiatric/Behavioral: Negative for depressed mood, confusion and sleep disturbance. The patient is not nervous/anxious.     PMFS History:  Patient Active Problem List   Diagnosis Date Noted   Polyarthralgia with rash 02/23/2019   Primary osteoarthritis  of both hands 02/09/2019   Allergic conjunctivitis 78/93/8101   Folliculitis 75/04/2584   Chronic foot pain, right 12/27/2017   Calf swelling 12/06/2017   Right patellofemoral syndrome 04/12/2016   Hyperlipidemia 01/10/2016   Degenerative disc disease, cervical 01/10/2016   Cough 08/05/2015   Anxiety and depression 02/15/2015   Left Achilles tendinitis 01/18/2015   Right lumbar radiculitis 09/30/2014   Hiatal hernia 09/03/2014   Hemangioma of liver 08/06/2014   Exertional chest pain 06/22/2014   Urinary obstruction 12/08/2013   Right distal Biceps tendon tear 12/04/2012   Annual physical exam 03/08/2012   Fatigue 10/19/2010   Erectile dysfunction 10/19/2010   Perennial allergic rhinitis 10/19/2010   COLITIS, ULCERATIVE NOS 05/27/2007    Past Medical History:  Diagnosis Date   Borderline hypertension    Hearing loss    Right ear w/ hearing aid (new one in 2008)   OAB (overactive bladder)    or BPH?   Ulcerative colitis     Family History  Problem Relation Age of Onset   Hyperlipidemia Mother    Hypertension Mother    Hypertension Father    Sudden death Father    Hypertension Brother    Diabetes Brother    Arrhythmia Brother    Sudden death Maternal Grandmother    Diabetes Maternal Grandmother    Diabetes Maternal Grandfather    Gout Brother    Arthritis Brother    Healthy Son    Diabetes Daughter    Healthy Daughter    Heart attack Neg Hx    Past Surgical History:  Procedure Laterality Date   BICEPS TENDON REPAIR Right 2016   RHINOPLASTY  1997   right ear surgery for scar tissue removal  1996   Social History   Social History Narrative   Moved from [place] in 2008. Mom lives with them.   Regular exercise:  Works out 3 days a week.   2 children   44 and 7   Daughter 26 is type I diabetic      Mother has platelet disorder (thrombocytosis)      Maternal grand parents - DM II   Immunization History  Administered  Date(s) Administered   Influenza Whole 05/27/2007   Pneumococcal Polysaccharide-23 04/12/2016   Td 11/20/2007   Tdap 02/09/2019   Zoster Recombinat (Shingrix) 05/14/2017, 07/17/2017     Objective: Vital Signs: BP 119/78 (BP Location: Right Arm, Patient Position: Sitting, Cuff Size: Normal)    Pulse 68    Resp 14    Ht 6' (1.829 m)    Wt 180 lb 3.2 oz (81.7 kg)    BMI 24.44 kg/m    Physical Exam Vitals signs and nursing note reviewed.  Constitutional:      Appearance: He is well-developed.  HENT:     Head: Normocephalic and atraumatic.  Eyes:     Conjunctiva/sclera: Conjunctivae normal.     Pupils: Pupils are equal, round, and reactive to light.  Neck:     Musculoskeletal: Normal range of motion and neck supple.  Cardiovascular:     Rate and Rhythm: Normal rate and regular rhythm.  Heart sounds: Normal heart sounds.  Pulmonary:     Effort: Pulmonary effort is normal.     Breath sounds: Normal breath sounds.  Abdominal:     General: Bowel sounds are normal.     Palpations: Abdomen is soft.  Skin:    General: Skin is warm and dry.     Capillary Refill: Capillary refill takes less than 2 seconds.  Neurological:     Mental Status: He is alert and oriented to person, place, and time.  Psychiatric:        Behavior: Behavior normal.      Musculoskeletal Exam: C-spine thoracic and lumbar spine with good range of motion.  He has no SI joint tenderness.  Shoulder joints, elbow joints, wrist joints, MCPs with good range of motion.  He had bilateral PIP and DIP thickening with no synovitis.  No tenderness was elicited on palpation of PIP and DIP joints.  No nail dystrophy or nail pitting was noted.  He had good range of motion of bilateral hip joints.  He had mild trochanteric bursitis.  Knee joints and ankle joints with good range of motion.  There was no evidence of Achilles tendinitis or plantar fasciitis.  He had some nail dystrophy most likely traumatic. CDAI Exam: CDAI  Score: -- Patient Global: --; Provider Global: -- Swollen: --; Tender: -- Joint Exam   No joint exam has been documented for this visit   There is currently no information documented on the homunculus. Go to the Rheumatology activity and complete the homunculus joint exam.  Investigation: Findings:  02/09/19: ANA-, ENA-, RF-, CCP 16, vitamin D 39, uric acid 5.1, CK 185, sed rate 2  Component     Latest Ref Rng & Units 02/09/2019 02/09/2019         9:30 AM  9:30 AM  Anti Nuclear Antibody (ANA)     NEGATIVE NEGATIVE   ds DNA Ab     IU/mL <1   Scleroderma (Scl-70) (ENA) Antibody, IgG     <1.0 NEG AI <1.0 NEG   ENA SM Ab Ser-aCnc     <1.0 NEG AI <1.0 NEG   SM/RNP     <1.0 NEG AI <1.0 NEG   SSA (Ro) (ENA) Antibody, IgG     <1.0 NEG AI <1.0 NEG <1.0  SSB (La) (ENA) Antibody, IgG     <1.0 NEG AI <1.0 NEG <1.0  Rheumatoid Factor (IgG)     <=6 U  <5  Rheumatoid Factor (IgA)     <=6 U  <5  Rheumatoid Factor (IgM)     <=6 U  <5  Cyclic Citrullin Peptide Ab     <20 Units  <16  Vitamin D, 25-Hydroxy     30 - 100 ng/mL 39   Uric Acid, Serum     4.0 - 8.0 mg/dL 5.1   CK Total     44 - 196 U/L 185   Sed Rate     0 - 20 mm/h 2    Imaging: No results found.  Recent Labs: Lab Results  Component Value Date   WBC 3.8 02/09/2019   HGB 15.2 02/09/2019   PLT 296 02/09/2019   NA 139 02/09/2019   K 4.4 02/09/2019   CL 103 02/09/2019   CO2 33 (H) 02/09/2019   GLUCOSE 95 02/09/2019   BUN 18 02/09/2019   CREATININE 1.19 02/09/2019   BILITOT 0.7 02/09/2019   ALKPHOS 70 01/06/2016   AST 21 02/09/2019   ALT 18 02/09/2019  PROT 6.6 02/09/2019   ALBUMIN 4.4 01/06/2016   CALCIUM 9.6 02/09/2019   GFRAA 80 02/09/2019    Speciality Comments: No specialty comments available.  Procedures:  No procedures performed Allergies: Patient has no known allergies.   Assessment / Plan:     Visit Diagnoses: Polyarthralgia -patient complains of pain in multiple joints over the year and  also some tendinitis.  He is concerned that he may have psoriatic arthritis as he gets some recurrent rash.  He has been evaluated by dermatologist and had even his skin biopsy which was negative for psoriasis and was positive for eczema.  He has been evaluated by Dr. Dianah Field and all the autoimmune work-up has been negative.  Patient also gives history of surgery for plantar fasciitis and an episode of Achilles tendinitis in the past.  02/09/19: ANA-, ENA-, RF-, CCP 16, vitamin D 39, uric acid 5.1, CK 185, sed rate 2 -  Primary osteoarthritis of both hands -the clinical exam today was consistent with osteoarthritis.  I did not see any synovitis on examination.  I also reviewed the x-rays done by Dr. Dianah Field which were consistent with osteoarthritis.  I will schedule ultrasound of bilateral hands as he believes that he is underlying inflammation in his joints.  He had no tenderness on examination.  Eye inflammation -patient had several visits to his ophthalmologist.  He believes despite using multiple eyedrops his eyes are still inflamed.  Plan: Ambulatory referral to Ophthalmology, I will refer him to Dr. 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  Trochanteric bursitis-bilateral.  He had mild trochanteric bursitis.  IT band stretches were discussed.  I believe is related to his job as he has to sit for prolonged time.  DDD (degenerative disc disease), cervical -he currently does not have much discomfort.  X-rays were reviewed.  DDD (degenerative disc disease), lumbar -he has some stiffness but not much pain.  X-rays were reviewed .  History of ulcerative colitis -he has had ulcerative colitis since he was 53 years old.  He was treated with prednisone for some time and has been on Lialda for several years now.  He states he has been in remission for the last 10 years.  I did explain to him that sometimes ulcerative colitis can be associated with eye inflammation and arthritis.  Plan:  Ambulatory referral to Ophthalmology,   History of hyperlipidemia -dietary management.  Hiatal hernia   Anxiety and depression -patient is on medications.  Orders: Orders Placed This Encounter  Procedures   Ambulatory referral to Ophthalmology   No orders of the defined types were placed in this encounter.   Face-to-face time spent with patient was 50 minutes. Greater than 50% of time was spent in counseling and coordination of care.  Follow-Up Instructions: Return in about 2 months (around 05/17/2019) for Pain in both hands.   Bo Merino, MD  Note - This record has been created using Editor, commissioning.  Chart creation errors have been sought, but may not always  have been located. Such creation errors do not reflect on  the standard of medical care.

## 2019-03-17 NOTE — Telephone Encounter (Signed)
Done - CF °

## 2019-03-25 ENCOUNTER — Encounter: Payer: Self-pay | Admitting: Sports Medicine

## 2019-03-25 ENCOUNTER — Ambulatory Visit (INDEPENDENT_AMBULATORY_CARE_PROVIDER_SITE_OTHER): Payer: BC Managed Care – PPO | Admitting: Sports Medicine

## 2019-03-25 ENCOUNTER — Other Ambulatory Visit: Payer: Self-pay

## 2019-03-25 DIAGNOSIS — M7989 Other specified soft tissue disorders: Secondary | ICD-10-CM | POA: Diagnosis not present

## 2019-03-25 NOTE — Assessment & Plan Note (Signed)
Persistent distal and proximal interphalangeal joint swelling, x-rays for the most part unremarkable with a bit of osteoarthritis. 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 A full rheumatoid work-up was negative. He did see the rheumatologist already, has a second opinion coming up. He does get some eye redness, swelling, and does have an appointment with a specialist and autoimmune ophthalmology. We are going to go ahead and proceed with an MRI, he has failed greater than 6 weeks of physician directed conservative measures.

## 2019-03-25 NOTE — Progress Notes (Addendum)
Subjective:    CC: Hand swelling  HPI: Benjamin Leonard is a pleasant 53 year old male with ulcerative colitis, he has occasional episodes of interphalangeal joint swelling without pain, for the most part he can live with it.  He is still questioning the cause.  We have done x-rays, a full rheumatoid work-up, he has seen a rheumatologist.  He is wondering whether Biologics would be efficacious here.   I reviewed the past medical history, family history, social history, surgical history, and allergies today and no changes were needed.  Please see the problem list section below in epic for further details.  Past Medical History: Past Medical History:  Diagnosis Date  . Borderline hypertension   . Hearing loss    Right ear w/ hearing aid (new one in 2008)  . OAB (overactive bladder)    or BPH?  Marland Kitchen Ulcerative colitis    Past Surgical History: Past Surgical History:  Procedure Laterality Date  . BICEPS TENDON REPAIR Right 2016  . RHINOPLASTY  1997  . right ear surgery for scar tissue removal  1996   Social History: Social History   Socioeconomic History  . Marital status: Married    Spouse name: Benjamin Leonard  . Number of children: 2  . Years of education: Not on file  . Highest education level: Not on file  Occupational History  . Occupation: Psychologist, prison and probation services, Passenger transport manager, also works for [employer]  . Financial resource strain: Not on file  . Food insecurity    Worry: Not on file    Inability: Not on file  . Transportation needs    Medical: Not on file    Non-medical: Not on file  Tobacco Use  . Smoking status: Never Smoker  . Smokeless tobacco: Never Used  Substance and Sexual Activity  . Alcohol use: Yes    Alcohol/week: 0.0 standard drinks    Comment: rare   . Drug use: No  . Sexual activity: Not on file  Lifestyle  . Physical activity    Days per week: Not on file    Minutes per session: Not on file  . Stress: Not on file  Relationships  . Social Clinical research associate on phone: Not on file    Gets together: Not on file    Attends religious service: Not on file    Active member of club or organization: Not on file    Attends meetings of clubs or organizations: Not on file    Relationship status: Not on file  Other Topics Concern  . Not on file  Social History Narrative   Moved from [place] in 2008. Mom lives with them.   Regular exercise:  Works out 3 days a week.   2 children   24 and 7   Daughter 38 is type I diabetic      Mother has platelet disorder (thrombocytosis)      Maternal grand parents - DM II   Family History: Family History  Problem Relation Age of Onset  . Hyperlipidemia Mother   . Hypertension Mother   . Hypertension Father   . Sudden death Father   . Hypertension Brother   . Diabetes Brother   . Arrhythmia Brother   . Sudden death Maternal Grandmother   . Diabetes Maternal Grandmother   . Diabetes Maternal Grandfather   . Gout Brother   . Arthritis Brother   . Healthy Son   . Diabetes Daughter   . Healthy Daughter   . Heart attack  Neg Hx    Allergies: No Known Allergies Medications: See med rec.  Review of Systems: No fevers, chills, night sweats, weight loss, chest pain, or shortness of breath.   Objective:    General: Well Developed, well nourished, and in no acute distress.  Neuro: Alert and oriented x3, extra-ocular muscles intact, sensation grossly intact.  HEENT: Normocephalic, atraumatic, pupils equal round reactive to light, neck supple, no masses, no lymphadenopathy, thyroid nonpalpable.  Skin: Warm and dry, no rashes. Cardiac: Regular rate and rhythm, no murmurs rubs or gallops, no lower extremity edema.  Respiratory: Clear to auscultation bilaterally. Not using accessory muscles, speaking in full sentences. Hands: Bilateral Bouchard and Heberden nodes with only mild synovitis if any at all.  Impression and Recommendations:    Bilateral hand swelling Persistent distal and proximal interphalangeal  joint swelling, x-rays for the most part unremarkable with a bit of osteoarthritis. 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 A full rheumatoid work-up was negative. He did see the rheumatologist already, has a second opinion coming up. He does get some eye redness, swelling, and does have an appointment with a specialist and autoimmune ophthalmology. We are going to go ahead and proceed with an MRI, he has failed greater than 6 weeks of physician directed conservative measures.  I spent 25 minutes with this patient, greater than 50% was face-to-face time counseling regarding the above diagnoses.  ___________________________________________ Gwen Her. Dianah Field, M.D., ABFM., CAQSM. Primary Care and Sports Medicine [facility]  Adjunct Professor of [employer] [facility] [employer]

## 2019-03-30 ENCOUNTER — Encounter: Payer: Self-pay | Admitting: Sports Medicine

## 2019-03-31 ENCOUNTER — Ambulatory Visit: Payer: BC Managed Care – PPO | Admitting: Rheumatology

## 2019-04-01 ENCOUNTER — Telehealth: Payer: Self-pay | Admitting: Sports Medicine

## 2019-04-01 NOTE — Telephone Encounter (Signed)
FMLA papers filled out today.

## 2019-04-07 DIAGNOSIS — H4423 Degenerative myopia, bilateral: Secondary | ICD-10-CM | POA: Insufficient documentation

## 2019-04-07 DIAGNOSIS — H04129 Dry eye syndrome of unspecified lacrimal gland: Secondary | ICD-10-CM | POA: Insufficient documentation

## 2019-04-07 DIAGNOSIS — H2513 Age-related nuclear cataract, bilateral: Secondary | ICD-10-CM | POA: Insufficient documentation

## 2019-04-09 ENCOUNTER — Encounter: Payer: Self-pay | Admitting: Sports Medicine

## 2019-04-10 ENCOUNTER — Encounter: Payer: Self-pay | Admitting: Sports Medicine

## 2019-04-27 ENCOUNTER — Ambulatory Visit: Payer: BC Managed Care – PPO | Admitting: Rheumatology

## 2019-05-04 NOTE — Progress Notes (Deleted)
Office Visit Note  Patient: Benjamin Leonard             Date of Birth: 1965/10/15           MRN: [id number]             PCP: Silverio Decamp, MD Referring: Silverio Decamp,* Visit Date: 05/18/2019 Occupation: @GUAROCC @  Subjective:  No chief complaint on file.   History of Present Illness: Benjamin Leonard is a 52 y.o. male ***   Activities of Daily Living:  Patient reports morning stiffness for *** {minute/hour:19697}.   Patient {ACTIONS;DENIES/REPORTS:21021675::"Denies"} nocturnal pain.  Difficulty dressing/grooming: {ACTIONS;DENIES/REPORTS:21021675::"Denies"} Difficulty climbing stairs: {ACTIONS;DENIES/REPORTS:21021675::"Denies"} Difficulty getting out of chair: {ACTIONS;DENIES/REPORTS:21021675::"Denies"} Difficulty using hands for taps, buttons, cutlery, and/or writing: {ACTIONS;DENIES/REPORTS:21021675::"Denies"}  No Rheumatology ROS completed.   PMFS History:  Patient Active Problem List   Diagnosis Date Noted  . Bilateral hand swelling 02/23/2019  . Primary osteoarthritis of both hands 02/09/2019  . Allergic conjunctivitis 02/09/2019  . Folliculitis [id number]  . Chronic foot pain, right 12/27/2017  . Calf swelling 12/06/2017  . Right patellofemoral syndrome 04/12/2016  . Hyperlipidemia 01/10/2016  . Degenerative disc disease, cervical 01/10/2016  . Cough 08/05/2015  . Anxiety and depression 02/15/2015  . Left Achilles tendinitis 01/18/2015  . Right lumbar radiculitis 09/30/2014  . Hiatal hernia 09/03/2014  . Hemangioma of liver 08/06/2014  . Exertional chest pain 06/22/2014  . Urinary obstruction 12/08/2013  . Right distal Biceps tendon tear 12/04/2012  . Annual physical exam 03/08/2012  . Fatigue 10/19/2010  . Erectile dysfunction 10/19/2010  . Perennial allergic rhinitis 10/19/2010  . COLITIS, ULCERATIVE NOS 05/27/2007    Past Medical History:  Diagnosis Date  . Borderline hypertension   . Hearing loss    Right ear w/ hearing aid (new  one in 2008)  . OAB (overactive bladder)    or BPH?  Marland Kitchen Ulcerative colitis     Family History  Problem Relation Age of Onset  . Hyperlipidemia Mother   . Hypertension Mother   . Hypertension Father   . Sudden death Father   . Hypertension Brother   . Diabetes Brother   . Arrhythmia Brother   . Sudden death Maternal Grandmother   . Diabetes Maternal Grandmother   . Diabetes Maternal Grandfather   . Gout Brother   . Arthritis Brother   . Healthy Son   . Diabetes Daughter   . Healthy Daughter   . Heart attack Neg Hx    Past Surgical History:  Procedure Laterality Date  . BICEPS TENDON REPAIR Right 2016  . RHINOPLASTY  1997  . right ear surgery for scar tissue removal  1996   Social History   Social History Narrative   Moved from [place] in 2008. Mom lives with them.   Regular exercise:  Works out 3 days a week.   2 children   94 and 7   Daughter 75 is type I diabetic      Mother has platelet disorder (thrombocytosis)      Maternal grand parents - DM II   Immunization History  Administered Date(s) Administered  . Influenza Whole 05/27/2007  . Pneumococcal Polysaccharide-23 04/12/2016  . Td 11/20/2007  . Tdap 02/09/2019  . Zoster Recombinat (Shingrix) 05/14/2017, 07/17/2017     Objective: Vital Signs: There were no vitals taken for this visit.   Physical Exam   Musculoskeletal Exam: ***  CDAI Exam: CDAI Score: - Patient Global: -; Provider Global: - Swollen: -; Tender: - Joint Exam  No joint exam has been documented for this visit   There is currently no information documented on the homunculus. Go to the Rheumatology activity and complete the homunculus joint exam.  Investigation: No additional findings.  Imaging: No results found.  Recent Labs: Lab Results  Component Value Date   WBC 3.8 02/09/2019   HGB 15.2 02/09/2019   PLT 296 02/09/2019   NA 139 02/09/2019   K 4.4 02/09/2019   CL 103 02/09/2019   CO2 33 (H) 02/09/2019   GLUCOSE 95  02/09/2019   BUN 18 02/09/2019   CREATININE 1.19 02/09/2019   BILITOT 0.7 02/09/2019   ALKPHOS 70 01/06/2016   AST 21 02/09/2019   ALT 18 02/09/2019   PROT 6.6 02/09/2019   ALBUMIN 4.4 01/06/2016   CALCIUM 9.6 02/09/2019   GFRAA 80 02/09/2019   02/09/19: ANA-, ENA-, RF-, CCP 16, vitamin D 39, uric acid 5.1, CK 185, sed rate 2 Speciality Comments: No specialty comments available.  Procedures:  No procedures performed Allergies: Patient has no known allergies.   Assessment / Plan:     Visit Diagnoses: No diagnosis found.  Orders: No orders of the defined types were placed in this encounter.  No orders of the defined types were placed in this encounter.   Face-to-face time spent with patient was *** minutes. Greater than 50% of time was spent in counseling and coordination of care.  Follow-Up Instructions: No follow-ups on file.   Bo Merino, MD  Note - This record has been created using Editor, commissioning.  Chart creation errors have been sought, but may not always  have been located. Such creation errors do not reflect on  the standard of medical care.

## 2019-05-08 ENCOUNTER — Encounter: Payer: Self-pay | Admitting: Sports Medicine

## 2019-05-08 DIAGNOSIS — F329 Major depressive disorder, single episode, unspecified: Secondary | ICD-10-CM

## 2019-05-08 DIAGNOSIS — F419 Anxiety disorder, unspecified: Secondary | ICD-10-CM

## 2019-05-08 DIAGNOSIS — F32A Depression, unspecified: Secondary | ICD-10-CM

## 2019-05-08 MED ORDER — VIIBRYD 20 MG PO TABS: 1.0000 | ORAL_TABLET | Freq: Every day | ORAL | 1 refills | Status: DC

## 2019-05-18 ENCOUNTER — Ambulatory Visit: Payer: BC Managed Care – PPO | Admitting: Rheumatology

## 2019-06-10 ENCOUNTER — Encounter: Payer: Self-pay | Admitting: Sports Medicine

## 2019-06-10 DIAGNOSIS — F32A Depression, unspecified: Secondary | ICD-10-CM

## 2019-06-10 DIAGNOSIS — F329 Major depressive disorder, single episode, unspecified: Secondary | ICD-10-CM

## 2019-06-10 DIAGNOSIS — F419 Anxiety disorder, unspecified: Secondary | ICD-10-CM

## 2019-06-12 MED ORDER — CITALOPRAM HYDROBROMIDE 10 MG PO TABS: 10.0000 mg | ORAL_TABLET | Freq: Every day | ORAL | 3 refills | Status: DC

## 2019-07-06 ENCOUNTER — Encounter: Payer: Self-pay | Admitting: Sports Medicine

## 2019-07-06 NOTE — Telephone Encounter (Signed)
Sent pt wife note that no order needed for [facility] testing

## 2019-07-12 ENCOUNTER — Other Ambulatory Visit: Payer: Self-pay | Admitting: Sports Medicine

## 2019-07-12 DIAGNOSIS — F329 Major depressive disorder, single episode, unspecified: Secondary | ICD-10-CM

## 2019-07-12 DIAGNOSIS — F32A Depression, unspecified: Secondary | ICD-10-CM

## 2019-07-12 DIAGNOSIS — F419 Anxiety disorder, unspecified: Secondary | ICD-10-CM

## 2019-08-13 ENCOUNTER — Other Ambulatory Visit: Payer: Self-pay

## 2019-08-13 ENCOUNTER — Encounter: Payer: Self-pay | Admitting: Sports Medicine

## 2019-08-13 ENCOUNTER — Ambulatory Visit (INDEPENDENT_AMBULATORY_CARE_PROVIDER_SITE_OTHER): Payer: BC Managed Care – PPO | Admitting: Sports Medicine

## 2019-08-13 DIAGNOSIS — Z23 Encounter for immunization: Secondary | ICD-10-CM | POA: Diagnosis not present

## 2019-08-13 DIAGNOSIS — M66242 Spontaneous rupture of extensor tendons, left hand: Secondary | ICD-10-CM | POA: Diagnosis not present

## 2019-08-13 DIAGNOSIS — H04123 Dry eye syndrome of bilateral lacrimal glands: Secondary | ICD-10-CM

## 2019-08-13 DIAGNOSIS — H04129 Dry eye syndrome of unspecified lacrimal gland: Secondary | ICD-10-CM | POA: Insufficient documentation

## 2019-08-13 DIAGNOSIS — M66232 Spontaneous rupture of extensor tendons, left forearm: Secondary | ICD-10-CM

## 2019-08-13 DIAGNOSIS — M25432 Effusion, left wrist: Secondary | ICD-10-CM | POA: Insufficient documentation

## 2019-08-13 DIAGNOSIS — Z0000 Encounter for general adult medical examination without abnormal findings: Secondary | ICD-10-CM | POA: Diagnosis not present

## 2019-08-13 DIAGNOSIS — H01024 Squamous blepharitis left upper eyelid: Secondary | ICD-10-CM

## 2019-08-13 DIAGNOSIS — H01003 Unspecified blepharitis right eye, unspecified eyelid: Secondary | ICD-10-CM | POA: Insufficient documentation

## 2019-08-13 DIAGNOSIS — H01021 Squamous blepharitis right upper eyelid: Secondary | ICD-10-CM

## 2019-08-13 DIAGNOSIS — H01006 Unspecified blepharitis left eye, unspecified eyelid: Secondary | ICD-10-CM | POA: Insufficient documentation

## 2019-08-13 MED ORDER — TRIAMCINOLONE ACETONIDE 0.05 % EX OINT: TOPICAL_OINTMENT | CUTANEOUS | 0 refills | Status: DC

## 2019-08-13 NOTE — Assessment & Plan Note (Signed)
Referral to Dr. Gwenevere Ghazi downstairs.

## 2019-08-13 NOTE — Assessment & Plan Note (Signed)
[name] already had his pneumococcal 23 vaccine, he will get another one at age 54.

## 2019-08-13 NOTE — Progress Notes (Addendum)
    Procedures performed today:    None.  Independent interpretation of tests performed by another provider:   None.  Impression and Recommendations:    Tendon rupture, nontraumatic, hand or wrist extensor, left Benjamin Leonard has swelling over his radial mid forearm, no trauma but he does work Merchandiser, retail with a highly repetitive job. He has a positive Finkelstein sign, significant pain. My differential is fairly broad but does include rupture of the extensor pollicis brevis tendon, he has 0/5 extension at the first MCP. Intersection syndrome is also a possibility, considering his history of tendon ruptures in the past and the possible diagnoses here we are going to proceed with an MRI of his left forearm with extension at least to the level of the MCP. He has some tramadol at home. We are also in to write him out of work for a week.  Annual physical exam Benjamin Leonard already had his pneumococcal 23 vaccine, he will get another one at age 49.  Blepharitis of both eyes Benjamin Leonard has noted an itchy rash over his top eyelids bilaterally. Adding low-dose triamcinolone 0.05% twice daily. We can revisit this in 2 weeks.  Dry eye syndrome Referral to Dr. Gwenevere Ghazi downstairs.    ___________________________________________ Gwen Her. Dianah Field, M.D., ABFM., CAQSM. Primary Care and [place] Instructor of [place] [facility]

## 2019-08-13 NOTE — Addendum Note (Signed)
Addended by: Silverio Decamp on: 08/13/2019 12:06 PM   Modules accepted: Orders

## 2019-08-13 NOTE — Assessment & Plan Note (Addendum)
Benjamin Leonard has swelling over his radial mid forearm, no trauma but he does work Merchandiser, retail with a highly repetitive job. He has a positive Finkelstein sign, significant pain. My differential is fairly broad but does include rupture of the extensor pollicis brevis tendon, he has 0/5 extension at the first MCP. Intersection syndrome is also a possibility, considering his history of tendon ruptures in the past and the possible diagnoses here we are going to proceed with an MRI of his left forearm with extension at least to the level of the MCP. He has some tramadol at home. We are also in to write him out of work for a week.

## 2019-08-13 NOTE — Assessment & Plan Note (Signed)
Benjamin Leonard has noted an itchy rash over his top eyelids bilaterally. Adding low-dose triamcinolone 0.05% twice daily. We can revisit this in 2 weeks.

## 2019-08-21 ENCOUNTER — Other Ambulatory Visit: Payer: Self-pay

## 2019-08-21 ENCOUNTER — Ambulatory Visit (INDEPENDENT_AMBULATORY_CARE_PROVIDER_SITE_OTHER): Payer: BC Managed Care – PPO | Admitting: Sports Medicine

## 2019-08-21 DIAGNOSIS — M25432 Effusion, left wrist: Secondary | ICD-10-CM | POA: Diagnosis not present

## 2019-08-21 NOTE — Progress Notes (Signed)
    Procedures performed today:    None.  Independent interpretation of tests performed by another provider:   None.  Impression and Recommendations:    Swelling of left wrist Benjamin Leonard returns, he has swelling over his left dorsal lateral wrist. Initially our differential was EPB rupture versus intersection syndrome. He did have 0/5 extension strength at the thumb MCP. Ultimately we obtained an MRI that showed signs of intersection syndrome, it was difficult to visualize the EPB. On exam I can feel the EPB at the level of the carpometacarpal joint, he still has mild swelling and mild tenderness at the intersection point. Continue brace, I think at this point we simply need hand therapy, he still has good function of his hand. Also going to do 2 weeks out of work. Return to see me in 1 month, injection if no better.    ___________________________________________ Gwen Her. Dianah Field, M.D., ABFM., CAQSM. Primary Care and [place] Instructor of [place] [facility]

## 2019-08-21 NOTE — Assessment & Plan Note (Signed)
Benjamin Leonard returns, he has swelling over his left dorsal lateral wrist. Initially our differential was EPB rupture versus intersection syndrome. He did have 0/5 extension strength at the thumb MCP. Ultimately we obtained an MRI that showed signs of intersection syndrome, it was difficult to visualize the EPB. On exam I can feel the EPB at the level of the carpometacarpal joint, he still has mild swelling and mild tenderness at the intersection point. Continue brace, I think at this point we simply need hand therapy, he still has good function of his hand. Also going to do 2 weeks out of work. Return to see me in 1 month, injection if no better.

## 2019-08-27 ENCOUNTER — Other Ambulatory Visit: Payer: Self-pay

## 2019-08-27 ENCOUNTER — Encounter: Payer: Self-pay | Admitting: Rehabilitative and Restorative Service Providers"

## 2019-08-27 ENCOUNTER — Ambulatory Visit (INDEPENDENT_AMBULATORY_CARE_PROVIDER_SITE_OTHER): Payer: BC Managed Care – PPO | Admitting: Rehabilitative and Restorative Service Providers"

## 2019-08-27 DIAGNOSIS — M25532 Pain in left wrist: Secondary | ICD-10-CM

## 2019-08-27 DIAGNOSIS — M6281 Muscle weakness (generalized): Secondary | ICD-10-CM | POA: Diagnosis not present

## 2019-08-27 NOTE — Patient Instructions (Signed)
Access Code: [email or web address]  URL: [email or web address]  Date: 08/27/2019  Prepared by: Rudell Cobb   Exercises Seated Wrist Radial Deviation with Anchored Resistance - 12 reps - 1 sets - 2x daily - 7x weekly Wynn Maudlin Stretch - 3 reps - 1 sets - 20 seconds hold - 2x daily - 7x weekly Seated Thumb IP Extension PROM - 10 reps - 1 sets - 2x daily - 7x weekly Patient Education Ice Massage

## 2019-08-27 NOTE — Therapy (Signed)
[facility] [place], [place], [address] Phone: [phone]   Fax:  [phone]  Physical Therapy Evaluation  Patient Details  Name: Benjamin Leonard MRN: [id number] Date of Birth: October 07, 1965 Referring Provider (PT): Silverio Decamp, MD   Encounter Date: 08/27/2019  PT End of Session - 08/27/19 1843    Visit Number  1    Number of Visits  8    Date for PT Re-Evaluation  10/11/19    PT Start Time  [id number]    PT Stop Time  1452    PT Time Calculation (min)  34 min       Past Medical History:  Diagnosis Date  . Borderline hypertension   . Hearing loss    Right ear w/ hearing aid (new one in 2008)  . OAB (overactive bladder)    or BPH?  Marland Kitchen Ulcerative colitis     Past Surgical History:  Procedure Laterality Date  . BICEPS TENDON REPAIR Right 2016  . RHINOPLASTY  1997  . right ear surgery for scar tissue removal  1996    There were no vitals filed for this visit.   Subjective Assessment - 08/27/19 1623    Subjective  The patient presents today with L forearm pain that began suddenly on 08/10/2019.  He had a sudden onset of pain after return to work on 08/10/2019 (s/p a L shoulder surgery).  His pain was accompanied by swelling and weakness in the L thumb.  He was sensitive to light touch.  He has been out of work and splinting and feels significant improvement since that time.    Patient Stated Goals  Return to work and activities without pain.    Currently in Pain?  No/denies         [facility] PT Assessment - 08/27/19 1626      Assessment   Medical Diagnosis  L wrist intersection syndrome    Referring Provider (PT)  Silverio Decamp, MD    Hand Dominance  Right    Next MD Visit  09/18/2019      Precautions   Precaution Comments  recent L shoulder surgery      Restrictions   Weight Bearing Restrictions  No      Balance Screen   Has the patient fallen in the past 6 months  No    Has the  patient had a decrease in activity level because of a fear of falling?   No    Is the patient reluctant to leave their home because of a fear of falling?   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      Observation/Other Assessments   Focus on Therapeutic Outcomes (FOTO)   69% (31% limitation)      Sensation   Light Touch  Appears Intact      ROM / Strength   AROM / PROM / Strength  AROM;Strength      AROM   Overall AROM   Within functional limits for tasks performed      Strength   Overall Strength  Deficits    Overall Strength Comments  L thumb IP extension is 4/5, composite thumb extension-- patient uses MCP hyperextension to gain motion.     Strength Assessment Site  Wrist;Hand  Right/Left Wrist  Right;Left    Left Wrist Flexion  5/5    Left Wrist Extension  5/5    Left Wrist Radial Deviation  4/5    Left Wrist Ulnar Deviation  5/5    Right/Left hand  Left;Right    Right Hand Grip (lbs)  115    Right Hand Lateral Pinch  13 lbs    Left Hand Grip (lbs)  95    Left Hand Lateral Pinch  11 lbs      Palpation   Palpation comment  Mild tenderness to palpation EPL tendon 2" proximal to wrist      Special Tests   Other special tests  finkelstein's positive, froment's sign negative                Objective measurements completed on examination: See above findings.      [facility] Adult PT Treatment/Exercise - 08/27/19 2056      Exercises   Exercises  Hand      Hand Exercises   Other Hand Exercises  L thumb IP joint blocked extension x 10 reps, EPL stretch x 20 second x 2 reps, radial deviation L with yellow theraband x 10 reps.    Other Hand Exercises  education in ice massage      Modalities   Modalities  Iontophoresis      Iontophoresis   Type of Iontophoresis  Dexamethasone    Location  L EPL tendon     Dose  40  mAmp-min    Time  6 hour patch             PT Education - 08/27/19 1841    Education Details  HEP    Person(s) Educated  Patient    Methods  Explanation;Demonstration;Handout    Comprehension  Verbalized understanding;Returned demonstration          PT Long Term Goals - 08/27/19 2046      PT LONG TERM GOAL #1   Title  The patient will be indep with HEP.    Time  6    Period  Weeks    Target Date  10/11/19      PT LONG TERM GOAL #2   Title  The patient will reduce functional limitation per FOTO to < or equal to 20%    Baseline  31% limited per FOTO    Time  6    Period  Weeks    Target Date  10/11/19      PT LONG TERM GOAL #3   Title  The patient will return to work without increased L wrist pain.    Time  6    Period  Weeks    Target Date  10/11/19      PT LONG TERM GOAL #4   Title  The patient will improve L IP extension to 5/5 strength.    Time  6    Period  Weeks    Target Date  10/11/19             Plan - 08/27/19 1652    Clinical Impression Statement  The patient is a 54 year old male with onset of L wrist pain after returning to work after a prolonged absence from shoulder surgery.  Since onset, symptoms have improved with splinting and conservative mgmt.  He presents today with imapired L thumb IP extension strength, tightness in EPL tendon per + finkelstein's test, and mild tenderness over EPL tendon.  PT to address deficits  to promote improved function and return to work and recreational tasks.    Personal Factors and Comorbidities  Comorbidity 1    Comorbidities  recent L shoulder surgery    Examination-Activity Limitations  Lift    Stability/Clinical Decision Making  Stable/Uncomplicated    Clinical Decision Making  Low    Rehab Potential  Good    PT Frequency  2x / week   anticipate decrease to 1x/week after initial HEP established.   PT Duration  6 weeks    PT Treatment/Interventions  ADLs/Self Care Home Management;Iontophoresis 4mg /ml  Dexamethasone;Cryotherapy;Ultrasound;Therapeutic exercise;Therapeutic activities;Patient/family education;Manual techniques;Taping;Splinting    PT Next Visit Plan  Iontophoresis, nerve glide L UE, wrist stretching/strengthening    Consulted and Agree with Plan of Care  Patient       Patient will benefit from skilled therapeutic intervention in order to improve the following deficits and impairments:  Pain, Decreased strength, Impaired flexibility  Visit Diagnosis: Pain in left wrist  Muscle weakness (generalized)     Problem List Patient Active Problem List   Diagnosis Date Noted  . Swelling of left wrist 08/13/2019  . Blepharitis of both eyes 08/13/2019  . Dry eye syndrome 08/13/2019  . Bilateral hand swelling 02/23/2019  . Primary osteoarthritis of both hands 02/09/2019  . Allergic conjunctivitis 02/09/2019  . Folliculitis [id number]  . Chronic foot pain, right 12/27/2017  . Calf swelling 12/06/2017  . Right patellofemoral syndrome 04/12/2016  . Hyperlipidemia 01/10/2016  . Degenerative disc disease, cervical 01/10/2016  . Cough 08/05/2015  . Anxiety and depression 02/15/2015  . Left Achilles tendinitis 01/18/2015  . Right lumbar radiculitis 09/30/2014  . Hiatal hernia 09/03/2014  . Hemangioma of liver 08/06/2014  . Exertional chest pain 06/22/2014  . Urinary obstruction 12/08/2013  . Right distal Biceps tendon tear 12/04/2012  . Annual physical exam 03/08/2012  . Fatigue 10/19/2010  . Erectile dysfunction 10/19/2010  . Perennial allergic rhinitis 10/19/2010  . COLITIS, ULCERATIVE NOS 05/27/2007    Voncile Schwarz, PT 08/27/2019, 9:23 PM  [facility] [place], [place], [address] Phone: [phone]   Fax:  [phone]  Name: JIA MALVIN MRN: [id number] Date of Birth: 1966/04/20

## 2019-08-28 ENCOUNTER — Ambulatory Visit (INDEPENDENT_AMBULATORY_CARE_PROVIDER_SITE_OTHER): Payer: BC Managed Care – PPO | Admitting: Rehabilitative and Restorative Service Providers"

## 2019-08-28 ENCOUNTER — Encounter: Payer: Self-pay | Admitting: Rehabilitative and Restorative Service Providers"

## 2019-08-28 DIAGNOSIS — M25532 Pain in left wrist: Secondary | ICD-10-CM | POA: Diagnosis not present

## 2019-08-28 DIAGNOSIS — M6281 Muscle weakness (generalized): Secondary | ICD-10-CM

## 2019-08-28 NOTE — Patient Instructions (Signed)
Access Code: [email or web address]  URL: [email or web address]  Date: 08/28/2019  Prepared by: Rudell Cobb   Exercises Seated Wrist Radial Deviation with Anchored Resistance - 12 reps - 1 sets - 2x daily - 7x weekly Wynn Maudlin Stretch - 3 reps - 1 sets - 20 seconds hold - 2x daily - 7x weekly Seated Thumb IP Extension PROM - 10 reps - 1 sets - 2x daily - 7x weekly Radial Nerve Flossing - 10 reps - 1 sets - 2x daily - 7x weekly Radial Nerve Cross-Over - 10 reps - 1 sets - 2x daily - 7x weekly Radial Nerve Mobilization - 10 reps - 1 sets - 2x daily - 7x weekly Radial Nerve Tensioner - 5 reps - 1 sets - 15 seconds hold - 2x daily - 7x weekly Patient Education Ice Massage

## 2019-08-28 NOTE — Therapy (Signed)
[facility] [place]  [place], [place], [address] Phone: [phone]   Fax:  [phone]  Physical Therapy Treatment  Patient Details  Name: Benjamin Leonard MRN: [id number] Date of Birth: 1965/11/05 Referring Provider (PT): Benjamin Decamp, MD   Encounter Date: 08/28/2019  PT End of Session - 08/28/19 1314    Visit Number  2    Number of Visits  8    Date for PT Re-Evaluation  10/11/19    PT Start Time  [id number]    PT Stop Time  1345    PT Time Calculation (min)  30 min       Past Medical History:  Diagnosis Date  . Borderline hypertension   . Hearing loss    Right ear w/ hearing aid (new one in 2008)  . OAB (overactive bladder)    or BPH?  Marland Kitchen Ulcerative colitis     Past Surgical History:  Procedure Laterality Date  . BICEPS TENDON REPAIR Right 2016  . RHINOPLASTY  1997  . right ear surgery for scar tissue removal  1996    There were no vitals filed for this visit.  Subjective Assessment - 08/28/19 1638    Subjective  No increase in pain.  Patient tolerated the ionto patch well yesterday.    Patient Stated Goals  Return to work and activities without pain.    Currently in Pain?  No/denies                       [facility] Adult PT Treatment/Exercise - 08/28/19 1355      Exercises   Exercises  Hand      Hand Exercises   Other Hand Exercises  L thumb MCP joint isolated extension *he is not able to do resistance band on thumb as this encourages hypermobility of the carpal joints.  Composite thumb extension focusing on reduced IP hyperextension and movement of MCP.  Performed isometric composite thumb extension x 5 reps.  Blocked IP flexion/extension.  Stretching into MCP extension with passive overpressure applied through right hand.    Other Hand Exercises  Standing radial nerve glides, flossing, and tension stretching.        Modalities   Modalities  Iontophoresis      Iontophoresis   Type of Iontophoresis  Dexamethasone    Location  L EPL tendon    Dose  1.0cc    Time  6 hour pratch             PT Education - 08/28/19 1353    Education Details  updated/modified HEP    Person(s) Educated  Patient    Methods  Explanation;Demonstration;Handout    Comprehension  Verbalized understanding;Returned demonstration          PT Long Term Goals - 08/27/19 2046      PT LONG TERM GOAL #1   Title  The patient will be indep with HEP.    Time  6    Period  Weeks    Target Date  10/11/19      PT LONG TERM GOAL #2   Title  The patient will reduce functional limitation per FOTO to < or equal to 20%    Baseline  31% limited per FOTO    Time  6    Period  Weeks    Target Date  10/11/19      PT LONG TERM GOAL #3   Title  The patient will return  to work without increased L wrist pain.    Time  6    Period  Weeks    Target Date  10/11/19      PT LONG TERM GOAL #4   Title  The patient will improve L IP extension to 5/5 strength.    Time  6    Period  Weeks    Target Date  10/11/19            Plan - 08/28/19 1636    Clinical Impression Statement  PT added radial nerve flossing and tension stretches to current HEP in order to improve neural gliding for return to work.  Also recommended continue EPL strengthening and stretching.  PT used iontophoresis to continue to reduce inflammation in forearm.  Continue to LTGs.    Personal Factors and Comorbidities  Comorbidity 1    Comorbidities  recent L shoulder surgery    Examination-Activity Limitations  Lift    Stability/Clinical Decision Making  Stable/Uncomplicated    Rehab Potential  Good    PT Frequency  2x / week   anticipate decrease to 1x/week after initial HEP established.   PT Duration  6 weeks    PT Treatment/Interventions  ADLs/Self Care Home Management;Iontophoresis 4mg /ml Dexamethasone;Cryotherapy;Ultrasound;Therapeutic exercise;Therapeutic activities;Patient/family education;Manual  techniques;Taping;Splinting    PT Next Visit Plan  Iontophoresis, nerve glide L UE, wrist stretching/strengthening    Consulted and Agree with Plan of Care  Patient       Patient will benefit from skilled therapeutic intervention in order to improve the following deficits and impairments:  Pain, Decreased strength, Impaired flexibility  Visit Diagnosis: Pain in left wrist  Muscle weakness (generalized)     Problem List Patient Active Problem List   Diagnosis Date Noted  . Swelling of left wrist 08/13/2019  . Blepharitis of both eyes 08/13/2019  . Dry eye syndrome 08/13/2019  . Bilateral hand swelling 02/23/2019  . Primary osteoarthritis of both hands 02/09/2019  . Allergic conjunctivitis 02/09/2019  . Folliculitis [id number]  . Chronic foot pain, right 12/27/2017  . Calf swelling 12/06/2017  . Right patellofemoral syndrome 04/12/2016  . Hyperlipidemia 01/10/2016  . Degenerative disc disease, cervical 01/10/2016  . Cough 08/05/2015  . Anxiety and depression 02/15/2015  . Left Achilles tendinitis 01/18/2015  . Right lumbar radiculitis 09/30/2014  . Hiatal hernia 09/03/2014  . Hemangioma of liver 08/06/2014  . Exertional chest pain 06/22/2014  . Urinary obstruction 12/08/2013  . Right distal Biceps tendon tear 12/04/2012  . Annual physical exam 03/08/2012  . Fatigue 10/19/2010  . Erectile dysfunction 10/19/2010  . Perennial allergic rhinitis 10/19/2010  . COLITIS, ULCERATIVE NOS 05/27/2007    Benjamin Leonard, PT 08/28/2019, 4:39 PM  [facility] [place], [place], [address] Phone: [phone]   Fax:  [phone]  Name: Benjamin Leonard MRN: [id number] Date of Birth: 13-Dec-1965

## 2019-09-03 ENCOUNTER — Ambulatory Visit (INDEPENDENT_AMBULATORY_CARE_PROVIDER_SITE_OTHER): Payer: BC Managed Care – PPO | Admitting: Physical Therapy

## 2019-09-03 ENCOUNTER — Other Ambulatory Visit: Payer: Self-pay

## 2019-09-03 ENCOUNTER — Encounter: Payer: Self-pay | Admitting: Physical Therapy

## 2019-09-03 DIAGNOSIS — M25532 Pain in left wrist: Secondary | ICD-10-CM | POA: Diagnosis not present

## 2019-09-03 DIAGNOSIS — M6281 Muscle weakness (generalized): Secondary | ICD-10-CM

## 2019-09-03 NOTE — Therapy (Addendum)
[facility] [address] [place], [place], [address] Phone: [phone]   Fax:  [phone]  Physical Therapy Treatment and Discharge Summary  Patient Details  Name: Benjamin Leonard MRN: [id number] Date of Birth: 06/17/1966 Referring Provider (PT): Silverio Decamp, MD   Encounter Date: 09/03/2019  PT End of Session - 09/03/19 1536    Visit Number  3    Number of Visits  8    Date for PT Re-Evaluation  10/11/19    PT Start Time  1450    PT Stop Time  1529    PT Time Calculation (min)  39 min       Past Medical History:  Diagnosis Date  . Borderline hypertension   . Hearing loss    Right ear w/ hearing aid (new one in 2008)  . OAB (overactive bladder)    or BPH?  Marland Kitchen Ulcerative colitis     Past Surgical History:  Procedure Laterality Date  . BICEPS TENDON REPAIR Right 2016  . RHINOPLASTY  1997  . right ear surgery for scar tissue removal  1996    There were no vitals filed for this visit.  Subjective Assessment - 09/03/19 1542    Subjective  Pt reports significant improvement since last session.  He states he has no pain and is able to move his thumb much better.    Patient Stated Goals  Return to work and activities without pain.    Currently in Pain?  No/denies         [facility] PT Assessment - 09/03/19 0001      Assessment   Medical Diagnosis  L wrist intersection syndrome    Referring Provider (PT)  Silverio Decamp, MD    Hand Dominance  Right    Next MD Visit  09/18/2019      Observation/Other Assessments   Focus on Therapeutic Outcomes (FOTO)   74% (26% limitation)      Strength   Overall Strength Comments  L thumb IP extension is 5/5, composite thumb extension-- patient uses MCP hyperextension to gain motion.     Left Wrist Radial Deviation  5/5    Right Hand Grip (lbs)  125    Left Hand Grip (lbs)  102        [facility] Adult PT Treatment/Exercise - 09/03/19 0001      Exercises    Exercises  Wrist;Hand      Hand Exercises   Other Hand Exercises  medium grip ball squeezes followed by thumb/finger ext x 20 reps;  thumb pressing down on hand grip x 10 reps;  Lt clamp grip x 5 reps      Wrist Exercises   Wrist Extension  Strengthening;Left;10 reps;Seated    Bar Weights/Barbell (Wrist Extension)  3 lbs    Wrist Radial Deviation  Left;5 reps;10 reps    Bar Weights/Barbell (Radial Deviation)  3 lbs;2 lbs    Wrist Radial Deviation Limitations  3# too much, began to be painful; switched to 2# with improved tolerance.     Other wrist exercises  pronation/supination with 3# x 10    Other wrist exercises  reviewed radial nerve glides, pt performed 1 rep of each.   Finklestein stretch x 10 sec x 3 reps;  Lt pronation with wrist flexion stretch x 15 sec       Iontophoresis   Type of Iontophoresis  Dexamethasone    Location  L EPL tendon    Dose  1.0 cc    Time  70m stat patch (6  hr wear time)      Manual Therapy   Manual Therapy  Soft tissue mobilization    Soft tissue mobilization  IASTM to Lt radiobrachialis, wrist flexors and extensors, area around anatomical snuffbox, and thenar emminance to decrease fascial restrictions and improve mobility.                   PT Long Term Goals - 09/03/19 1538      PT LONG TERM GOAL #1   Title  The patient will be indep with HEP.    Time  6    Period  Weeks    Status  Partially Met      PT LONG TERM GOAL #2   Title  The patient will reduce functional limitation per FOTO to < or equal to 20%    Time  6    Period  Weeks    Status  On-going      PT LONG TERM GOAL #3   Title  The patient will return to work without increased L wrist pain.    Baseline  pt scheduled to return 09/08/19    Time  6    Period  Weeks    Status  On-going      PT LONG TERM GOAL #4   Title  The patient will improve L IP extension to 5/5 strength.    Time  6    Period  Weeks    Status  Achieved            Plan - 09/03/19 1539     Clinical Impression Statement  Much improved Lt thumb and wrist strength from last assessment; grip strength gradually improving.  Area of EPL at distal wrist still has mild swelling; repeated ionto patch to area today.  Pt verbalized readiness to hold therapy and return to work.  Pt has partially met his goals.    Personal Factors and Comorbidities  Comorbidity 1    Comorbidities  recent L shoulder surgery    Examination-Activity Limitations  Lift    Stability/Clinical Decision Making  Stable/Uncomplicated    Rehab Potential  Good    PT Frequency  2x / week   anticipate decrease to 1x/week after initial HEP established.   PT Duration  6 weeks    PT Treatment/Interventions  ADLs/Self Care Home Management;Iontophoresis 420mml Dexamethasone;Cryotherapy;Ultrasound;Therapeutic exercise;Therapeutic activities;Patient/family education;Manual techniques;Taping;Splinting    PT Next Visit Plan  hold therapy for 30 days, in case pt has flare up upon return to work.  (10/01/19)    Consulted and Agree with Plan of Care  Patient       Patient will benefit from skilled therapeutic intervention in order to improve the following deficits and impairments:  Pain, Decreased strength, Impaired flexibility  Visit Diagnosis: Pain in left wrist  Muscle weakness (generalized)     Problem List Patient Active Problem List   Diagnosis Date Noted  . Swelling of left wrist 08/13/2019  . Blepharitis of both eyes 08/13/2019  . Dry eye syndrome 08/13/2019  . Bilateral hand swelling 02/23/2019  . Primary osteoarthritis of both hands 02/09/2019  . Allergic conjunctivitis 02/09/2019  . Folliculitis 0816/38/4665. Chronic foot pain, right 12/27/2017  . Calf swelling 12/06/2017  . Right patellofemoral syndrome 04/12/2016  . Hyperlipidemia 01/10/2016  . Degenerative disc disease, cervical 01/10/2016  . Cough 08/05/2015  . Anxiety and depression 02/15/2015  . Left Achilles tendinitis 01/18/2015  .  Right lumbar  radiculitis 09/30/2014  . Hiatal hernia 09/03/2014  . Hemangioma of liver 08/06/2014  . Exertional chest pain 06/22/2014  . Urinary obstruction 12/08/2013  . Right distal Biceps tendon tear 12/04/2012  . Annual physical exam 03/08/2012  . Fatigue 10/19/2010  . Erectile dysfunction 10/19/2010  . Perennial allergic rhinitis 10/19/2010  . COLITIS, ULCERATIVE NOS 05/27/2007    PHYSICAL THERAPY DISCHARGE SUMMARY  Visits from Start of Care: 3  Current functional level related to goals / functional outcomes: See above.   Remaining deficits: See goals above   Education / Equipment: HEP  Plan: Patient agrees to discharge.  Patient goals were partially met. Patient is being discharged due to not returning since the last visit.  ?????         Thank you for the referral of this patient. Rudell Cobb, MPT    Kerin Perna, [place] 09/03/19 3:46 PM  [facility] [address] [place], [place], [address] Phone: [phone]   Fax:  [phone]  Name: MATTHEWJAMES PETRASEK MRN: [id number] Date of Birth: 1965-10-12

## 2019-09-18 ENCOUNTER — Ambulatory Visit: Payer: BC Managed Care – PPO | Admitting: Sports Medicine

## 2019-10-14 ENCOUNTER — Ambulatory Visit (INDEPENDENT_AMBULATORY_CARE_PROVIDER_SITE_OTHER): Payer: BC Managed Care – PPO | Admitting: Sports Medicine

## 2019-10-14 ENCOUNTER — Other Ambulatory Visit: Payer: Self-pay

## 2019-10-14 DIAGNOSIS — R102 Pelvic and perineal pain unspecified side: Secondary | ICD-10-CM | POA: Insufficient documentation

## 2019-10-14 MED ORDER — NITROFURANTOIN MONOHYD MACRO 100 MG PO CAPS: 100.0000 mg | ORAL_CAPSULE | Freq: Two times a day (BID) | ORAL | 0 refills | Status: DC

## 2019-10-14 NOTE — Progress Notes (Signed)
    Procedures performed today:    None.  Independent interpretation of notes and tests performed by another provider:   None.  Impression and Recommendations:    Acute pelvic pain Has a long history of pelvic pain, pelvic floor dysfunction, he has seen a couple of urologists and has had a cystoscopy. In fact he tried to get in with to urologist but they are booked out pretty far. Today his symptoms sound like prostatitis, perineal fullness, weak stream, overall malaise without fevers or chills. CBC, CMP, amylase, lipase, PSA, urinalysis with urine culture. Adding Macrobid, he does not tolerate fluoroquinolones well. He does have some Rapaflo at home which I would like him to start taking. If insufficient improvement at the 2 to 3-week point we will do a prostate MRI.    ___________________________________________ Gwen Her. Dianah Field, M.D., ABFM., CAQSM. Primary Care and [place] Instructor of [place] of [facility] of Medicine

## 2019-10-14 NOTE — Assessment & Plan Note (Signed)
Has a long history of pelvic pain, pelvic floor dysfunction, he has seen a couple of urologists and has had a cystoscopy. In fact he tried to get in with to urologist but they are booked out pretty far. Today his symptoms sound like prostatitis, perineal fullness, weak stream, overall malaise without fevers or chills. CBC, CMP, amylase, lipase, PSA, urinalysis with urine culture. Adding Macrobid, he does not tolerate fluoroquinolones well. He does have some Rapaflo at home which I would like him to start taking. If insufficient improvement at the 2 to 3-week point we will do a prostate MRI.

## 2019-10-15 LAB — CBC WITH DIFFERENTIAL/PLATELET
Absolute Monocytes: 554 cells/uL (ref 200–950)
Basophils Absolute: 21 cells/uL (ref 0–200)
Basophils Relative: 0.5 %
Eosinophils Absolute: 70 cells/uL (ref 15–500)
Eosinophils Relative: 1.7 %
HCT: 45.8 % (ref 38.5–50.0)
Hemoglobin: 15.7 g/dL (ref 13.2–17.1)
Lymphs Abs: 1214 cells/uL (ref 850–3900)
MCH: 31.5 pg (ref 27.0–33.0)
MCHC: 34.3 g/dL (ref 32.0–36.0)
MCV: 92 fL (ref 80.0–100.0)
MPV: 9.2 fL (ref 7.5–12.5)
Monocytes Relative: 13.5 %
Neutro Abs: 2243 cells/uL (ref 1500–7800)
Neutrophils Relative %: 54.7 %
Platelets: 277 10*3/uL (ref 140–400)
RBC: 4.98 10*6/uL (ref 4.20–5.80)
RDW: 12.1 % (ref 11.0–15.0)
Total Lymphocyte: 29.6 %
WBC: 4.1 10*3/uL (ref 3.8–10.8)

## 2019-10-15 LAB — URINALYSIS W MICROSCOPIC + REFLEX CULTURE
Bacteria, UA: NONE SEEN /HPF
Bilirubin Urine: NEGATIVE
Glucose, UA: NEGATIVE
Hgb urine dipstick: NEGATIVE
Hyaline Cast: NONE SEEN /LPF
Ketones, ur: NEGATIVE
Leukocyte Esterase: NEGATIVE
Nitrites, Initial: NEGATIVE
Protein, ur: NEGATIVE
RBC / HPF: NONE SEEN /HPF (ref 0–2)
Specific Gravity, Urine: 1.026 (ref 1.001–1.03)
Squamous Epithelial / HPF: NONE SEEN /HPF (ref <=5)
WBC, UA: NONE SEEN /HPF (ref 0–5)
pH: 5.5 (ref 5.0–8.0)

## 2019-10-15 LAB — COMPLETE METABOLIC PANEL WITH GFR
AG Ratio: 2.1 (calc) (ref 1.0–2.5)
ALT: 22 U/L (ref 9–46)
AST: 22 U/L (ref 10–35)
Albumin: 4.4 g/dL (ref 3.6–5.1)
Alkaline phosphatase (APISO): 61 U/L (ref 35–144)
BUN: 17 mg/dL (ref 7–25)
CO2: 26 mmol/L (ref 20–32)
Calcium: 9.3 mg/dL (ref 8.6–10.3)
Chloride: 103 mmol/L (ref 98–110)
Creat: 1.09 mg/dL (ref 0.70–1.33)
GFR, Est African American: 88 mL/min/{1.73_m2} (ref >=60)
GFR, Est Non African American: 76 mL/min/{1.73_m2} (ref >=60)
Globulin: 2.1 g/dL (calc) (ref 1.9–3.7)
Glucose, Bld: 81 mg/dL (ref 65–139)
Potassium: 4.6 mmol/L (ref 3.5–5.3)
Sodium: 141 mmol/L (ref 135–146)
Total Bilirubin: 0.6 mg/dL (ref 0.2–1.2)
Total Protein: 6.5 g/dL (ref 6.1–8.1)

## 2019-10-15 LAB — PSA, TOTAL AND FREE
PSA, % Free: 40 % (calc) (ref >25)
PSA, Free: 0.2 ng/mL
PSA, Total: 0.5 ng/mL (ref <=4.0)

## 2019-10-15 LAB — LIPASE: Lipase: 40 U/L (ref 7–60)

## 2019-10-15 LAB — AMYLASE: Amylase: 36 U/L (ref 21–101)

## 2019-10-15 LAB — NO CULTURE INDICATED

## 2019-10-22 DIAGNOSIS — R399 Unspecified symptoms and signs involving the genitourinary system: Secondary | ICD-10-CM | POA: Insufficient documentation

## 2019-10-23 DIAGNOSIS — M51369 Other intervertebral disc degeneration, lumbar region without mention of lumbar back pain or lower extremity pain: Secondary | ICD-10-CM

## 2019-10-23 DIAGNOSIS — M5136 Other intervertebral disc degeneration, lumbar region: Secondary | ICD-10-CM

## 2019-10-23 NOTE — Telephone Encounter (Signed)
Routing to provider  

## 2019-10-24 NOTE — Assessment & Plan Note (Signed)
Benjamin Leonard did well after a bilateral L4-L5 transforaminal epidural in 2019, now with return of back pain, ordering repeat bilateral L4-L5 transforaminal epidural, return as needed.

## 2019-10-24 NOTE — Telephone Encounter (Signed)
Bilateral L4-L5 transforaminal epidural ordered [facility], please contact [facility] for scheduling.

## 2019-10-26 NOTE — Telephone Encounter (Signed)
MRN given to [facility] for scheduling.

## 2019-10-30 ENCOUNTER — Other Ambulatory Visit: Payer: Self-pay | Admitting: Sports Medicine

## 2019-10-30 ENCOUNTER — Other Ambulatory Visit: Payer: Self-pay

## 2019-10-30 ENCOUNTER — Ambulatory Visit
Admission: RE | Admit: 2019-10-30 | Discharge: 2019-10-30 | Disposition: A | Discharge status: Home / Self Care | Payer: BC Managed Care – PPO | Source: Ambulatory Visit | Attending: Sports Medicine | Admitting: Sports Medicine

## 2019-10-30 DIAGNOSIS — M5136 Other intervertebral disc degeneration, lumbar region: Secondary | ICD-10-CM

## 2019-10-30 MED ORDER — METHYLPREDNISOLONE ACETATE 40 MG/ML INJ SUSP (RADIOLOG
120.0000 mg | Freq: Once | INTRAMUSCULAR | Status: AC
  Administered 2019-10-30: 120 mg via EPIDURAL

## 2019-10-30 MED ORDER — IOPAMIDOL (ISOVUE-M 200) INJECTION 41%
1.0000 mL | Freq: Once | INTRAMUSCULAR | Status: AC
  Administered 2019-10-30: 1 mL via EPIDURAL

## 2019-10-30 NOTE — Discharge Instructions (Signed)

## 2019-11-02 ENCOUNTER — Ambulatory Visit (INDEPENDENT_AMBULATORY_CARE_PROVIDER_SITE_OTHER): Payer: BC Managed Care – PPO

## 2019-11-02 ENCOUNTER — Ambulatory Visit (INDEPENDENT_AMBULATORY_CARE_PROVIDER_SITE_OTHER): Payer: BC Managed Care – PPO | Admitting: Sports Medicine

## 2019-11-02 ENCOUNTER — Other Ambulatory Visit: Payer: Self-pay

## 2019-11-02 DIAGNOSIS — M5136 Other intervertebral disc degeneration, lumbar region: Secondary | ICD-10-CM | POA: Diagnosis not present

## 2019-11-02 DIAGNOSIS — M51369 Other intervertebral disc degeneration, lumbar region without mention of lumbar back pain or lower extremity pain: Secondary | ICD-10-CM

## 2019-11-02 NOTE — Progress Notes (Addendum)
    Procedures performed today:    None.  Independent interpretation of notes and tests performed by another provider:   None.  Brief History, Exam, Impression, and Recommendations:    Lumbar degenerative disc disease Jesi returns, he recently had a bilateral L4-L5 transforaminal epidural, last done 2019 prior with fantastic results. His back pain is improved but after the epidural he started to notice progressive weakness of his right ankle now with relative foot drop. He has significant weakness to dorsiflexion of the right foot with a high-stepping/slapping gait. Because of his recent spine intervention we are going to proceed with an MRI of the lumbar spine today looking for hematoma or CSF leak.  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  He has done well with conservative treatment in the past including lumbar epidurals.  More recently he received a L4-L5 transforaminal epidural bilateral, he did have some significant and progressive right sided foot drop so we obtained another stat MRI.  It did not show any evidence of hematoma or CSF leak, it did confirm the existing lumbar spinal stenosis, severe at L4-L5.  Unfortunately he has not noted any improvement since his symptoms, now 11 days post injection.  He still has some foot drop on the right side although it is a bit better, at this point it is reasonable to proceed with surgical consultation, he and I discussed this at length on the phone, and that he would likely need a decompressive procedure at least at the L4-L5 level.  He is agreeable to proceed.    ___________________________________________ Gwen Her. Dianah Field, M.D., ABFM., CAQSM. Primary Care and [place] Instructor of [place] of [facility] of Medicine

## 2019-11-02 NOTE — Assessment & Plan Note (Addendum)
Benjamin Leonard returns, he recently had a bilateral L4-L5 transforaminal epidural, last done 2019 prior with fantastic results. His back pain is improved but after the epidural he started to notice progressive weakness of his right ankle now with relative foot drop. He has significant weakness to dorsiflexion of the right foot with a high-stepping/slapping gait. Because of his recent spine intervention we are going to proceed with an MRI of the lumbar spine today looking for hematoma or CSF leak.  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  He has done well with conservative treatment in the past including lumbar epidurals.  More recently he received a L4-L5 transforaminal epidural bilateral, he did have some significant and progressive right sided foot drop so we obtained another stat MRI.  It did not show any evidence of hematoma or CSF leak, it did confirm the existing lumbar spinal stenosis, severe at L4-L5.  Unfortunately he has not noted any improvement since his symptoms, now 11 days post injection.  He still has some foot drop on the right side although it is a bit better, at this point it is reasonable to proceed with surgical consultation, he and I discussed this at length on the phone, and that he would likely need a decompressive procedure at least at the L4-L5 level.  He is agreeable to proceed.

## 2019-11-04 ENCOUNTER — Ambulatory Visit: Payer: BC Managed Care – PPO | Admitting: Sports Medicine

## 2019-11-10 ENCOUNTER — Telehealth: Payer: Self-pay | Admitting: Sports Medicine

## 2019-11-10 NOTE — Telephone Encounter (Signed)
Routing to provider  

## 2019-11-10 NOTE — Addendum Note (Signed)
Addended by: Silverio Decamp on: 11/10/2019 05:27 PM   Modules accepted: Orders

## 2019-11-10 NOTE — Telephone Encounter (Signed)
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  He has done well with conservative treatment in the past including lumbar epidurals.  More recently he received a L4-L5 transforaminal epidural bilateral, he did have some significant and progressive right sided foot drop so we obtained another stat MRI.  It did not show any evidence of hematoma or CSF leak, it did confirm the existing lumbar spinal stenosis, severe at L4-L5.  Unfortunately he has not noted any improvement since his symptoms, now 11 days post injection.  He still has some foot drop on the right side although it is a bit better, at this point it is reasonable to proceed with surgical consultation, he and I discussed this at length on the phone, and that he would likely need a decompressive procedure at least at the L4-L5 level.  He is agreeable to proceed.

## 2020-02-17 IMAGING — US US EXTREM LOW VENOUS*R*
1 series · 13 of 24 positions shown · non-contrast
Comparison: None.

CLINICAL DATA: Right lower extremity pain and edema for the past 2
weeks after exercising. Evaluate for DVT.



[Series 1: us extrem low venous*right* · 0.07mm/px · 13 of 29 slices shown]
[im 1/29]
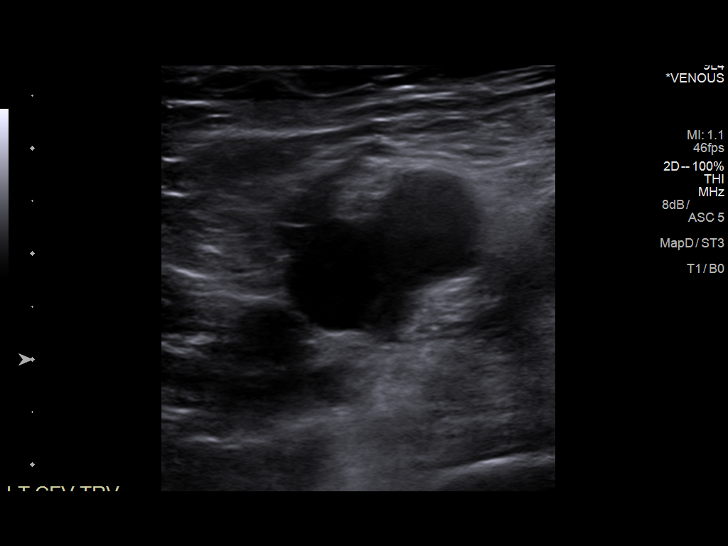
[im 3/29]
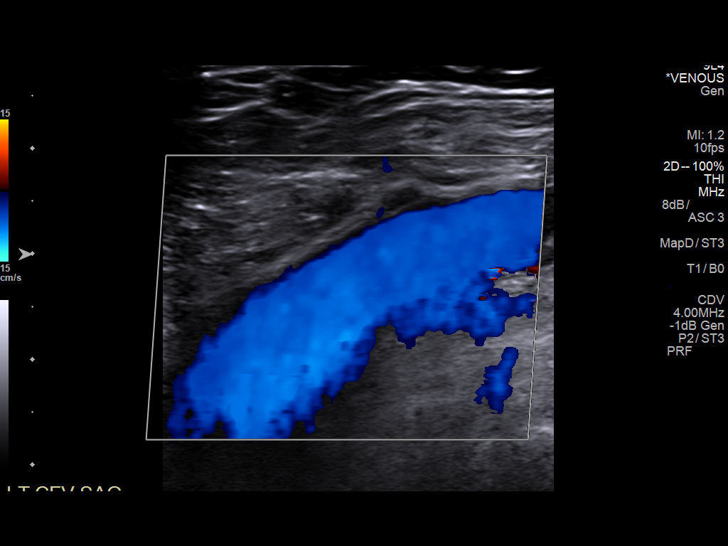
[im 5/29]
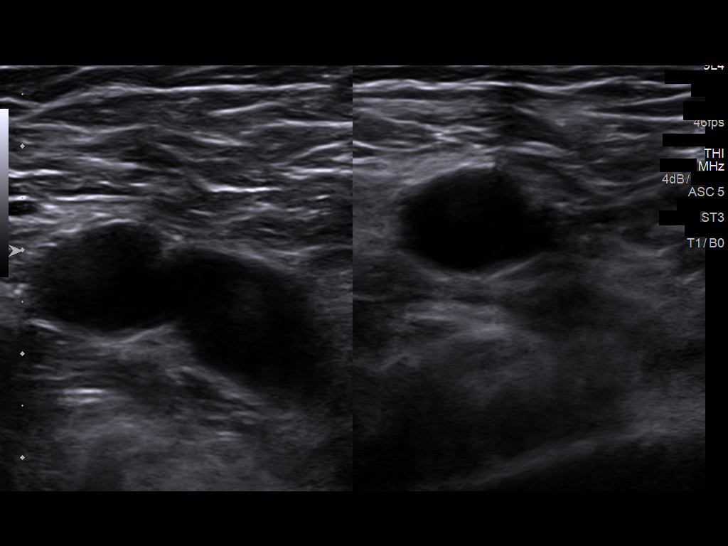
[im 8/29]
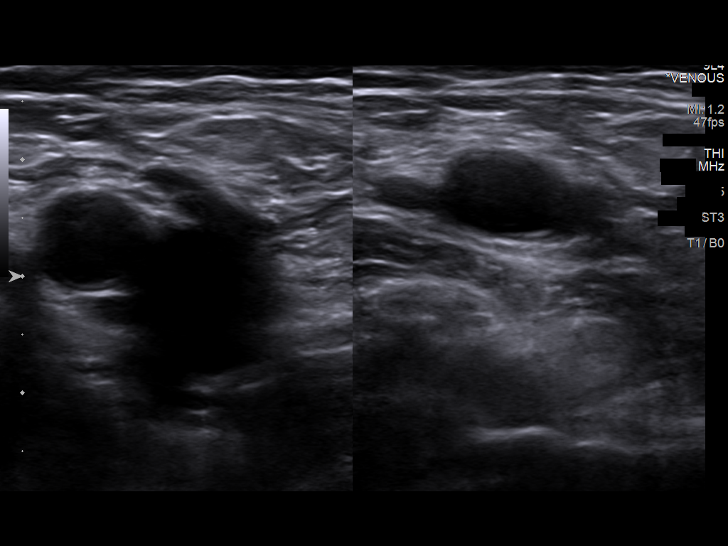
[im 10/29]
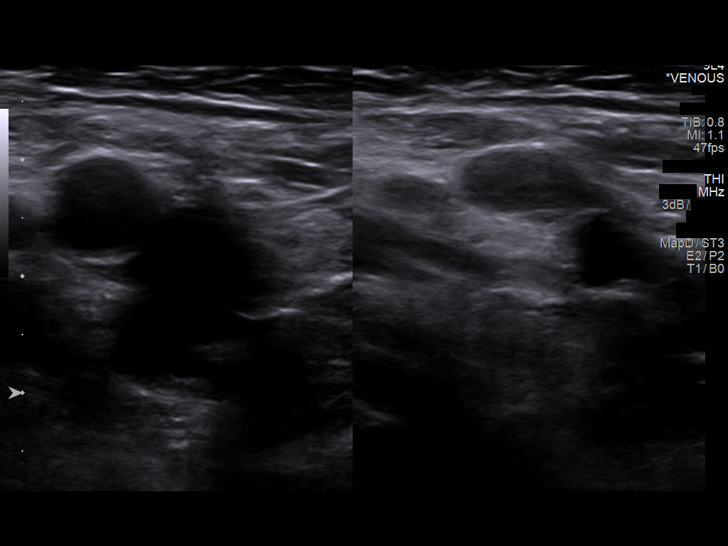
[im 13/29]
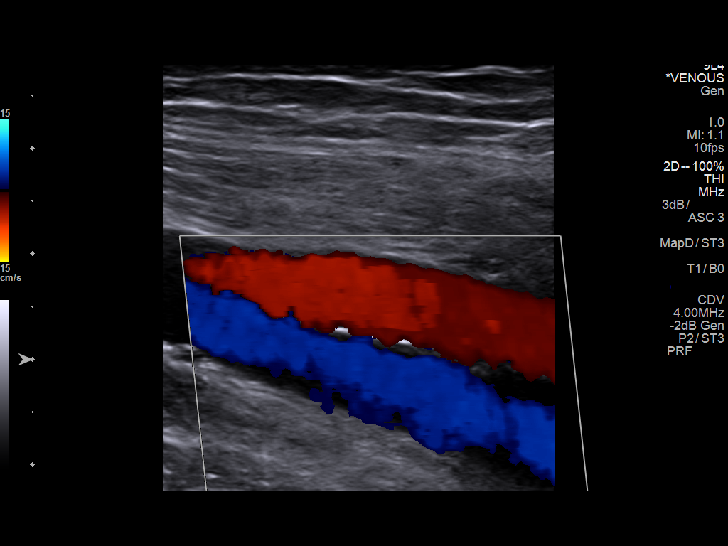
[im 15/29]
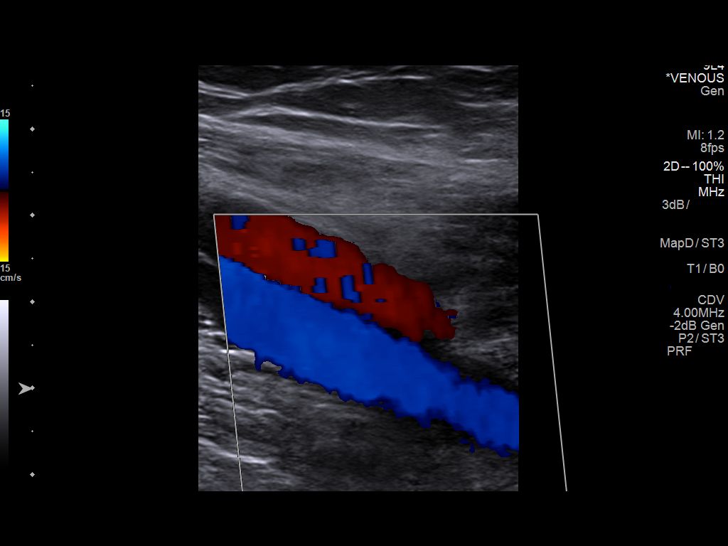
[im 16/29]
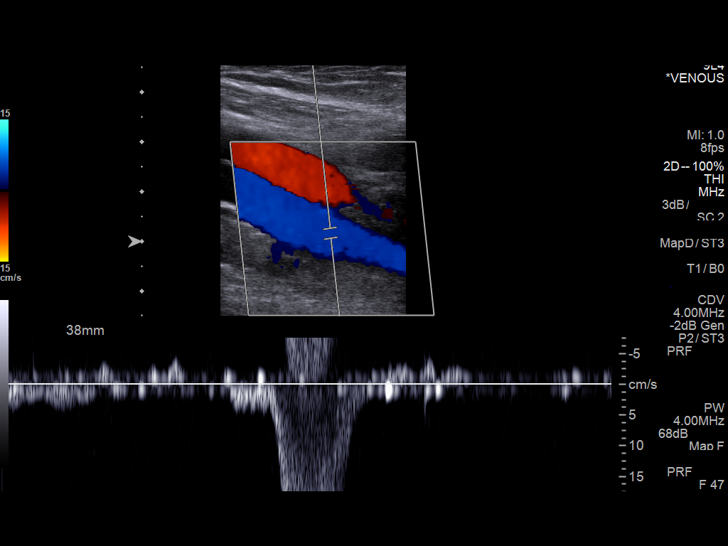
[im 19/29]
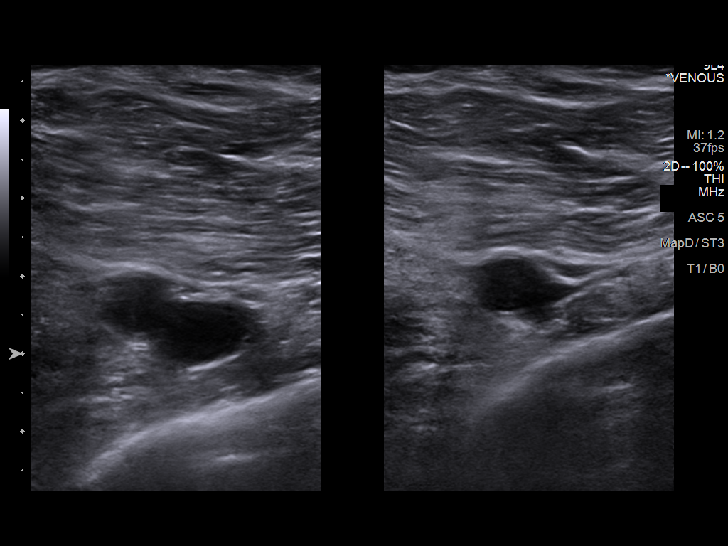
[im 21/29]
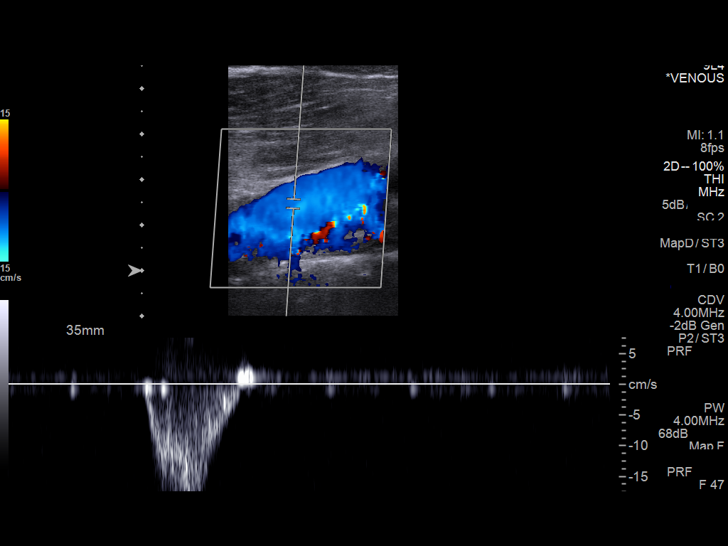
[im 24/29]
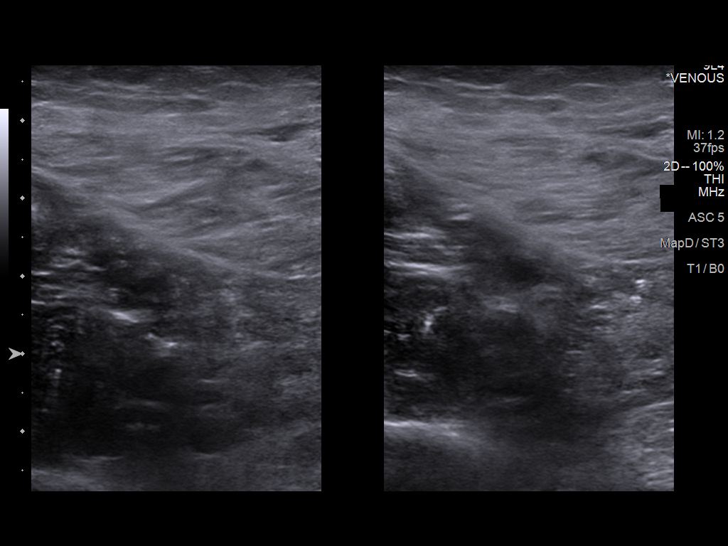
[im 26/29]
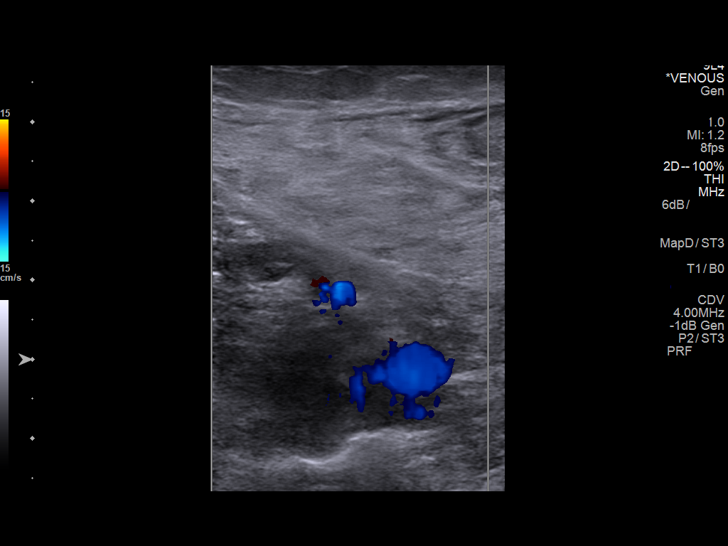
[im 29/29]
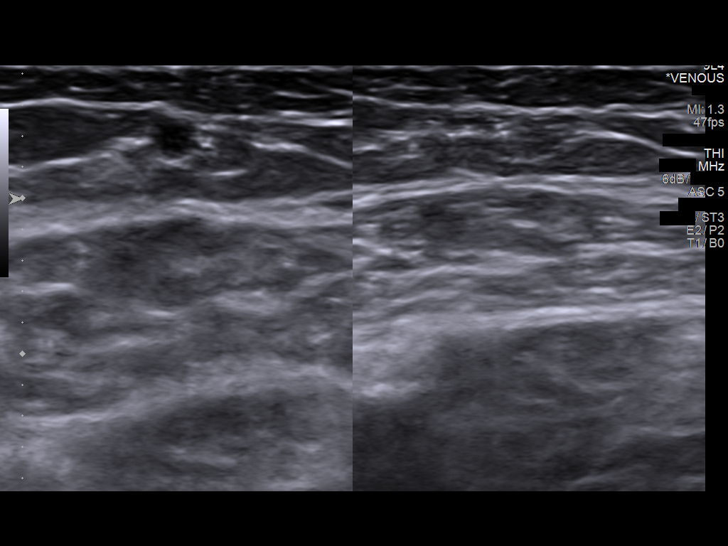

[13 of 24 positions shown; findings below may reference images not displayed]

FINDINGS: Contralateral Common Femoral Vein: Respiratory phasicity is normal
and symmetric with the symptomatic side. No evidence of thrombus.
Normal compressibility.

Common Femoral Vein: No evidence of thrombus. Normal
compressibility, respiratory phasicity and response to augmentation.

Saphenofemoral Junction: No evidence of thrombus. Normal
compressibility and flow on color Doppler imaging.

Profunda Femoral Vein: No evidence of thrombus. Normal
compressibility and flow on color Doppler imaging.

Femoral Vein: No evidence of thrombus. Normal compressibility,
respiratory phasicity and response to augmentation.

Popliteal Vein: No evidence of thrombus. Normal compressibility,
respiratory phasicity and response to augmentation.

Calf Veins: No evidence of thrombus. Normal compressibility and flow
on color Doppler imaging.

Superficial Great Saphenous Vein: No evidence of thrombus. Normal
compressibility.

Venous Reflux:  None.

Other Findings:  None.
IMPRESSION: No evidence of DVT within the right lower extremity.

## 2020-03-29 DIAGNOSIS — E785 Hyperlipidemia, unspecified: Secondary | ICD-10-CM

## 2020-03-29 DIAGNOSIS — N139 Obstructive and reflux uropathy, unspecified: Secondary | ICD-10-CM

## 2020-04-06 MED ORDER — CELECOXIB 200 MG PO CAPS: ORAL_CAPSULE | ORAL | 2 refills | Status: DC

## 2020-04-06 NOTE — Addendum Note (Signed)
Addended by: Silverio Decamp on: 04/06/2020 05:03 PM   Modules accepted: Orders

## 2020-04-08 LAB — COMPLETE METABOLIC PANEL WITH GFR
AG Ratio: 1.8 (calc) (ref 1.0–2.5)
ALT: 20 U/L (ref 9–46)
AST: 21 U/L (ref 10–35)
Albumin: 4.3 g/dL (ref 3.6–5.1)
Alkaline phosphatase (APISO): 60 U/L (ref 35–144)
BUN: 16 mg/dL (ref 7–25)
CO2: 30 mmol/L (ref 20–32)
Calcium: 9.2 mg/dL (ref 8.6–10.3)
Chloride: 101 mmol/L (ref 98–110)
Creat: 1.15 mg/dL (ref 0.70–1.33)
GFR, Est African American: 83 mL/min/{1.73_m2} (ref >=60)
GFR, Est Non African American: 71 mL/min/{1.73_m2} (ref >=60)
Globulin: 2.4 g/dL (calc) (ref 1.9–3.7)
Glucose, Bld: 87 mg/dL (ref 65–99)
Potassium: 4.5 mmol/L (ref 3.5–5.3)
Sodium: 139 mmol/L (ref 135–146)
Total Bilirubin: 0.6 mg/dL (ref 0.2–1.2)
Total Protein: 6.7 g/dL (ref 6.1–8.1)

## 2020-04-08 LAB — CBC
HCT: 44.7 % (ref 38.5–50.0)
Hemoglobin: 15.2 g/dL (ref 13.2–17.1)
MCH: 31 pg (ref 27.0–33.0)
MCHC: 34 g/dL (ref 32.0–36.0)
MCV: 91 fL (ref 80.0–100.0)
MPV: 9.2 fL (ref 7.5–12.5)
Platelets: 255 10*3/uL (ref 140–400)
RBC: 4.91 10*6/uL (ref 4.20–5.80)
RDW: 12.2 % (ref 11.0–15.0)
WBC: 3.7 10*3/uL — ABNORMAL LOW (ref 3.8–10.8)

## 2020-04-08 LAB — LIPID PANEL W/REFLEX DIRECT LDL
Cholesterol: 245 mg/dL — ABNORMAL HIGH (ref <200)
HDL: 56 mg/dL (ref >=40)
LDL Cholesterol (Calc): 164 mg/dL (calc) — ABNORMAL HIGH
Non-HDL Cholesterol (Calc): 189 mg/dL (calc) — ABNORMAL HIGH (ref <130)
Total CHOL/HDL Ratio: 4.4 (calc) (ref <5.0)
Triglycerides: 123 mg/dL (ref <150)

## 2020-04-08 LAB — PSA, TOTAL AND FREE
PSA, % Free: 60 % (calc) (ref >25)
PSA, Free: 0.3 ng/mL
PSA, Total: 0.5 ng/mL (ref <=4.0)

## 2020-04-08 LAB — TSH: TSH: 2.14 mIU/L (ref 0.40–4.50)

## 2020-04-13 ENCOUNTER — Other Ambulatory Visit: Payer: Self-pay

## 2020-04-13 ENCOUNTER — Ambulatory Visit (INDEPENDENT_AMBULATORY_CARE_PROVIDER_SITE_OTHER): Payer: BC Managed Care – PPO | Admitting: Sports Medicine

## 2020-04-13 ENCOUNTER — Ambulatory Visit (INDEPENDENT_AMBULATORY_CARE_PROVIDER_SITE_OTHER): Payer: BC Managed Care – PPO

## 2020-04-13 DIAGNOSIS — Z23 Encounter for immunization: Secondary | ICD-10-CM | POA: Diagnosis not present

## 2020-04-13 DIAGNOSIS — M48061 Spinal stenosis, lumbar region without neurogenic claudication: Secondary | ICD-10-CM

## 2020-04-13 DIAGNOSIS — J209 Acute bronchitis, unspecified: Secondary | ICD-10-CM

## 2020-04-13 MED ORDER — AZITHROMYCIN 250 MG PO TABS: ORAL_TABLET | ORAL | 0 refills | Status: DC

## 2020-04-13 NOTE — Progress Notes (Signed)
    Procedures performed today:    None.  Independent interpretation of notes and tests performed by another provider:   None.  Brief History, Exam, Impression, and Recommendations:    Lumbar spinal stenosis Rashee is severe lumbar spinal stenosis at the L4-L5 level with what sounds to be mild instability. He has had physical therapy, several epidurals, neuropathic agents, NSAIDs. Continues to have pain. Ultimately we got a second opinion from Dr. Lynann Bologna who did suggest surgical intervention in the form of what sounds to be a posterior fusion. Vernel was interested in XLIF and minimally invasive decompressions. I went over all of his imaging, I would like him to get a second opinion from Dr. Ronnald Ramp with [place] [facility], and I did advise him that they would be likely better at explaining the specifics of the various procedures.  Acute bronchitis Saint has had 2 weeks of a minimally productive cough, no fevers, chills, overall malaise. He did have a recent Covid test that was negative. His exam is unremarkable, he was has a bit of hoarse voice, lungs are clear. 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 Return to see me in 2 to 4 weeks for reevaluation.    ___________________________________________ Gwen Her. Dianah Field, M.D., ABFM., CAQSM. Primary Care and [place] Instructor of [place] of [facility] of Medicine

## 2020-04-13 NOTE — Assessment & Plan Note (Signed)
Benjamin Leonard has had 2 weeks of a minimally productive cough, no fevers, chills, overall malaise. He did have a recent Covid test that was negative. His exam is unremarkable, he was has a bit of hoarse voice, lungs are clear. 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 Return to see me in 2 to 4 weeks for reevaluation.

## 2020-04-13 NOTE — Addendum Note (Signed)
Addended by: Dema Severin on: 04/13/2020 10:36 AM   Modules accepted: Orders

## 2020-04-13 NOTE — Assessment & Plan Note (Signed)
Benjamin Leonard is severe lumbar spinal stenosis at the L4-L5 level with what sounds to be mild instability. He has had physical therapy, several epidurals, neuropathic agents, NSAIDs. Continues to have pain. Ultimately we got a second opinion from Dr. Lynann Bologna who did suggest surgical intervention in the form of what sounds to be a posterior fusion. [place] was interested in XLIF and minimally invasive decompressions. I went over all of his imaging, I would like him to get a second opinion from Dr. Ronnald Ramp with [place] [facility], and I did advise him that they would be likely better at explaining the specifics of the various procedures.

## 2020-05-11 ENCOUNTER — Ambulatory Visit: Payer: BC Managed Care – PPO | Admitting: Sports Medicine

## 2020-07-11 ENCOUNTER — Ambulatory Visit (INDEPENDENT_AMBULATORY_CARE_PROVIDER_SITE_OTHER): Payer: BC Managed Care – PPO | Admitting: Sports Medicine

## 2020-07-11 ENCOUNTER — Other Ambulatory Visit: Payer: Self-pay

## 2020-07-11 ENCOUNTER — Ambulatory Visit (INDEPENDENT_AMBULATORY_CARE_PROVIDER_SITE_OTHER): Payer: BC Managed Care – PPO

## 2020-07-11 DIAGNOSIS — M19042 Primary osteoarthritis, left hand: Secondary | ICD-10-CM

## 2020-07-11 DIAGNOSIS — M19041 Primary osteoarthritis, right hand: Secondary | ICD-10-CM

## 2020-07-11 NOTE — Progress Notes (Signed)
    Procedures performed today:    Procedure: Real-time Ultrasound Guided injection of the right first [facility] Device: Samsung HS60  Verbal informed consent obtained.  Time-out conducted.  Noted no overlying erythema, induration, or other signs of local infection.  Skin prepped in a sterile fashion.  Local anesthesia: Topical Ethyl chloride.  With sterile technique and under real time ultrasound guidance:  1/2 cc lidocaine, 1/2 cc kenalog 40 injected easily  completed without difficulty  Advised to call if fevers/chills, erythema, induration, drainage, or persistent bleeding.  Images permanently stored and available for review in PACS.  Impression: Technically successful ultrasound guided injection.  Independent interpretation of notes and tests performed by another provider:   None.  Brief History, Exam, Impression, and Recommendations:    Primary osteoarthritis of both hands Increasing pain in the base of the right thumb, pain is referable to the first [facility], negative Finkelstein sign. Oral NSAIDs not effective, first [facility] injection today, return to see me in a month.    ___________________________________________ Gwen Her. Dianah Field, M.D., ABFM., CAQSM. Primary Care and [place] Instructor of [place] of [facility] of Medicine

## 2020-07-11 NOTE — Assessment & Plan Note (Signed)
Increasing pain in the base of the right thumb, pain is referable to the first [facility], negative Finkelstein sign. Oral NSAIDs not effective, first [facility] injection today, return to see me in a month.

## 2020-08-08 ENCOUNTER — Ambulatory Visit: Payer: BC Managed Care – PPO | Admitting: Sports Medicine

## 2020-12-09 ENCOUNTER — Other Ambulatory Visit: Payer: Self-pay

## 2020-12-09 ENCOUNTER — Ambulatory Visit: Payer: BC Managed Care – PPO | Admitting: Sports Medicine

## 2020-12-09 DIAGNOSIS — U071 COVID-19: Secondary | ICD-10-CM

## 2020-12-09 DIAGNOSIS — S8992XA Unspecified injury of left lower leg, initial encounter: Secondary | ICD-10-CM

## 2020-12-09 NOTE — Progress Notes (Signed)
    Procedures performed today:    The left knee was strapped with a compressive dressing.  Independent interpretation of notes and tests performed by another provider:   None.  Brief History, Exam, Impression, and Recommendations:    Left knee injury This is a pleasant 55 year old male, 2 weeks ago he was on the elliptical, he took a misstep and hyperextended his left knee, he had some immediate pain posteriorly, but the knee never swelled up. He returns today feeling a little bit better, but just wanted to make sure he has not done any serious injury. On exam he does not have an effusion, his knee is stable with regards to ACL, MCL, PCL, LCL, negative McMurray's sign, full range of motion including terminal flexion without pain, no fluid wave, he does have mild tenderness over the proximal medial head of gastrocnemius. I think he simply has a gastroc strain, I strapped his knee with a compressive dressing and he can return to see me on an as-needed basis.    ___________________________________________ Gwen Her. Dianah Field, M.D., ABFM., CAQSM. Primary Care and [place] Instructor of [place] of [facility] of Medicine

## 2020-12-09 NOTE — Assessment & Plan Note (Signed)
This is a pleasant 55 year old male, 2 weeks ago he was on the elliptical, he took a misstep and hyperextended his left knee, he had some immediate pain posteriorly, but the knee never swelled up. He returns today feeling a little bit better, but just wanted to make sure he has not done any serious injury. On exam he does not have an effusion, his knee is stable with regards to ACL, MCL, PCL, LCL, negative McMurray's sign, full range of motion including terminal flexion without pain, no fluid wave, he does have mild tenderness over the proximal medial head of gastrocnemius. I think he simply has a gastroc strain, I strapped his knee with a compressive dressing and he can return to see me on an as-needed basis.

## 2020-12-12 LAB — HM COLONOSCOPY

## 2020-12-23 DIAGNOSIS — U071 COVID-19: Secondary | ICD-10-CM | POA: Insufficient documentation

## 2020-12-23 MED ORDER — PAXLOVID 20 X 150 MG & 10 X 100MG PO TBPK: 3.0000 | ORAL_TABLET | Freq: Two times a day (BID) | ORAL | 0 refills | Status: AC

## 2020-12-27 NOTE — Telephone Encounter (Signed)
Virtual visit has been scheduled for tomorrow. AM

## 2020-12-28 ENCOUNTER — Telehealth: Payer: BC Managed Care – PPO | Admitting: Sports Medicine

## 2020-12-28 DIAGNOSIS — U071 COVID-19: Secondary | ICD-10-CM

## 2020-12-28 NOTE — Assessment & Plan Note (Signed)
This is a pleasant 55 year old male, vaccinated and boosted for COVID-19, he messaged me approximately 4 days ago with a positive test, his symptoms are predominantly fatigue and myalgias. We started Paxlovid as soon as he messaged me. He took a couple of days, felt some body aches and a bad taste in his mouth and stopped the Paxlovid. In retrospect the muscle aches were probably just a byproduct of the primary disease. Today he is starting to feel a lot better with the exception of severe fatigue, I think we should keep him out of work for at least another couple of weeks, I will write a letter. Return to see me as needed.

## 2020-12-28 NOTE — Progress Notes (Signed)
   Virtual Visit via WebEx/MyChart   I connected with  Benjamin Leonard  on 12/28/20 via WebEx/MyChart/Doximity Video and verified that I am speaking with the correct person using two identifiers.   I discussed the limitations, risks, security and privacy concerns of performing an evaluation and management service by WebEx/MyChart/Doximity Video, including the higher likelihood of inaccurate diagnosis and treatment, and the availability of in person appointments.  We also discussed the likely need of an additional face to face encounter for complete and high quality delivery of care.  I also discussed with the patient that there may be a patient responsible charge related to this service. The patient expressed understanding and wishes to proceed.  Provider location is in medical facility. Patient location is at their home, different from provider location. People involved in care of the patient during this telehealth encounter were myself, my nurse/medical assistant, and my front office/scheduling team member.  Review of Systems: No fevers, chills, night sweats, weight loss, chest pain, or shortness of breath.   Objective Findings:    General: Speaking full sentences, no audible heavy breathing.  Sounds alert and appropriately interactive.  Appears well.  Face symmetric.  Extraocular movements intact.  Pupils equal and round.  No nasal flaring or accessory muscle use visualized.  Independent interpretation of tests performed by another provider:   None.  Brief History, Exam, Impression, and Recommendations:    COVID-19 This is a pleasant 55 year old male, vaccinated and boosted for COVID-19, he messaged me approximately 4 days ago with a positive test, his symptoms are predominantly fatigue and myalgias. We started Paxlovid as soon as he messaged me. He took a couple of days, felt some body aches and a bad taste in his mouth and stopped the Paxlovid. In retrospect the muscle aches were  probably just a byproduct of the primary disease. Today he is starting to feel a lot better with the exception of severe fatigue, I think we should keep him out of work for at least another couple of weeks, I will write a letter. Return to see me as needed.   I discussed the above assessment and treatment plan with the patient. The patient was provided an opportunity to ask questions and all were answered. The patient agreed with the plan and demonstrated an understanding of the instructions.   The patient was advised to call back or seek an in-person evaluation if the symptoms worsen or if the condition fails to improve as anticipated.   I provided 15 minutes of face to face and non-face-to-face time during this encounter date, time was needed to gather information, review chart, records, communicate/coordinate with staff remotely, as well as complete documentation.   ___________________________________________ Gwen Her. Dianah Field, M.D., ABFM., CAQSM. Primary Care and [place] Instructor of [place] of [facility] of Medicine

## 2020-12-28 NOTE — Progress Notes (Signed)
Symptoms started last Sunday night: fatigue and body aches. Cough and sore throat came the past few days. Tested Positive on Thursday 5/26 He has questions.

## 2020-12-29 ENCOUNTER — Telehealth: Payer: Self-pay

## 2020-12-29 NOTE — Telephone Encounter (Signed)
Pt stopped by to leave paperwork to be filled out for Short term Disability. Pt would like a call when paperwork is completed. Paperwork was placed in message box- tvt

## 2020-12-30 NOTE — Telephone Encounter (Signed)
Form completed, faxed, and a copy was sent for scanning into patient's chart. Patient aware original is ready for pick up at the front desk.

## 2021-01-02 NOTE — Telephone Encounter (Signed)
I spent 5 total minutes of online digital evaluation and management services. 

## 2021-01-03 ENCOUNTER — Encounter: Payer: Self-pay | Admitting: Sports Medicine

## 2021-01-16 ENCOUNTER — Telehealth: Payer: Self-pay

## 2021-01-16 NOTE — Telephone Encounter (Signed)
Transition Care Management Follow-up Telephone Call Date of discharge and from where: 01/13/2021 from [facility] How have you been since you were released from the hospital? Pt stated that he is feeling a lot better.  Any questions or concerns? No  Items Reviewed: Did the pt receive and understand the discharge instructions provided? Yes  Medications obtained and verified? Yes  Other? No  Any new allergies since your discharge? No  Dietary orders reviewed? No Do you have support at home? Yes   Functional Questionnaire: (I = Independent and D = Dependent) ADLs: I  Bathing/Dressing- I  Meal Prep- I  Eating- I  Maintaining continence- I  Transferring/Ambulation- I  Managing Meds- I   Follow up appointments reviewed:  PCP Hospital f/u appt confirmed? No   Specialist Hospital f/u appt confirmed? No   Are transportation arrangements needed? No  If their condition worsens, is the pt aware to call PCP or go to the Emergency Dept.? Yes Was the patient provided with contact information for the PCP's office or ED? Yes Was to pt encouraged to call back with questions or concerns? Yes

## 2021-02-27 ENCOUNTER — Ambulatory Visit: Payer: BC Managed Care – PPO | Admitting: Sports Medicine

## 2021-02-27 ENCOUNTER — Encounter: Payer: Self-pay | Admitting: Sports Medicine

## 2021-02-27 ENCOUNTER — Other Ambulatory Visit: Payer: Self-pay

## 2021-02-27 ENCOUNTER — Ambulatory Visit (INDEPENDENT_AMBULATORY_CARE_PROVIDER_SITE_OTHER): Payer: BC Managed Care – PPO

## 2021-02-27 DIAGNOSIS — H01009 Unspecified blepharitis unspecified eye, unspecified eyelid: Secondary | ICD-10-CM | POA: Insufficient documentation

## 2021-02-27 DIAGNOSIS — M5030 Other cervical disc degeneration, unspecified cervical region: Secondary | ICD-10-CM

## 2021-02-27 DIAGNOSIS — M1990 Unspecified osteoarthritis, unspecified site: Secondary | ICD-10-CM | POA: Insufficient documentation

## 2021-02-27 MED ORDER — DEXAMETHASONE 4 MG PO TABS: 4.0000 mg | ORAL_TABLET | Freq: Three times a day (TID) | ORAL | 0 refills | Status: DC

## 2021-02-27 NOTE — Progress Notes (Signed)
    Procedures performed today:    None.  Independent interpretation of notes and tests performed by another provider:   None.  Brief History, Exam, Impression, and Recommendations:    Degenerative disc disease, cervical This is a pleasant 55 year old male, he has a known history of cervical DDD, historically we did some trigger point injections back in 2019 that seem to work well. He is now having recurrence of pain, with bilateral periscapular discomfort and radiation down the right arm in a C8 distribution, no progressive weakness or red flag symptoms. He is averse to prednisone so we will do Decadron, get some updated x-rays, home physical therapy for 4 to 6 weeks. He can return to see me afterwards at which point we will consider MRI for epidural planning. He also has appointment coming up with orthopedic surgery tomorrow for second opinion. Out of work until further notice.  Chronic process with exacerbation and pharmacologic intervention   ___________________________________________ Gwen Her. Dianah Field, M.D., ABFM., CAQSM. Primary Care and [place] Instructor of [place] of [facility] of Medicine

## 2021-02-27 NOTE — Assessment & Plan Note (Signed)
This is a pleasant 55 year old male, he has a known history of cervical DDD, historically we did some trigger point injections back in 2019 that seem to work well. He is now having recurrence of pain, with bilateral periscapular discomfort and radiation down the right arm in a C8 distribution, no progressive weakness or red flag symptoms. He is averse to prednisone so we will do Decadron, get some updated x-rays, home physical therapy for 4 to 6 weeks. He can return to see me afterwards at which point we will consider MRI for epidural planning. He also has appointment coming up with orthopedic surgery tomorrow for second opinion. Out of work until further notice.

## 2021-02-28 ENCOUNTER — Telehealth: Payer: Self-pay | Admitting: Sports Medicine

## 2021-02-28 NOTE — Telephone Encounter (Signed)
Faxed to the number on the form and sent for scanning into the patient's chart.

## 2021-02-28 NOTE — Telephone Encounter (Signed)
Patient dropped off a form for PCP to fill out the bottom of, given directly to Dr. Darene Lamer. AM

## 2021-02-28 NOTE — Telephone Encounter (Signed)
Completed and in my box. 

## 2021-03-23 ENCOUNTER — Other Ambulatory Visit: Payer: Self-pay | Admitting: Neurological Surgery

## 2021-03-23 DIAGNOSIS — M5412 Radiculopathy, cervical region: Secondary | ICD-10-CM

## 2021-03-28 ENCOUNTER — Ambulatory Visit
Admission: RE | Admit: 2021-03-28 | Discharge: 2021-03-28 | Disposition: A | Discharge status: Home / Self Care | Payer: BC Managed Care – PPO | Source: Ambulatory Visit | Attending: Neurological Surgery | Admitting: Neurological Surgery

## 2021-03-28 ENCOUNTER — Other Ambulatory Visit: Payer: Self-pay

## 2021-03-28 DIAGNOSIS — M5412 Radiculopathy, cervical region: Secondary | ICD-10-CM

## 2021-03-28 MED ORDER — IOPAMIDOL (ISOVUE-M 300) INJECTION 61%
1.0000 mL | Freq: Once | INTRAMUSCULAR | Status: AC
  Administered 2021-03-28: 1 mL via EPIDURAL

## 2021-03-28 MED ORDER — TRIAMCINOLONE ACETONIDE 40 MG/ML IJ SUSP (RADIOLOGY)
60.0000 mg | Freq: Once | INTRAMUSCULAR | Status: AC
  Administered 2021-03-28: 60 mg via EPIDURAL

## 2021-03-28 NOTE — Discharge Instructions (Signed)

## 2021-04-06 DIAGNOSIS — M5412 Radiculopathy, cervical region: Secondary | ICD-10-CM | POA: Insufficient documentation

## 2021-04-10 ENCOUNTER — Ambulatory Visit (INDEPENDENT_AMBULATORY_CARE_PROVIDER_SITE_OTHER): Payer: BC Managed Care – PPO | Admitting: Sports Medicine

## 2021-04-10 DIAGNOSIS — M5030 Other cervical disc degeneration, unspecified cervical region: Secondary | ICD-10-CM

## 2021-04-10 NOTE — Progress Notes (Signed)
    Procedures performed today:    None.  Independent interpretation of notes and tests performed by another provider:   None.  Brief History, Exam, Impression, and Recommendations:    Degenerative disc disease, cervical [place] returns, he is a pleasant 55 year old male, he has had a frustrating experience with his neck and right C8 radiculitis, he has gone through conservative treatment, unfortunately has had persistent discomfort, he has seemed to neurosurgeons, both recommending different procedures, he has not been excited about medications, therapy, or injections, I explained him the importance of trying conservative treatment first, he has at this point had medication, therapy, chiropractic manipulations, and epidural injection none of which were helpful so he is now a candidate for an operation, he does have it scheduled, return to see me as needed, I did wish him luck.  I spent 30 minutes of total time managing this patient today, this includes chart review, face to face, and non-face to face time.  Specifically we discussed his experience with his disc disease and seeing various doctors.   ___________________________________________ Gwen Her. Dianah Field, M.D., ABFM., CAQSM. Primary Care and [place] Instructor of [place] of [facility] of Medicine

## 2021-04-10 NOTE — Assessment & Plan Note (Signed)
Maden returns, he is a pleasant 55 year old male, he has had a frustrating experience with his neck and right C8 radiculitis, he has gone through conservative treatment, unfortunately has had persistent discomfort, he has seemed to neurosurgeons, both recommending different procedures, he has not been excited about medications, therapy, or injections, I explained him the importance of trying conservative treatment first, he has at this point had medication, therapy, chiropractic manipulations, and epidural injection none of which were helpful so he is now a candidate for an operation, he does have it scheduled, return to see me as needed, I did wish him luck.

## 2021-04-28 ENCOUNTER — Encounter (INDEPENDENT_AMBULATORY_CARE_PROVIDER_SITE_OTHER): Payer: BC Managed Care – PPO

## 2021-04-28 DIAGNOSIS — M5030 Other cervical disc degeneration, unspecified cervical region: Secondary | ICD-10-CM | POA: Diagnosis not present

## 2021-04-28 NOTE — Telephone Encounter (Signed)
I spent 5 total minutes of online digital evaluation and management services. 

## 2021-05-03 ENCOUNTER — Ambulatory Visit (INDEPENDENT_AMBULATORY_CARE_PROVIDER_SITE_OTHER): Payer: BC Managed Care – PPO | Admitting: Rehabilitative and Restorative Service Providers"

## 2021-05-03 ENCOUNTER — Encounter: Payer: Self-pay | Admitting: Rehabilitative and Restorative Service Providers"

## 2021-05-03 ENCOUNTER — Other Ambulatory Visit: Payer: Self-pay

## 2021-05-03 DIAGNOSIS — R293 Abnormal posture: Secondary | ICD-10-CM

## 2021-05-03 DIAGNOSIS — M539 Dorsopathy, unspecified: Secondary | ICD-10-CM

## 2021-05-03 DIAGNOSIS — R29898 Other symptoms and signs involving the musculoskeletal system: Secondary | ICD-10-CM

## 2021-05-03 NOTE — Therapy (Signed)
[place], [place], [address] Phone: [phone]   Fax:  [phone]  Physical Therapy Evaluation  Patient Details  Name: Benjamin Leonard MRN: [id number] Date of Birth: 09-28-1965 Referring Provider (PT): Dr Cresenciano Lick   Encounter Date: 05/03/2021   PT End of Session - 05/03/21 0721     Visit Number 1    Number of Visits 12    Date for PT Re-Evaluation 06/14/21    PT Start Time 0719    PT Stop Time 0808    PT Time Calculation (min) 49 min    Activity Tolerance Patient tolerated treatment well             Past Medical History:  Diagnosis Date   Borderline hypertension    Hearing loss    Right ear w/ hearing aid (new one in 2008)   OAB (overactive bladder)    or BPH?   Ulcerative colitis     Past Surgical History:  Procedure Laterality Date   BICEPS TENDON REPAIR Right 2016   RHINOPLASTY  1997   right ear surgery for scar tissue removal  1996    There were no vitals filed for this visit.    Subjective Assessment - 05/03/21 0726     Subjective Patient reports neck pain and spasms since 7/22. X-rays show degenerative changes. He was treated with medication with no significant change. He received injection which helped with symptoms. He was scheduled for surgery but has improved to the point that he wants to wait on surgery and try PT.    Pertinent History Lumbar surgery;    Patient Stated Goals avoid surgery; strengthen neck    Currently in Pain? No/denies    Pain Score 0-No pain    Pain Location Neck    Pain Orientation Right    Pain Descriptors / Indicators Spasm    Pain Type Acute pain    Pain Radiating Towards numbness into the Rt UE    Pain Onset More than a month ago    Pain Frequency Intermittent                [facility] PT Assessment - 05/03/21 0001       Assessment   Medical Diagnosis Cervical dysfunction    Referring Provider (PT) Dr Cresenciano Lick    Onset  Date/Surgical Date 01/27/21    Hand Dominance Right    Next MD Visit to schedule    Prior Therapy here for ankle; knee; shoulder      Precautions   Precautions None      Restrictions   Weight Bearing Restrictions No      Balance Screen   Has the patient fallen in the past 6 months No    Has the patient had a decrease in activity level because of a fear of falling?  No    Is the patient reluctant to leave their home because of a fear of falling?  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    Leisure working out 4-5 times/wk; lifting [employer] - free wts and machines; reading; yard work      Observation/Other Assessments   Focus on Therapeutic  Outcomes (FOTO)  55      Sensation   Additional Comments numbness and tingling in Rt UE resolved in the past 2 weeks post ESI      Posture/Postural Control   Posture Comments head forward; shoudlers rounded and elevated; head of the humerus anterior in orientation; scapulae abducted and rotated along the thoracic wall      AROM   Cervical Flexion 48 tight    Cervical Extension 55    Cervical - Right Side Bend 36 tight Lt    Cervical - Left Side Bend 28 tight   Rt    Cervical - Right Rotation 58    Cervical - Left Rotation 63      Strength   Overall Strength Comments WNL's bilat UE's      Palpation   Spinal mobility hypomobile upper thoracic and cervical spine with PA mobs    Palpation comment muscular tightness Rt > Lt pecs; upper trap; leveator; ant/lat/post cervical musculature      Special Tests   Other special tests (+) nural tension test bilat                        Objective measurements completed on examination: See above findings.        [facility] Adult PT Treatment/Exercise - 05/03/21 0001       Self-Care   Self-Care Posture    Posture working on posture and alignment with posterior shoulder girdle engaged      Shoulder Exercises: Standing   Other Standing Exercises axial extension 10 sec x 5; scap squeeze 10 sec x 5; L's x 10; W's x 10 with noodle along spine      Shoulder Exercises: Stretch   Other Shoulder Stretches doorway stretch 3 positions 30 sec hold x 2 reps                     PT Education - 05/03/21 0759     Education Details HEP; posture; office ergonomics; DN; POC    Person(s) Educated Patient    Methods Explanation;Demonstration;Tactile cues;Verbal cues;Handout    Comprehension Verbalized understanding;Returned demonstration;Verbal cues required;Tactile cues required                 PT Long Term Goals - 05/03/21 0814       PT LONG TERM GOAL #1   Title Improve posture and alignment with patient to demonstrate improve upright posture with posterior shoulder girdle engaged    Time 6    Period Weeks    Status New    Target Date 06/14/21      PT LONG TERM GOAL #2   Title Full painfree cervical ROM/mobility    Baseline -    Time 6    Period Weeks    Status New    Target Date 06/14/21      PT LONG TERM GOAL #3   Title Patient demonstrates and/or verbalizes independence with exercise routine and ergonomic modifications    Baseline -    Time 6    Period Weeks    Status New    Target Date 06/14/21      PT LONG TERM GOAL #4   Title Independen in HEP including gym program    Time 6    Period Weeks    Status New    Target Date 06/14/21      PT LONG TERM GOAL #5   Title Improve functional limitation score to 69  Baseline -    Time 6    Period Weeks    Status New    Target Date 06/14/21                    Plan - 05/03/21 0810     Clinical Impression Statement Patient presents with ~ 3 month history of cervical dysfunction including Rt UE  radiculopathy. The Rt UE symptoms have resolved but patient continues to have cervical tightness and discomfort. He has poor cervical and thoracic posture and alignment; limited cervical ROM/mobility; muscular tightness through Rt > Lt cervical and shoulder girdle musculature. Patient will benefit from PT to address problems identified.    Stability/Clinical Decision Making Stable/Uncomplicated    Clinical Decision Making Low    Rehab Potential Good    PT Frequency 2x / week    PT Duration 6 weeks    PT Treatment/Interventions ADLs/Self Care Home Management;Aquatic Therapy;Cryotherapy;Electrical Stimulation;Iontophoresis 4mg /ml Dexamethasone;Moist Heat;Ultrasound;Therapeutic activities;Therapeutic exercise;Neuromuscular re-education;Patient/family education;Manual techniques;Passive range of motion;Dry needling;Taping    PT Next Visit Plan review HEP; progress with stretching; neural mobilization; posterior shoudler girdle strengthening; DN v manual work Rt cervical and upper trap musculature;modalities as indicated    PT Home Exercise Plan [email or web address]    Consulted and Agree with Plan of Care Patient             Patient will benefit from skilled therapeutic intervention in order to improve the following deficits and impairments:  Decreased range of motion, Increased muscle spasms, Pain, Decreased activity tolerance, Impaired flexibility, Improper body mechanics, Decreased mobility, Postural dysfunction  Visit Diagnosis: Cervical dysfunction  Abnormal posture  Other symptoms and signs involving the musculoskeletal system     Problem List Patient Active Problem List   Diagnosis Date Noted   COVID-19 12/23/2020   Left knee injury 12/09/2020   Swelling of left wrist 08/13/2019   Dry eye syndrome 08/13/2019   Bilateral hand swelling 02/23/2019   Primary osteoarthritis of both hands 02/09/2019   Allergic conjunctivitis 02/09/2019   Right patellofemoral syndrome 04/12/2016    Hyperlipidemia 01/10/2016   Degenerative disc disease, cervical 01/10/2016   Anxiety and depression 02/15/2015   Left Achilles tendinitis 01/18/2015   Lumbar spinal stenosis 09/30/2014   Hiatal hernia 09/03/2014   Hemangioma of liver 08/06/2014   Exertional chest pain 06/22/2014   Urinary obstruction 12/08/2013   Right distal Biceps tendon tear 12/04/2012   Annual physical exam 03/08/2012   Fatigue 10/19/2010   Perennial allergic rhinitis 10/19/2010   COLITIS, ULCERATIVE NOS 05/27/2007    Alicja Everitt Nilda Simmer, PT, MPH  05/03/2021, [place], [place], [address] Phone: [phone]   Fax:  [phone]  Name: Benjamin Leonard MRN: [id number] Date of Birth: 09/18/1965

## 2021-05-03 NOTE — Patient Instructions (Addendum)
Access Code: [email or web address] URL: [email or web address] Date: 05/03/2021 Prepared by: Gillermo Murdoch  Exercises Seated Cervical Retraction - 3 x daily - 7 x weekly - 10 reps - 1 sets Standing Scapular Retraction - 3 x daily - 7 x weekly - 10 reps - 1 sets - 10 hold Shoulder External Rotation and Scapular Retraction - 3 x daily - 7 x weekly - 10 reps - 1 sets - hold Shoulder External Rotation in 45 Degrees Abduction - 2 x daily - 7 x weekly - 1-2 sets - 10 reps - 3 sec hold Doorway Pec Stretch at 60 Degrees Abduction - 3 x daily - 7 x weekly - 3 reps - 1 sets Doorway Pec Stretch at 90 Degrees Abduction - 3 x daily - 7 x weekly - 3 reps - 1 sets - 30 seconds hold Doorway Pec Stretch at 120 Degrees Abduction - 3 x daily - 7 x weekly - 3 reps - 1 sets - 30 second hold hold Supine Chest Stretch on Foam Roll - 2 x daily - 7 x weekly - 1 sets - 1 reps - 2-5 min sec hold  Patient Education Agricultural consultant Posture Trigger Point Dry Needling

## 2021-05-05 ENCOUNTER — Encounter: Payer: Self-pay | Admitting: Rehabilitative and Restorative Service Providers"

## 2021-05-05 ENCOUNTER — Other Ambulatory Visit: Payer: Self-pay

## 2021-05-05 ENCOUNTER — Ambulatory Visit (INDEPENDENT_AMBULATORY_CARE_PROVIDER_SITE_OTHER): Payer: BC Managed Care – PPO | Admitting: Rehabilitative and Restorative Service Providers"

## 2021-05-05 DIAGNOSIS — M539 Dorsopathy, unspecified: Secondary | ICD-10-CM | POA: Diagnosis not present

## 2021-05-05 DIAGNOSIS — R29898 Other symptoms and signs involving the musculoskeletal system: Secondary | ICD-10-CM | POA: Diagnosis not present

## 2021-05-05 DIAGNOSIS — R293 Abnormal posture: Secondary | ICD-10-CM | POA: Diagnosis not present

## 2021-05-05 NOTE — Therapy (Signed)
[place], [place], [address] Phone: [phone]   Fax:  [phone]  Physical Therapy Treatment  Patient Details  Name: Benjamin Leonard MRN: [id number] Date of Birth: 09-09-1965 Referring Provider (PT): Dr Cresenciano Lick   Encounter Date: 05/05/2021   PT End of Session - 05/05/21 1059     Visit Number 2    Number of Visits 12    Date for PT Re-Evaluation 06/14/21    PT Start Time 1058    PT Stop Time 1147    PT Time Calculation (min) 49 min    Activity Tolerance Patient tolerated treatment well             Past Medical History:  Diagnosis Date   Borderline hypertension    Hearing loss    Right ear w/ hearing aid (new one in 2008)   OAB (overactive bladder)    or BPH?   Ulcerative colitis     Past Surgical History:  Procedure Laterality Date   BICEPS TENDON REPAIR Right 2016   RHINOPLASTY  1997   right ear surgery for scar tissue removal  1996    There were no vitals filed for this visit.   Subjective Assessment - 05/05/21 1100     Subjective Patient reports that his HEP is going well. He added some lower trap exercises in the gym lying on his stomach and those feel good.    Currently in Pain? No/denies    Pain Score 0-No pain                               [facility] Adult PT Treatment/Exercise - 05/05/21 0001       Self-Care   Self-Care Posture    Posture working on posture and alignment with posterior shoulder girdle engaged      Shoulder Exercises: Prone   Other Prone Exercises prone series arms at side, W, T, goal post, Y, superman 5 sec hold x 2-3 reps each position      Shoulder Exercises: Standing   Other Standing Exercises axial extension 10 sec x 5; scap squeeze 10 sec x 5; L's x 10; W's x 10 with noodle along spine      Shoulder Exercises: ROM/Strengthening   "W" Arms 10 reps x 3-5 sec hold red TB    Other ROM/Strengthening Exercises facilate  serratus with red TB pushing UE's up wall x 10 reps      Shoulder Exercises: Stretch   Other Shoulder Stretches doorway stretch 3 positions 30 sec hold x 2 reps    Other Shoulder Stretches added shoulder flexion stepping under doorway 30 sec x 2      Moist Heat Therapy   Number Minutes Moist Heat 10 Minutes    Moist Heat Location Cervical;Shoulder      Manual Therapy   Manual therapy comments skilled palpation to assessresponse to Dn and manual work    Soft tissue mobilization deep tissue work through the Rt upper trap and cervical musculature    Myofascial Release Rt upper quarter              Trigger Point Dry Needling - 05/05/21 0001     Consent Given? Yes    Education Handout Provided Yes    Muscles Treated Head and Neck Suboccipitals;Levator scapulae;Scalenes;Cervical multifidi    Other Dry Needling Rt    Upper Trapezius Response Palpable increased muscle length;Twitch reponse  elicited    Cervical multifidi Response Palpable increased muscle length;Twitch reponse elicited                   PT Education - 05/05/21 1141     Education Details HEP DN    Person(s) Educated Patient    Methods Explanation;Demonstration;Tactile cues;Verbal cues;Handout    Comprehension Verbalized understanding;Returned demonstration;Verbal cues required;Tactile cues required                 PT Long Term Goals - 05/03/21 0814       PT LONG TERM GOAL #1   Title Improve posture and alignment with patient to demonstrate improve upright posture with posterior shoulder girdle engaged    Time 6    Period Weeks    Status New    Target Date 06/14/21      PT LONG TERM GOAL #2   Title Full painfree cervical ROM/mobility    Baseline -    Time 6    Period Weeks    Status New    Target Date 06/14/21      PT LONG TERM GOAL #3   Title Patient demonstrates and/or verbalizes independence with exercise routine and ergonomic modifications    Baseline -    Time 6    Period Weeks     Status New    Target Date 06/14/21      PT LONG TERM GOAL #4   Title Independen in HEP including gym program    Time 6    Period Weeks    Status New    Target Date 06/14/21      PT LONG TERM GOAL #5   Title Improve functional limitation score to 69    Baseline -    Time 6    Period Weeks    Status New    Target Date 06/14/21                   Plan - 05/05/21 1114     Clinical Impression Statement Good return demo of HEP. Added posterior shoulder girdle strengthening exercises in standing with TB and in prone. Trial of DN to Rt cervical and upper trap musculature tolerated well.    Rehab Potential Good    PT Frequency 2x / week    PT Duration 6 weeks    PT Treatment/Interventions ADLs/Self Care Home Management;Aquatic Therapy;Cryotherapy;Electrical Stimulation;Iontophoresis 4mg /ml Dexamethasone;Moist Heat;Ultrasound;Therapeutic activities;Therapeutic exercise;Neuromuscular re-education;Patient/family education;Manual techniques;Passive range of motion;Dry needling;Taping    PT Next Visit Plan review HEP; progress with stretching; neural mobilization; posterior shoudler girdle strengthening; DN v manual work Rt cervical and upper trap musculature;modalities as indicated    PT Home Exercise Plan [email or web address]    Consulted and Agree with Plan of Care Patient             Patient will benefit from skilled therapeutic intervention in order to improve the following deficits and impairments:     Visit Diagnosis: Cervical dysfunction  Abnormal posture  Other symptoms and signs involving the musculoskeletal system     Problem List Patient Active Problem List   Diagnosis Date Noted   COVID-19 12/23/2020   Left knee injury 12/09/2020   Swelling of left wrist 08/13/2019   Dry eye syndrome 08/13/2019   Bilateral hand swelling 02/23/2019   Primary osteoarthritis of both hands 02/09/2019   Allergic conjunctivitis 02/09/2019   Right patellofemoral syndrome 04/12/2016    Hyperlipidemia 01/10/2016   Degenerative disc disease, cervical 01/10/2016   Anxiety and  depression 02/15/2015   Left Achilles tendinitis 01/18/2015   Lumbar spinal stenosis 09/30/2014   Hiatal hernia 09/03/2014   Hemangioma of liver 08/06/2014   Exertional chest pain 06/22/2014   Urinary obstruction 12/08/2013   Right distal Biceps tendon tear 12/04/2012   Annual physical exam 03/08/2012   Fatigue 10/19/2010   Perennial allergic rhinitis 10/19/2010   COLITIS, ULCERATIVE NOS 05/27/2007    Armanii Pressnell Nilda Simmer, PT, MPH  05/05/2021, 11:43 AM  [facility] [place], [place], [address] Phone: [phone]   Fax:  [phone]  Name: Benjamin Leonard MRN: [id number] Date of Birth: 10-09-1965

## 2021-05-05 NOTE — Patient Instructions (Signed)
Trigger Point Dry Needling  What is Trigger Point Dry Needling (DN)? DN is a physical therapy technique used to treat muscle pain and dysfunction. Specifically, DN helps deactivate muscle trigger points (muscle knots).  A thin filiform needle is used to penetrate the skin and stimulate the underlying trigger point. The goal is for a local twitch response (LTR) to occur and for the trigger point to relax. No medication of any kind is injected during the procedure.   What Does Trigger Point Dry Needling Feel Like?  The procedure feels different for each individual patient. Some patients report that they do not actually feel the needle enter the skin and overall the process is not painful. Very mild bleeding may occur. However, many patients feel a deep cramping in the muscle in which the needle was inserted. This is the local twitch response.   How Will I feel after the treatment? Soreness is normal, and the onset of soreness may not occur for a few hours. Typically this soreness does not last longer than two days.  Bruising is uncommon, however; ice can be used to decrease any possible bruising.  In rare cases feeling tired or nauseous after the treatment is normal. In addition, your symptoms may get worse before they get better, this period will typically not last longer than 24 hours.   What Can I do After My Treatment? Increase your hydration by drinking more water for the next 24 hours. You may place ice or heat on the areas treated that have become sore, however, do not use heat on inflamed or bruised areas. Heat often brings more relief post needling. You can continue your regular activities, but vigorous activity is not recommended initially after the treatment for 24 hours. DN is best combined with other physical therapy such as strengthening, stretching, and other therapies.  Access Code: [email or web address] URL: [email or web address] Date: 05/05/2021 Prepared by: Gillermo Murdoch  Exercises Seated Cervical Retraction - 3 x daily - 7 x weekly - 10 reps - 1 sets Standing Scapular Retraction - 3 x daily - 7 x weekly - 10 reps - 1 sets - 10 hold Shoulder External Rotation and Scapular Retraction - 3 x daily - 7 x weekly - 10 reps - 1 sets - hold Shoulder External Rotation in 45 Degrees Abduction - 2 x daily - 7 x weekly - 1-2 sets - 10 reps - 3 sec hold Doorway Pec Stretch at 60 Degrees Abduction - 3 x daily - 7 x weekly - 3 reps - 1 sets Doorway Pec Stretch at 90 Degrees Abduction - 3 x daily - 7 x weekly - 3 reps - 1 sets - 30 seconds hold Doorway Pec Stretch at 120 Degrees Abduction - 3 x daily - 7 x weekly - 3 reps - 1 sets - 30 second hold hold Supine Chest Stretch on Foam Roll - 2 x daily - 7 x weekly - 1 sets - 1 reps - 2-5 min sec hold Seated Shoulder W External Rotation on Swiss Ball - 1 x daily - 7 x weekly - 1 sets - 10 reps - 3 sec hold Shoulder Flexion Serratus Activation with Resistance - 1 x daily - 7 x weekly - 1 sets - 10 reps - 3-5 sec hold Prone Scapular Retraction - 2 x daily - 7 x weekly - 1 sets - 5-10 reps - 3-5 sec hold Prone Scapular Retraction Arms at Side - 2 x daily - 7 x weekly - 1 sets - 5-10  reps - 3-5 sec hold Prone Scapular Retraction Y - 2 x daily - 7 x weekly - 1 sets - 3-5 reps - 5-10 sec hold Prone W Scapular Retraction - 2 x daily - 7 x weekly - 1 sets - 5-10 reps - 3-5 sec hold Prone Scapular Retraction in Abduction - 2 x daily - 7 x weekly - 1 sets - 5-10 reps - 3-5 sec hold Prone Scapular Retraction in Flexion - 2 x daily - 7 x weekly - 1 sets - 5-10 reps - 3-5 sec hold

## 2021-05-09 ENCOUNTER — Other Ambulatory Visit: Payer: Self-pay

## 2021-05-09 ENCOUNTER — Ambulatory Visit (INDEPENDENT_AMBULATORY_CARE_PROVIDER_SITE_OTHER): Payer: BC Managed Care – PPO | Admitting: Rehabilitative and Restorative Service Providers"

## 2021-05-09 ENCOUNTER — Encounter: Payer: Self-pay | Admitting: Rehabilitative and Restorative Service Providers"

## 2021-05-09 DIAGNOSIS — R29898 Other symptoms and signs involving the musculoskeletal system: Secondary | ICD-10-CM

## 2021-05-09 DIAGNOSIS — R293 Abnormal posture: Secondary | ICD-10-CM

## 2021-05-09 DIAGNOSIS — M539 Dorsopathy, unspecified: Secondary | ICD-10-CM | POA: Diagnosis not present

## 2021-05-09 NOTE — Therapy (Addendum)
[place], [place], [address] Phone: [phone]   Fax:  [phone]  Physical Therapy Treatment and Discharge Summary   PHYSICAL THERAPY DISCHARGE SUMMARY  Visits from Start of Care: 3  Current functional level related to goals / functional outcomes: See progress note for discharge status   Remaining deficits: Unknown   Education / Equipment: HEP    Patient agrees to discharge. Patient goals were partially met. Patient is being discharged due to not returning since the last visit.  Benjamin Leonard P. Helene Kelp PT, MPH 06/09/21 11:55 AM  Patient Details  Name: Benjamin Leonard MRN: [id number] Date of Birth: June 06, 1966 Referring Provider (PT): Dr Cresenciano Lick   Encounter Date: 05/09/2021   PT End of Session - 05/09/21 0805     Visit Number 3    Number of Visits 12    Date for PT Re-Evaluation 06/14/21    PT Start Time 0804    PT Stop Time 0832   moist heat x 5 min   PT Time Calculation (min) 28 min    Activity Tolerance Patient tolerated treatment well             Past Medical History:  Diagnosis Date   Borderline hypertension    Hearing loss    Right ear w/ hearing aid (new one in 2008)   OAB (overactive bladder)    or BPH?   Ulcerative colitis     Past Surgical History:  Procedure Laterality Date   BICEPS TENDON REPAIR Right 2016   RHINOPLASTY  1997   right ear surgery for scar tissue removal  1996    There were no vitals filed for this visit.   Subjective Assessment - 05/09/21 0820     Subjective Doing well. Patient has been working on his exercises at home and in the gym. No soreness from the DN last appointment.    Currently in Pain? No/denies    Pain Score 0-No pain                [facility] PT Assessment - 05/09/21 0001       Assessment   Medical Diagnosis Cervical dysfunction    Referring Provider (PT) Dr Cresenciano Lick    Onset Date/Surgical Date 01/27/21    Hand  Dominance Right    Next MD Visit to schedule    Prior Therapy here for ankle; knee; shoulder      Palpation   Palpation comment muscular tightness Rt > Lt pecs; upper trap; leveator; ant/lat/post cervical musculature                           [facility] Adult PT Treatment/Exercise - 05/09/21 0001       Moist Heat Therapy   Number Minutes Moist Heat 5 Minutes    Moist Heat Location Cervical;Shoulder      Manual Therapy   Manual therapy comments skilled palpation to assessresponse to Dn and manual work    Soft tissue mobilization deep tissue work through the Rt upper trap and cervical musculature    Myofascial Release Rt upper quarter              Trigger Point Dry Needling - 05/09/21 0001     Consent Given? Yes    Education Handout Provided Previously provided    Electrical Stimulation Performed with Dry Needling Yes    Other Dry Needling Rt    Upper Trapezius Response Palpable increased muscle  length;Twitch reponse elicited    Cervical multifidi Response Palpable increased muscle length;Twitch reponse elicited                        PT Long Term Goals - 05/03/21 0814       PT LONG TERM GOAL #1   Title Improve posture and alignment with patient to demonstrate improve upright posture with posterior shoulder girdle engaged    Time 6    Period Weeks    Status New    Target Date 06/14/21      PT LONG TERM GOAL #2   Title Full painfree cervical ROM/mobility    Baseline -    Time 6    Period Weeks    Status New    Target Date 06/14/21      PT LONG TERM GOAL #3   Title Patient demonstrates and/or verbalizes independence with exercise routine and ergonomic modifications    Baseline -    Time 6    Period Weeks    Status New    Target Date 06/14/21      PT LONG TERM GOAL #4   Title Independen in HEP including gym program    Time 6    Period Weeks    Status New    Target Date 06/14/21      PT LONG TERM GOAL #5   Title Improve  functional limitation score to 69    Baseline -    Time 6    Period Weeks    Status New    Target Date 06/14/21                   Plan - 05/09/21 0827     Clinical Impression Statement Good response to DN at lastvisit. Working on [employer]. Note decreased palpable tightness in the Rt upper quarter. Note decreased tightness following DN and manul work.    Rehab Potential Good    PT Frequency 2x / week    PT Duration 6 weeks    PT Treatment/Interventions ADLs/Self Care Home Management;Aquatic Therapy;Cryotherapy;Electrical Stimulation;Iontophoresis 77m/ml Dexamethasone;Moist Heat;Ultrasound;Therapeutic activities;Therapeutic exercise;Neuromuscular re-education;Patient/family education;Manual techniques;Passive range of motion;Dry needling;Taping    PT Next Visit Plan review HEP; progress with stretching; neural mobilization; posterior shoulder girdle strengthening; DN v manual work Rt cervical and upper trap musculature;modalities as indicated    PT Home Exercise Plan [email or web address]    Consulted and Agree with Plan of Care Patient             Patient will benefit from skilled therapeutic intervention in order to improve the following deficits and impairments:     Visit Diagnosis: Cervical dysfunction  Abnormal posture  Other symptoms and signs involving the musculoskeletal system     Problem List Patient Active Problem List   Diagnosis Date Noted   COVID-19 12/23/2020   Left knee injury 12/09/2020   Swelling of left wrist 08/13/2019   Dry eye syndrome 08/13/2019   Bilateral hand swelling 02/23/2019   Primary osteoarthritis of both hands 02/09/2019   Allergic conjunctivitis 02/09/2019   Right patellofemoral syndrome 04/12/2016   Hyperlipidemia 01/10/2016   Degenerative disc disease, cervical 01/10/2016   Anxiety and depression 02/15/2015   Left Achilles tendinitis 01/18/2015   Lumbar spinal stenosis 09/30/2014   Hiatal hernia 09/03/2014   Hemangioma of liver  08/06/2014   Exertional chest pain 06/22/2014   Urinary obstruction 12/08/2013   Right distal Biceps tendon tear 12/04/2012   Annual physical exam 03/08/2012  Fatigue 10/19/2010   Perennial allergic rhinitis 10/19/2010   COLITIS, ULCERATIVE NOS 05/27/2007    Zamari Bonsall Nilda Simmer, PT, MPH  05/09/2021, 8:35 AM  [facility] [place], [place], [address] Phone: [phone]   Fax:  [phone]  Name: Benjamin Leonard MRN: [id number] Date of Birth: 1965-08-04

## 2021-05-11 ENCOUNTER — Encounter: Payer: BC Managed Care – PPO | Admitting: Rehabilitative and Restorative Service Providers"

## 2021-05-15 ENCOUNTER — Encounter: Payer: BC Managed Care – PPO | Admitting: Rehabilitative and Restorative Service Providers"

## 2021-05-18 ENCOUNTER — Encounter: Payer: BC Managed Care – PPO | Admitting: Rehabilitative and Restorative Service Providers"

## 2021-07-07 ENCOUNTER — Other Ambulatory Visit: Payer: Self-pay

## 2021-07-07 ENCOUNTER — Ambulatory Visit (INDEPENDENT_AMBULATORY_CARE_PROVIDER_SITE_OTHER): Payer: BC Managed Care – PPO | Admitting: Sports Medicine

## 2021-07-07 DIAGNOSIS — Z23 Encounter for immunization: Secondary | ICD-10-CM | POA: Diagnosis not present

## 2021-08-11 ENCOUNTER — Ambulatory Visit (INDEPENDENT_AMBULATORY_CARE_PROVIDER_SITE_OTHER): Payer: BC Managed Care – PPO

## 2021-08-11 ENCOUNTER — Ambulatory Visit: Payer: BC Managed Care – PPO | Admitting: Sports Medicine

## 2021-08-11 ENCOUNTER — Other Ambulatory Visit: Payer: Self-pay

## 2021-08-11 DIAGNOSIS — M7541 Impingement syndrome of right shoulder: Secondary | ICD-10-CM | POA: Diagnosis not present

## 2021-08-11 DIAGNOSIS — G8929 Other chronic pain: Secondary | ICD-10-CM

## 2021-08-11 DIAGNOSIS — M25561 Pain in right knee: Secondary | ICD-10-CM

## 2021-08-11 DIAGNOSIS — M25562 Pain in left knee: Secondary | ICD-10-CM

## 2021-08-11 DIAGNOSIS — M75101 Unspecified rotator cuff tear or rupture of right shoulder, not specified as traumatic: Secondary | ICD-10-CM | POA: Insufficient documentation

## 2021-08-11 DIAGNOSIS — M222X2 Patellofemoral disorders, left knee: Secondary | ICD-10-CM

## 2021-08-11 NOTE — Assessment & Plan Note (Signed)
Discomfort posterior and over the deltoid, impingement signs on exam, discussed the anatomy, pathophysiology, adding cuff conditioning, x-rays, return to see me in 6 weeks as needed.

## 2021-08-11 NOTE — Progress Notes (Signed)
° ° °  Procedures performed today:    None.  Independent interpretation of notes and tests performed by another provider:   None.  Brief History, Exam, Impression, and Recommendations:    Impingement syndrome, shoulder, right Discomfort posterior and over the deltoid, impingement signs on exam, discussed the anatomy, pathophysiology, adding cuff conditioning, x-rays, return to see me in 6 weeks as needed.  Patellofemoral syndrome, left Dontavis is also having left knee pain, anterior, worse with squatting, going up and down stairs, on further questioning he has been doing squats into deep flexion, advised against this, we did discuss the anatomy, pathophysiology, getting x-rays, patellofemoral home conditioning. Hip abductor's are good. Return to see me in 6 weeks as needed.    ___________________________________________ Gwen Her. Dianah Field, M.D., ABFM., CAQSM. Primary Care and [place] Instructor of [place] of [facility] of Medicine

## 2021-08-11 NOTE — Assessment & Plan Note (Signed)
Benjamin Leonard is also having left knee pain, anterior, worse with squatting, going up and down stairs, on further questioning he has been doing squats into deep flexion, advised against this, we did discuss the anatomy, pathophysiology, getting x-rays, patellofemoral home conditioning. Hip abductor's are good. Return to see me in 6 weeks as needed.

## 2021-08-17 LAB — HM COLONOSCOPY

## 2021-09-01 ENCOUNTER — Encounter: Payer: Self-pay | Admitting: Sports Medicine

## 2021-09-18 ENCOUNTER — Ambulatory Visit: Payer: BC Managed Care – PPO | Admitting: Sports Medicine

## 2021-09-22 ENCOUNTER — Other Ambulatory Visit: Payer: Self-pay

## 2021-09-22 ENCOUNTER — Ambulatory Visit: Payer: BC Managed Care – PPO | Admitting: Sports Medicine

## 2021-09-22 ENCOUNTER — Ambulatory Visit (INDEPENDENT_AMBULATORY_CARE_PROVIDER_SITE_OTHER): Payer: BC Managed Care – PPO

## 2021-09-22 DIAGNOSIS — M222X2 Patellofemoral disorders, left knee: Secondary | ICD-10-CM

## 2021-09-22 DIAGNOSIS — M7541 Impingement syndrome of right shoulder: Secondary | ICD-10-CM

## 2021-09-22 NOTE — Progress Notes (Signed)
° ° °  Procedures performed today:    Procedure: Real-time Ultrasound Guided injection of the right glenohumeral joint Device: Samsung HS60  Verbal informed consent obtained.  Time-out conducted.  Noted no overlying erythema, induration, or other signs of local infection.  Skin prepped in a sterile fashion.  Local anesthesia: Topical Ethyl chloride.  With sterile technique and under real time ultrasound guidance: Noted normal-appearing joint, 1 cc Kenalog 40, 2 cc lidocaine, 2 cc bupivacaine injected easily Completed without difficulty  Advised to call if fevers/chills, erythema, induration, drainage, or persistent bleeding.  Images permanently stored and available for review in PACS.  Impression: Technically successful ultrasound guided injection.  Independent interpretation of notes and tests performed by another provider:   None.  Brief History, Exam, Impression, and Recommendations:    Impingement syndrome, shoulder, right Benjamin Leonard is a very pleasant 56 year old male, he has chronic right shoulder pain, he does work on a [employer], he has to lift boxes overhead over and over through the day. At the last visit he had mostly posterior impingement type symptoms, as well as other impingement signs. Today he still has some impingement signs but a more strongly positive O'Brien's test concerning for a labral injury. Today we did a glenohumeral joint injection after discussing the pros and cons of doing the injection now as opposed to waiting for arthrogram. If insufficient improvement we will proceed with MR arthrography, he does get [facility] in [place].  I do suspect they will have to do the arthrogram injection. We will do a virtual visit in a month to reevaluate.  Patellofemoral syndrome, left Benjamin Leonard also had anterior knee pain worse with squatting, stairs. On further questioning he had been doing squats into deep flexion, we advised  against this, we added some patellofemoral home conditioning. X-rays showed osteoarthritis. He is doing well today, return as needed.    ___________________________________________ Gwen Her. Dianah Field, M.D., ABFM., CAQSM. Primary Care and [place] Instructor of [place] of [facility] of Medicine

## 2021-09-22 NOTE — Assessment & Plan Note (Signed)
Benjamin Leonard is a very pleasant 56 year old male, he has chronic right shoulder pain, he does work on a [employer], he has to lift boxes overhead over and over through the day. At the last visit he had mostly posterior impingement type symptoms, as well as other impingement signs. Today he still has some impingement signs but a more strongly positive O'Brien's test concerning for a labral injury. Today we did a glenohumeral joint injection after discussing the pros and cons of doing the injection now as opposed to waiting for arthrogram. If insufficient improvement we will proceed with MR arthrography, he does get free [facility]Is at [facility] in [place].  I do suspect they will have to do the arthrogram injection. We will do a virtual visit in a month to reevaluate.

## 2021-09-22 NOTE — Assessment & Plan Note (Signed)
Benjamin Leonard also had anterior knee pain worse with squatting, stairs. On further questioning he had been doing squats into deep flexion, we advised against this, we added some patellofemoral home conditioning. X-rays showed osteoarthritis. He is doing well today, return as needed.

## 2021-10-17 ENCOUNTER — Encounter: Payer: Self-pay | Admitting: Sports Medicine

## 2021-10-17 DIAGNOSIS — M7541 Impingement syndrome of right shoulder: Secondary | ICD-10-CM

## 2021-11-23 ENCOUNTER — Encounter: Payer: Self-pay | Admitting: Sports Medicine

## 2021-11-28 ENCOUNTER — Telehealth: Payer: BC Managed Care – PPO | Admitting: Sports Medicine

## 2021-11-28 DIAGNOSIS — M75111 Incomplete rotator cuff tear or rupture of right shoulder, not specified as traumatic: Secondary | ICD-10-CM

## 2021-11-28 MED ORDER — NITROGLYCERIN 0.2 MG/HR TD PT24: MEDICATED_PATCH | TRANSDERMAL | 11 refills | Status: DC

## 2021-11-28 NOTE — Assessment & Plan Note (Addendum)
Benjamin Leonard returns, he is a very pleasant 56 year old male, chronic shoulder pain, we tried a glenohumeral injection, activity modification, ultimately MRI showed a 50% supraspinatus tear, he also had some signs of labral injury although not really seen on MRI due to lack of intra-articular contrast. ?He is certainly averse to surgery as I would be too, so we will start with topical nitroglycerin, he understands this can take 8 weeks to heal the rotator cuff/reduce his pain. ?He will need to be taken out of work in the meantime. ?Return to see me in a virtual visit to do his disability forms. ?

## 2021-11-28 NOTE — Progress Notes (Addendum)
? ?  Virtual Visit via Telephone ?  ?I connected with  Benjamin Leonard  on 12/04/21 by telephone/telehealth and verified that I am speaking with the correct person using two identifiers. ?  ?I discussed the limitations, risks, security and privacy concerns of performing an evaluation and management service by telephone, including the higher likelihood of inaccurate diagnosis and treatment, and the availability of in person appointments.  We also discussed the likely need of an additional face to face encounter for complete and high quality delivery of care.  I also discussed with the patient that there may be a patient responsible charge related to this service. The patient expressed understanding and wishes to proceed. ? ?Provider location is in medical facility. ?Patient location is at their home, different from provider location. ?People involved in care of the patient during this telehealth encounter were myself, my nurse/medical assistant, and my front office/scheduling team member. ? ?Review of Systems: No fevers, chills, night sweats, weight loss, chest pain, or shortness of breath.  ? ?Objective Findings:   ? ?General: Speaking full sentences, no audible heavy breathing.  Sounds alert and appropriately interactive.   ? ?Independent interpretation of tests performed by another provider:  ? ?None. ? ?Brief History, Exam, Impression, and Recommendations:   ? ?Rotator cuff tear, right ?Benjamin Leonard returns, he is a very pleasant 56 year old male, chronic shoulder pain, we tried a glenohumeral injection, activity modification, ultimately MRI showed a 50% supraspinatus tear, he also had some signs of labral injury although not really seen on MRI due to lack of intra-articular contrast. ?He is certainly averse to surgery as I would be too, so we will start with topical nitroglycerin, he understands this can take 8 weeks to heal the rotator cuff/reduce his pain. ?He will need to be taken out of work in the  meantime. ?Return to see me in a virtual visit to do his disability forms. ? ? ?I discussed the above assessment and treatment plan with the patient. The patient was provided an opportunity to ask questions and all were answered. The patient agreed with the plan and demonstrated an understanding of the instructions. ?  ?The patient was advised to call back or seek an in-person evaluation if the symptoms worsen or if the condition fails to improve as anticipated. ?  ?I provided 30 minutes of verbal and non-verbal time during this encounter date, time was needed to gather information, review chart, records, communicate/coordinate with staff remotely, as well as complete documentation. ? ? ?___________________________________________ ?Gwen Her. Dianah Field, M.D., ABFM., CAQSM. ?Primary Care and Sports Medicine ?[place] ? ?Adjunct Professor of Family Medicine  ?[employer] ?

## 2021-12-04 ENCOUNTER — Ambulatory Visit: Payer: BC Managed Care – PPO | Admitting: Sports Medicine

## 2021-12-04 ENCOUNTER — Ambulatory Visit (INDEPENDENT_AMBULATORY_CARE_PROVIDER_SITE_OTHER): Payer: BC Managed Care – PPO

## 2021-12-04 DIAGNOSIS — M75111 Incomplete rotator cuff tear or rupture of right shoulder, not specified as traumatic: Secondary | ICD-10-CM | POA: Diagnosis not present

## 2021-12-04 NOTE — Assessment & Plan Note (Signed)
Benjamin Leonard returns, he is a very pleasant 56 year old male, he has chronic shoulder pain, he had a glenohumeral injection without sufficient relief, MRI showed 50% supraspinatus tearing, also some signs of a labral injury. ?Due to more impingement signs today and desire to avoid the operating room we did a subacromial injection, I also filled out some disability paperwork. ?Continue with topical nitroglycerin. ?The disability paperwork has him out until the end of June. ?If insufficient improvement after his subacromial injection today he will need arthroscopic intervention. ?

## 2021-12-04 NOTE — Progress Notes (Signed)
? ? ?  Procedures performed today:   ? ?Procedure: Real-time Ultrasound Guided injection of the right subacromial bursa ?Device: Samsung HS60  ?Verbal informed consent obtained.  ?Time-out conducted.  ?Noted no overlying erythema, induration, or other signs of local infection.  ?Skin prepped in a sterile fashion.  ?Local anesthesia: Topical Ethyl chloride.  ?With sterile technique and under real time ultrasound guidance: Noted cuff tearing, 1 cc Kenalog 40, 2 cc lidocaine, 2 cc bupivacaine injected easily ?Completed without difficulty  ?Advised to call if fevers/chills, erythema, induration, drainage, or persistent bleeding.  ?Images permanently stored and available for review in PACS.  ?Impression: Technically successful ultrasound guided injection. ? ?Independent interpretation of notes and tests performed by another provider:  ? ?None. ? ?Brief History, Exam, Impression, and Recommendations:   ? ?Rotator cuff tear, right ?Benjamin Leonard, he is a very pleasant 56 year old male, he has chronic shoulder pain, he had a glenohumeral injection without sufficient relief, MRI showed 50% supraspinatus tearing, also some signs of a labral injury. ?Due to more impingement signs today and desire to avoid the operating room we did a subacromial injection, I also filled out some disability paperwork. ?Continue with topical nitroglycerin. ?The disability paperwork has him out until the end of June. ?If insufficient improvement after his subacromial injection today he will need arthroscopic intervention. ? ? ? ?___________________________________________ ?Gwen Her. Dianah Field, M.D., ABFM., CAQSM. ?Primary Care and Sports Medicine ?[place] ? ?Adjunct Instructor of Family Medicine  ?[employer] ?

## 2022-01-15 ENCOUNTER — Telehealth (INDEPENDENT_AMBULATORY_CARE_PROVIDER_SITE_OTHER): Payer: BC Managed Care – PPO | Admitting: Sports Medicine

## 2022-01-15 DIAGNOSIS — M75111 Incomplete rotator cuff tear or rupture of right shoulder, not specified as traumatic: Secondary | ICD-10-CM

## 2022-01-15 NOTE — Progress Notes (Signed)
Not as good as I'd like.

## 2022-01-15 NOTE — Progress Notes (Signed)
   Virtual Visit via Telephone   I connected with  Benjamin Leonard  on 01/15/22 by telephone/telehealth and verified that I am speaking with the correct person using two identifiers.   I discussed the limitations, risks, security and privacy concerns of performing an evaluation and management service by telephone, including the higher likelihood of inaccurate diagnosis and treatment, and the availability of in person appointments.  We also discussed the likely need of an additional face to face encounter for complete and high quality delivery of care.  I also discussed with the patient that there may be a patient responsible charge related to this service. The patient expressed understanding and wishes to proceed.  Provider location is in medical facility. Patient location is at their home, different from provider location. People involved in care of the patient during this telehealth encounter were myself, my nurse/medical assistant, and my front office/scheduling team member.  Review of Systems: No fevers, chills, night sweats, weight loss, chest pain, or shortness of breath.   Objective Findings:    General: Speaking full sentences, no audible heavy breathing.  Sounds alert and appropriately interactive.    Independent interpretation of tests performed by another provider:   None.  Brief History, Exam, Impression, and Recommendations:    Rotator cuff tear, right Benjamin Leonard is a very pleasant 56 year old male, chronic shoulder pain, glenohumeral injection without relief, ultimately MRI showed 50% supraspinatus tearing, labral injuries, we did a subacromial injection, he has done some physical therapy based on my direction, we did topical nitroglycerin, its been several months, he continues to have pain, at this point he will need rotator cuff repair with Dr. Marlinda Mike per his request, they have an existing care relationship. Return to see me as needed.   I discussed the above  assessment and treatment plan with the patient. The patient was provided an opportunity to ask questions and all were answered. The patient agreed with the plan and demonstrated an understanding of the instructions.   The patient was advised to call back or seek an in-person evaluation if the symptoms worsen or if the condition fails to improve as anticipated.   I provided 30 minutes of verbal and non-verbal time during this encounter date, time was needed to gather information, review chart, records, communicate/coordinate with staff remotely, as well as complete documentation.   ___________________________________________ Gwen Her. Dianah Field, M.D., ABFM., CAQSM. Primary Care and Sports Medicine [facility]  Adjunct Professor of [place] [employer] [facility] [employer]

## 2022-01-15 NOTE — Assessment & Plan Note (Signed)
Benjamin Leonard is a very pleasant 56 year old male, chronic shoulder pain, glenohumeral injection without relief, ultimately MRI showed 50% supraspinatus tearing, labral injuries, we did a subacromial injection, he has done some physical therapy based on my direction, we did topical nitroglycerin, its been several months, he continues to have pain, at this point he will need rotator cuff repair with Dr. Marlinda Leonard per his request, they have an existing care relationship. Return to see me as needed.

## 2022-01-18 ENCOUNTER — Encounter: Payer: Self-pay | Admitting: Sports Medicine

## 2022-02-20 ENCOUNTER — Encounter: Payer: Self-pay | Admitting: Sports Medicine

## 2022-02-21 ENCOUNTER — Ambulatory Visit (INDEPENDENT_AMBULATORY_CARE_PROVIDER_SITE_OTHER): Payer: BC Managed Care – PPO | Admitting: Sports Medicine

## 2022-02-21 DIAGNOSIS — M75111 Incomplete rotator cuff tear or rupture of right shoulder, not specified as traumatic: Secondary | ICD-10-CM

## 2022-02-21 NOTE — Assessment & Plan Note (Signed)
Pleasant 56 year old male returns, he has chronic shoulder pain, he has had several injections including glenohumeral, physical therapy, subacromial, ultimately he had an MRI that showed 50% supraspinatus tearing, labral injuries, unfortunately continues to have pain, he did see the shoulder surgeon Dr. Marlinda Mike who suggested an operation, unfortunately Benjamin Leonard has used all of his short-term disability, so we will look at doing this another year, he is requesting a return to work note today. We also filled out return to work paperwork.

## 2022-02-21 NOTE — Progress Notes (Signed)
    Procedures performed today:    None.  Independent interpretation of notes and tests performed by another provider:   None.  Brief History, Exam, Impression, and Recommendations:    Rotator cuff tear, right Pleasant 56 year old male returns, he has chronic shoulder pain, he has had several injections including glenohumeral, physical therapy, subacromial, ultimately he had an MRI that showed 50% supraspinatus tearing, labral injuries, unfortunately continues to have pain, he did see the shoulder surgeon Dr. Marlinda Mike who suggested an operation, unfortunately Benjamin Leonard has used all of his short-term disability, so we will look at doing this another year, he is requesting a return to work note today. We also filled out return to work paperwork.    ____________________________________________ Gwen Her. Dianah Field, M.D., ABFM., CAQSM., AME. Primary Care and Sports Medicine [facility]  Adjunct Professor of [place] [employer] of Medicine  Risk manager

## 2022-04-06 ENCOUNTER — Ambulatory Visit (INDEPENDENT_AMBULATORY_CARE_PROVIDER_SITE_OTHER): Payer: BC Managed Care – PPO

## 2022-04-06 ENCOUNTER — Ambulatory Visit (INDEPENDENT_AMBULATORY_CARE_PROVIDER_SITE_OTHER): Payer: BC Managed Care – PPO | Admitting: Sports Medicine

## 2022-04-06 DIAGNOSIS — M722 Plantar fascial fibromatosis: Secondary | ICD-10-CM

## 2022-04-06 NOTE — Assessment & Plan Note (Signed)
Pleasant 56 year old male, long history of Planter fasciitis, he is post plantar fascial release in the distant past. Now having a recurrence of pain left-sided plantar heel. He did get some orthotics a long time ago, the thermoplastic has cracked so I do think he needs another set, referral placed to Dr. Raeford Razor. Considering severity of pain we did an injection today at the plantar fascial origin, he had some pain further down but no plantar fascial fibromatosis on exam. I will also add home physical therapy and he can return to see me in about 6 weeks as needed.

## 2022-04-06 NOTE — Progress Notes (Signed)
    Procedures performed today:    Procedure: Real-time Ultrasound Guided injection of the left plantar fascia Device: Samsung HS60  Verbal informed consent obtained.  Time-out conducted.  Noted no overlying erythema, induration, or other signs of local infection.  Skin prepped in a sterile fashion.  Local anesthesia: Topical Ethyl chloride.  With sterile technique and under real time ultrasound guidance: Noted thickened plantar fascia, 1 cc lidocaine, 1 cc bupivacaine, 1 cc kenalog 40 injected easily. Completed without difficulty  Advised to call if fevers/chills, erythema, induration, drainage, or persistent bleeding.  Images permanently stored and available for review in PACS.  Impression: Technically successful ultrasound guided injection.  Independent interpretation of notes and tests performed by another provider:   None.  Brief History, Exam, Impression, and Recommendations:    Plantar fasciitis, left Benjamin Leonard 56 year old male, long history of Planter fasciitis, he is post plantar fascial release in the distant past. Now having a recurrence of pain left-sided plantar heel. He did get some orthotics a long time ago, the thermoplastic has cracked so I do think he needs another set, referral placed to Dr. Raeford Razor. Considering severity of pain we did an injection today at the plantar fascial origin, he had some pain further down but no plantar fascial fibromatosis on exam. I will also add home physical therapy and he can return to see me in about 6 weeks as needed.    ____________________________________________ Gwen Her. Dianah Field, M.D., ABFM., CAQSM., AME. Primary Care and Sports Medicine [facility]  Adjunct Professor of [place] [employer] [facility] of Medicine  Risk manager

## 2022-04-17 ENCOUNTER — Ambulatory Visit (INDEPENDENT_AMBULATORY_CARE_PROVIDER_SITE_OTHER): Payer: BC Managed Care – PPO | Admitting: Family Medicine

## 2022-04-17 ENCOUNTER — Encounter: Payer: Self-pay | Admitting: Family Medicine

## 2022-04-17 DIAGNOSIS — M722 Plantar fascial fibromatosis: Secondary | ICD-10-CM | POA: Diagnosis not present

## 2022-04-17 NOTE — Progress Notes (Signed)
  Benjamin Leonard - 56 y.o. male MRN [id number]  Date of birth: Mar 17, 1966  SUBJECTIVE:  Including CC & ROS.  No chief complaint on file.   Benjamin Leonard is a 56 y.o. male that is presenting with acute left heel pain.  He recently has been having pain at the plantar aspect.  It is worse with a few steps in the morning.   Review of Systems See HPI   HISTORY: Past Medical, Surgical, Social, and Family History Reviewed & Updated per EMR.   Pertinent Historical Findings include:  Past Medical History:  Diagnosis Date   Borderline hypertension    Hearing loss    Right ear w/ hearing aid (new one in 2008)   OAB (overactive bladder)    or BPH?   Ulcerative colitis     Past Surgical History:  Procedure Laterality Date   BICEPS TENDON REPAIR Right 2016   RHINOPLASTY  1997   right ear surgery for scar tissue removal  1996     PHYSICAL EXAM:  VS: Ht '5\' 11"'$  (1.803 m)   Wt 188 lb (85.3 kg)   BMI 26.22 kg/m  Physical Exam Gen: NAD, alert, cooperative with exam, well-appearing MSK:  Neurovascularly intact    Patient was fitted for a standard, cushioned, semi-rigid orthotic. The orthotic was heated and afterward the patient stood on the orthotic blank positioned on the orthotic stand. The patient was positioned in subtalar neutral position and 10 degrees of ankle dorsiflexion in a weight bearing stance. After completion of molding, a stable base was applied to the orthotic blank. The blank was ground to a stable position for weight bearing. Size: 10 Pairs: 2 Base: Blue EVA Additional Posting and Padding: None The patient ambulated these, and they were very comfortable.   ASSESSMENT & PLAN:   Plantar fasciitis, left Acutely occurring.  Symptoms most consistent with Planter fasciitis. -Counseled on home exercise therapy and supportive care. -Orthotics today. -Could consider scaphoid pad.

## 2022-04-17 NOTE — Assessment & Plan Note (Signed)
Acutely occurring.  Symptoms most consistent with Planter fasciitis. -Counseled on home exercise therapy and supportive care. -Orthotics today. -Could consider scaphoid pad.

## 2022-05-04 LAB — HM COLONOSCOPY

## 2022-05-22 ENCOUNTER — Encounter: Payer: Self-pay | Admitting: Family Medicine

## 2022-05-31 ENCOUNTER — Ambulatory Visit (INDEPENDENT_AMBULATORY_CARE_PROVIDER_SITE_OTHER): Payer: BC Managed Care – PPO | Admitting: Sports Medicine

## 2022-05-31 ENCOUNTER — Encounter: Payer: Self-pay | Admitting: Sports Medicine

## 2022-05-31 VITALS — Wt 191.0 lb

## 2022-05-31 DIAGNOSIS — M19071 Primary osteoarthritis, right ankle and foot: Secondary | ICD-10-CM | POA: Diagnosis not present

## 2022-05-31 NOTE — Progress Notes (Signed)
    Procedures performed today:    None.  Independent interpretation of notes and tests performed by another provider:   None.  Brief History, Exam, Impression, and Recommendations:    Arthritis of first metatarsophalangeal (MTP) joint of right foot Historically doing well, unfortunately starting to have increasing pain right first MTP with custom molded orthotics. He does have a tender swollen first MTP, this resembles more osteoarthritis rather than podagra. I added first metatarsal ray post for the right side and gave him an additional first metatarsal reposted placed on the left side if the unevenness was disconcerting. Return to see me as needed, injections always remain an option as do Morton plates.    ____________________________________________ Gwen Her. Dianah Field, M.D., ABFM., CAQSM., AME. Primary Care and Sports Medicine [facility]  Adjunct Professor of [employer] [facility] of Medicine  Risk manager

## 2022-05-31 NOTE — Assessment & Plan Note (Signed)
Historically doing well, unfortunately starting to have increasing pain right first MTP with custom molded orthotics. He does have a tender swollen first MTP, this resembles more osteoarthritis rather than podagra. I added first metatarsal ray post for the right side and gave him an additional first metatarsal reposted placed on the left side if the unevenness was disconcerting. Return to see me as needed, injections always remain an option as do Morton plates.

## 2022-07-02 ENCOUNTER — Encounter: Payer: Self-pay | Admitting: Sports Medicine

## 2022-07-20 ENCOUNTER — Ambulatory Visit (INDEPENDENT_AMBULATORY_CARE_PROVIDER_SITE_OTHER): Payer: BC Managed Care – PPO | Admitting: Sports Medicine

## 2022-07-20 ENCOUNTER — Encounter: Payer: Self-pay | Admitting: Sports Medicine

## 2022-07-20 VITALS — BP 129/86 | HR 69 | Wt 191.0 lb

## 2022-07-20 DIAGNOSIS — E785 Hyperlipidemia, unspecified: Secondary | ICD-10-CM

## 2022-07-20 DIAGNOSIS — N139 Obstructive and reflux uropathy, unspecified: Secondary | ICD-10-CM

## 2022-07-20 MED ORDER — TADALAFIL 5 MG PO TABS: 5.0000 mg | ORAL_TABLET | Freq: Every day | ORAL | 11 refills | Status: DC

## 2022-07-20 NOTE — Assessment & Plan Note (Signed)
Pleasant 56 year old male, he has a long history of pelvic floor dysfunction, obstructive uropathy with mostly obstructive symptoms including hesitancy, weak stream, dribbling and nocturia. He has seen urologist, he did some pelvic floor rehab back in his 23s and 30s that seem to work well, unfortunately he is having worsening of symptoms including some erectile dysfunction. We discussed that the likely cause at his age was an enlarged prostate, he is going to get back in with his urologist but would like some labs including a PSA, happy to do this along with a urinalysis, also adding tadalafil 5 to help both obstructive symptoms and erectile dysfunction. I would like to hear from him in about 6 weeks to see how things are going.

## 2022-07-20 NOTE — Progress Notes (Signed)
    Procedures performed today:    None.  Independent interpretation of notes and tests performed by another provider:   None.  Brief History, Exam, Impression, and Recommendations:    Obstructive uropathy Pleasant 56 year old male, he has a long history of pelvic floor dysfunction, obstructive uropathy with mostly obstructive symptoms including hesitancy, weak stream, dribbling and nocturia. He has seen urologist, he did some pelvic floor rehab back in his 95s and 30s that seem to work well, unfortunately he is having worsening of symptoms including some erectile dysfunction. We discussed that the likely cause at his age was an enlarged prostate, he is going to get back in with his urologist but would like some labs including a PSA, happy to do this along with a urinalysis, also adding tadalafil 5 to help both obstructive symptoms and erectile dysfunction. I would like to hear from him in about 6 weeks to see how things are going.    ____________________________________________ Gwen Her. Dianah Field, M.D., ABFM., CAQSM., AME. Primary Care and Sports Medicine [facility]  Adjunct Professor of [place] [employer] [facility] of Medicine  Risk manager

## 2022-07-24 ENCOUNTER — Encounter: Payer: Self-pay | Admitting: Sports Medicine

## 2022-07-24 LAB — COMPLETE METABOLIC PANEL WITH GFR
AG Ratio: 2 (calc) (ref 1.0–2.5)
ALT: 28 U/L (ref 9–46)
AST: 21 U/L (ref 10–35)
Albumin: 4.7 g/dL (ref 3.6–5.1)
Alkaline phosphatase (APISO): 72 U/L (ref 35–144)
BUN: 15 mg/dL (ref 7–25)
CO2: 30 mmol/L (ref 20–32)
Calcium: 9.6 mg/dL (ref 8.6–10.3)
Chloride: 102 mmol/L (ref 98–110)
Creat: 1.09 mg/dL (ref 0.70–1.30)
Globulin: 2.3 g/dL (calc) (ref 1.9–3.7)
Glucose, Bld: 89 mg/dL (ref 65–139)
Potassium: 4.1 mmol/L (ref 3.5–5.3)
Sodium: 139 mmol/L (ref 135–146)
Total Bilirubin: 0.7 mg/dL (ref 0.2–1.2)
Total Protein: 7 g/dL (ref 6.1–8.1)
eGFR: 79 mL/min/{1.73_m2} (ref >=60)

## 2022-07-24 LAB — CBC WITH DIFFERENTIAL/PLATELET
Absolute Monocytes: 518 cells/uL (ref 200–950)
Basophils Absolute: 32 cells/uL (ref 0–200)
Basophils Relative: 0.7 %
Eosinophils Absolute: 59 cells/uL (ref 15–500)
Eosinophils Relative: 1.3 %
HCT: 45 % (ref 38.5–50.0)
Hemoglobin: 15.5 g/dL (ref 13.2–17.1)
Lymphs Abs: 1328 cells/uL (ref 850–3900)
MCH: 30.7 pg (ref 27.0–33.0)
MCHC: 34.4 g/dL (ref 32.0–36.0)
MCV: 89.1 fL (ref 80.0–100.0)
MPV: 8.9 fL (ref 7.5–12.5)
Monocytes Relative: 11.5 %
Neutro Abs: 2565 cells/uL (ref 1500–7800)
Neutrophils Relative %: 57 %
Platelets: 288 10*3/uL (ref 140–400)
RBC: 5.05 10*6/uL (ref 4.20–5.80)
RDW: 12.3 % (ref 11.0–15.0)
Total Lymphocyte: 29.5 %
WBC: 4.5 10*3/uL (ref 3.8–10.8)

## 2022-07-24 LAB — URINALYSIS W MICROSCOPIC + REFLEX CULTURE
Bacteria, UA: NONE SEEN /HPF
Bilirubin Urine: NEGATIVE
Glucose, UA: NEGATIVE
Hgb urine dipstick: NEGATIVE
Hyaline Cast: NONE SEEN /LPF
Ketones, ur: NEGATIVE
Leukocyte Esterase: NEGATIVE
Nitrites, Initial: NEGATIVE
Protein, ur: NEGATIVE
RBC / HPF: NONE SEEN /HPF (ref 0–2)
Specific Gravity, Urine: 1.02 (ref 1.001–1.035)
Squamous Epithelial / HPF: NONE SEEN /HPF (ref <=5)
WBC, UA: NONE SEEN /HPF (ref 0–5)
pH: 7 (ref 5.0–8.0)

## 2022-07-24 LAB — TSH: TSH: 2.36 mIU/L (ref 0.40–4.50)

## 2022-07-24 LAB — PSA, TOTAL AND FREE
PSA, % Free: 29 % (calc) (ref >25)
PSA, Free: 0.4 ng/mL
PSA, Total: 1.4 ng/mL (ref <=4.0)

## 2022-07-24 LAB — HEMOGLOBIN A1C
Hgb A1c MFr Bld: 5.6 % of total Hgb (ref <5.7)
Mean Plasma Glucose: 114 mg/dL
eAG (mmol/L): 6.3 mmol/L

## 2022-07-24 LAB — LIPID PANEL
Cholesterol: 252 mg/dL — ABNORMAL HIGH (ref <200)
HDL: 57 mg/dL (ref >=40)
LDL Cholesterol (Calc): 168 mg/dL (calc) — ABNORMAL HIGH
Non-HDL Cholesterol (Calc): 195 mg/dL (calc) — ABNORMAL HIGH (ref <130)
Total CHOL/HDL Ratio: 4.4 (calc) (ref <5.0)
Triglycerides: 137 mg/dL (ref <150)

## 2022-07-24 LAB — NO CULTURE INDICATED

## 2022-07-26 ENCOUNTER — Ambulatory Visit: Payer: BC Managed Care – PPO | Admitting: Sports Medicine

## 2022-11-12 ENCOUNTER — Encounter: Payer: Self-pay | Admitting: *Deleted

## 2022-12-11 ENCOUNTER — Ambulatory Visit (INDEPENDENT_AMBULATORY_CARE_PROVIDER_SITE_OTHER): Payer: BC Managed Care – PPO | Admitting: Sports Medicine

## 2022-12-11 DIAGNOSIS — M19071 Primary osteoarthritis, right ankle and foot: Secondary | ICD-10-CM

## 2022-12-11 NOTE — Assessment & Plan Note (Signed)
Pleasant 57 year old male, first MTP osteoarthritis, he improved with custom molded orthotics with first MT ray posting, unfortunately now pain has recurred. We will go ahead and get him set up with restore prosthetics and orthotics per his request, I would also like him to consider a Morton's first ray carbon fiber plate. If all of the above fails we will do a first MTP injection with ultrasound guidance.

## 2022-12-11 NOTE — Progress Notes (Signed)
    Procedures performed today:    None.  Independent interpretation of notes and tests performed by another provider:   None.  Brief History, Exam, Impression, and Recommendations:    Arthritis of first metatarsophalangeal (MTP) joint of right foot Pleasant 57 year old male, first MTP osteoarthritis, he improved with custom molded orthotics with first MT ray posting, unfortunately now pain has recurred. We will go ahead and get him set up with restore prosthetics and orthotics per his request, I would also like him to consider a Morton's first ray carbon fiber plate. If all of the above fails we will do a first MTP injection with ultrasound guidance.    ____________________________________________ Ihor Austin. Benjamin Stain, M.D., ABFM., CAQSM., AME. Primary Care and Sports Medicine [facility]  Adjunct Professor of Family Medicine  [place] [employer] [facility] [employer] of Medicine  Restaurant manager, fast food

## 2022-12-21 ENCOUNTER — Ambulatory Visit (INDEPENDENT_AMBULATORY_CARE_PROVIDER_SITE_OTHER): Payer: BC Managed Care – PPO

## 2022-12-21 ENCOUNTER — Encounter: Payer: Self-pay | Admitting: Sports Medicine

## 2022-12-21 ENCOUNTER — Ambulatory Visit: Payer: BC Managed Care – PPO | Admitting: Sports Medicine

## 2022-12-21 VITALS — BP 127/74 | HR 77

## 2022-12-21 DIAGNOSIS — J029 Acute pharyngitis, unspecified: Secondary | ICD-10-CM | POA: Diagnosis not present

## 2022-12-21 DIAGNOSIS — M2550 Pain in unspecified joint: Secondary | ICD-10-CM | POA: Diagnosis not present

## 2022-12-21 LAB — POCT RAPID STREP A (OFFICE): Rapid Strep A Screen: NEGATIVE

## 2022-12-21 LAB — POCT INFLUENZA A/B
Influenza A, POC: NEGATIVE
Influenza B, POC: NEGATIVE

## 2022-12-21 LAB — POC COVID19 BINAXNOW: SARS Coronavirus 2 Ag: NEGATIVE

## 2022-12-21 NOTE — Addendum Note (Signed)
Addended by: Carren Rang A on: 12/21/2022 04:30 PM   Modules accepted: Orders

## 2022-12-21 NOTE — Progress Notes (Signed)
    Procedures performed today:    None.  Independent interpretation of notes and tests performed by another provider:   None.  Brief History, Exam, Impression, and Recommendations:    Sore throat Benjamin Leonard has a sore throat, cough, chest tightness, achiness and fatigue. Symptoms have been present on and off for several weeks. Exam is benign with the exception of some postnasal drip signs such as pharyngeal erythema and mucus. COVID, flu, strep tests are negative, checking some labs. We will watch this for 2 weeks and if insufficient improvement we will consider imaging of his chest, neck, further treatment of potentially uncontrolled reflux and additional testing for sleep apnea.  Polyarthralgia Benjamin Leonard discontinue doxycycline prescribed by his dermatologist and tells me all of his aches have improved.  We will monitor this for now.  If they come back, then we know it was not the doxycycline causing it. He is achy right now, fatigue, we will check some routine labs, he is lupus and rheumatoid arthritis testing has been negative in the past. If persistent fatigue and achiness after a 2-week week follow-up we will get imaging of his chest and neck.  Update: CK levels are elevated likely explaining the achiness all over.  This can be related to a viral illness which will resolve on its own, this could also be an extraintestinal manifestation of ulcerative colitis, no change in plan for now, this will typically resolve on its own, hydrate aggressively to help clear this through the kidneys.  I spent 40 minutes of total time managing this patient today, this includes chart review, face to face, and non-face to face time.  ____________________________________________ Benjamin Leonard. Benjamin Stain, M.D., ABFM., CAQSM., AME. Primary Care and Sports Medicine [facility] [facility]  Adjunct Professor of Family Medicine  [place] [employer] [facility] of Medicine  Dealer

## 2022-12-21 NOTE — Assessment & Plan Note (Addendum)
Dianne discontinue doxycycline prescribed by his dermatologist and tells me all of his aches have improved.  We will monitor this for now.  If they come back, then we know it was not the doxycycline causing it. He is achy right now, fatigue, we will check some routine labs, he is lupus and rheumatoid arthritis testing has been negative in the past. If persistent fatigue and achiness after a 2-week week follow-up we will get imaging of his chest and neck.  Update: CK levels are elevated likely explaining the achiness all over.  This can be related to a viral illness which will resolve on its own, this could also be an extraintestinal manifestation of ulcerative colitis, no change in plan for now, this will typically resolve on its own, hydrate aggressively to help clear this through the kidneys.

## 2022-12-21 NOTE — Assessment & Plan Note (Addendum)
Benjamin Leonard has a sore throat, cough, chest tightness, achiness and fatigue. Symptoms have been present on and off for several weeks. Exam is benign with the exception of some postnasal drip signs such as pharyngeal erythema and mucus. COVID, flu, strep tests are negative, checking some labs. We will watch this for 2 weeks and if insufficient improvement we will consider imaging of his chest, neck, further treatment of potentially uncontrolled reflux and additional testing for sleep apnea.

## 2022-12-22 LAB — COMPREHENSIVE METABOLIC PANEL
AG Ratio: 2.4 (calc) (ref 1.0–2.5)
ALT: 29 U/L (ref 9–46)
AST: 25 U/L (ref 10–35)
Albumin: 4.8 g/dL (ref 3.6–5.1)
Alkaline phosphatase (APISO): 68 U/L (ref 35–144)
BUN: 18 mg/dL (ref 7–25)
CO2: 28 mmol/L (ref 20–32)
Calcium: 9.8 mg/dL (ref 8.6–10.3)
Chloride: 103 mmol/L (ref 98–110)
Creat: 1.09 mg/dL (ref 0.70–1.30)
Globulin: 2 g/dL (calc) (ref 1.9–3.7)
Glucose, Bld: 91 mg/dL (ref 65–99)
Potassium: 4.7 mmol/L (ref 3.5–5.3)
Sodium: 139 mmol/L (ref 135–146)
Total Bilirubin: 0.7 mg/dL (ref 0.2–1.2)
Total Protein: 6.8 g/dL (ref 6.1–8.1)

## 2022-12-22 LAB — CBC
HCT: 44.1 % (ref 38.5–50.0)
Hemoglobin: 15.1 g/dL (ref 13.2–17.1)
MCH: 30.6 pg (ref 27.0–33.0)
MCHC: 34.2 g/dL (ref 32.0–36.0)
MCV: 89.5 fL (ref 80.0–100.0)
MPV: 8.9 fL (ref 7.5–12.5)
Platelets: 256 10*3/uL (ref 140–400)
RBC: 4.93 10*6/uL (ref 4.20–5.80)
RDW: 12.6 % (ref 11.0–15.0)
WBC: 4 10*3/uL (ref 3.8–10.8)

## 2022-12-22 LAB — TSH: TSH: 1.96 mIU/L (ref 0.40–4.50)

## 2022-12-22 LAB — SEDIMENTATION RATE: Sed Rate: 2 mm/h (ref 0–20)

## 2022-12-22 LAB — URIC ACID: Uric Acid, Serum: 6.2 mg/dL (ref 4.0–8.0)

## 2022-12-22 LAB — CK: Total CK: 226 U/L — ABNORMAL HIGH (ref 44–196)

## 2022-12-24 ENCOUNTER — Encounter: Payer: Self-pay | Admitting: Sports Medicine

## 2022-12-24 DIAGNOSIS — J029 Acute pharyngitis, unspecified: Secondary | ICD-10-CM

## 2022-12-25 MED ORDER — AZITHROMYCIN 250 MG PO TABS
ORAL_TABLET | ORAL | 0 refills | Status: DC
Start: 2022-12-25 — End: 2023-01-23

## 2023-01-11 ENCOUNTER — Ambulatory Visit: Payer: BC Managed Care – PPO | Admitting: Sports Medicine

## 2023-01-11 DIAGNOSIS — M19071 Primary osteoarthritis, right ankle and foot: Secondary | ICD-10-CM | POA: Diagnosis not present

## 2023-01-11 DIAGNOSIS — M67442 Ganglion, left hand: Secondary | ICD-10-CM | POA: Diagnosis not present

## 2023-01-11 NOTE — Assessment & Plan Note (Signed)
2 weeks of pain left hand volar first MCP, no change in activity but he does work out a lot and works for [employer] so multiple repetitive motion type injuries on a daily basis. He will use OTC NSAIDs, home conditioning, minimize use of his thumb for the next 2 weeks and return to see me as needed.

## 2023-01-11 NOTE — Assessment & Plan Note (Signed)
Samrudh has not yet gotten his orthotics however his pain has improved.

## 2023-01-11 NOTE — Progress Notes (Signed)
    Procedures performed today:    None.  Independent interpretation of notes and tests performed by another provider:   None.  Brief History, Exam, Impression, and Recommendations:    Ganglion cyst of flexor pollicis longus tendon sheath of left hand 2 weeks of pain left hand volar first MCP, no change in activity but he does work out a lot and works for [employer] so multiple repetitive motion type injuries on a daily basis. He will use OTC NSAIDs, home conditioning, minimize use of his thumb for the next 2 weeks and return to see me as needed.  Arthritis of first metatarsophalangeal (MTP) joint of right foot Benjamin Leonard has not yet gotten his orthotics however his pain has improved.    ____________________________________________ Ihor Austin. Benjamin Stain, M.D., ABFM., CAQSM., AME. Primary Care and Sports Medicine [facility]  Adjunct Professor of Family Medicine  [place] [employer] [facility] [employer]  Restaurant manager, fast food

## 2023-01-23 ENCOUNTER — Encounter: Payer: Self-pay | Admitting: Family Medicine

## 2023-01-23 ENCOUNTER — Other Ambulatory Visit: Payer: Self-pay | Admitting: Family Medicine

## 2023-01-23 ENCOUNTER — Ambulatory Visit: Payer: BC Managed Care – PPO | Attending: Family Medicine

## 2023-01-23 ENCOUNTER — Ambulatory Visit (INDEPENDENT_AMBULATORY_CARE_PROVIDER_SITE_OTHER): Payer: BC Managed Care – PPO | Admitting: Family Medicine

## 2023-01-23 ENCOUNTER — Ambulatory Visit (INDEPENDENT_AMBULATORY_CARE_PROVIDER_SITE_OTHER): Payer: BC Managed Care – PPO

## 2023-01-23 VITALS — BP 148/91 | HR 72 | Resp 18 | Ht 71.0 in | Wt 188.8 lb

## 2023-01-23 DIAGNOSIS — R079 Chest pain, unspecified: Secondary | ICD-10-CM

## 2023-01-23 DIAGNOSIS — R001 Bradycardia, unspecified: Secondary | ICD-10-CM

## 2023-01-23 LAB — CBC
HCT: 44.5 % (ref 38.5–50.0)
Hemoglobin: 15 g/dL (ref 13.2–17.1)
MCH: 30.3 pg (ref 27.0–33.0)
MCHC: 33.7 g/dL (ref 32.0–36.0)
MCV: 89.9 fL (ref 80.0–100.0)
MPV: 8.9 fL (ref 7.5–12.5)
Platelets: 234 10*3/uL (ref 140–400)
RBC: 4.95 10*6/uL (ref 4.20–5.80)
RDW: 12.3 % (ref 11.0–15.0)
WBC: 4 10*3/uL (ref 3.8–10.8)

## 2023-01-23 LAB — COMPLETE METABOLIC PANEL WITH GFR
AG Ratio: 2 (calc) (ref 1.0–2.5)
ALT: 25 U/L (ref 9–46)
AST: 26 U/L (ref 10–35)
Albumin: 4.7 g/dL (ref 3.6–5.1)
Alkaline phosphatase (APISO): 73 U/L (ref 35–144)
BUN: 16 mg/dL (ref 7–25)
CO2: 30 mmol/L (ref 20–32)
Calcium: 9.6 mg/dL (ref 8.6–10.3)
Chloride: 102 mmol/L (ref 98–110)
Creat: 1.01 mg/dL (ref 0.70–1.30)
Globulin: 2.3 g/dL (calc) (ref 1.9–3.7)
Glucose, Bld: 104 mg/dL — ABNORMAL HIGH (ref 65–99)
Potassium: 4.4 mmol/L (ref 3.5–5.3)
Sodium: 139 mmol/L (ref 135–146)
Total Bilirubin: 0.6 mg/dL (ref 0.2–1.2)
Total Protein: 7 g/dL (ref 6.1–8.1)
eGFR: 86 mL/min/{1.73_m2} (ref >=60)

## 2023-01-23 LAB — TROPONIN I: Troponin I: 3 ng/L (ref <=47)

## 2023-01-23 NOTE — Progress Notes (Addendum)
Acute Office Visit  Subjective:     Patient ID: Benjamin Leonard, male    DOB: 1965-11-03, 57 y.o.   MRN: [id number]  Chief Complaint  Patient presents with   Chest Pain    Pt states he has had chest tightness x3 days    HPI Patient is in today for chest tightness for the past three days. He does have a family history of MI. He feels like a tightness/fullness. Says it feels weird. Says it is borderline feeling congested. Has a hx of reflux but says it doesn't feel like it. No shortness of breath. No jaw pain. No changes in physical activity. No palpitations. Says it is on and off with no correlation with sitting or moving. Says it is extremely mild.    Review of Systems  Constitutional:  Negative for chills and fever.  Respiratory:  Negative for cough and shortness of breath.   Cardiovascular:  Positive for chest pain.  Neurological:  Negative for headaches.        Objective:    BP (!) 148/91 (BP Location: Left Arm, Patient Position: Sitting, Cuff Size: Normal)   Pulse 72   Resp 18   Ht 5\' 11"  (1.803 m)   Wt 188 lb 12 oz (85.6 kg)   BMI 26.33 kg/m    Physical Exam Vitals and nursing note reviewed.  Constitutional:      General: He is not in acute distress.    Appearance: Normal appearance.  HENT:     Head: Normocephalic and atraumatic.     Right Ear: External ear normal.     Left Ear: External ear normal.     Nose: Nose normal.  Eyes:     Conjunctiva/sclera: Conjunctivae normal.  Cardiovascular:     Rate and Rhythm: Normal rate and regular rhythm.     Heart sounds: Normal heart sounds.     Comments: Non-tender to palpation of chest wall Pulmonary:     Effort: Pulmonary effort is normal.     Breath sounds: Normal breath sounds.  Neurological:     General: No focal deficit present.     Mental Status: He is alert and oriented to person, place, and time.  Psychiatric:        Mood and Affect: Mood normal.        Behavior: Behavior normal.        Thought  Content: Thought content normal.        Judgment: Judgment normal.     No results found for any visits on 01/23/23.      Assessment & Plan:   Problem List Items Addressed This Visit       Other   Chest pain - Primary    - 57yo male presents with chest pain - has a hx of hyperlipidemia not on statins - was followed by cardiology back in 2016 and had a stress test done  - echo done in 2016 shows EF of 60-65% - EKG done in clinic today due to concern for MI. Previous EKG from 2016 reviewed EKG shows normal EKG with sinus bradycardia. No concern for NSTEMI or STEMi - will go ahead and do a full cardiac workup including blood work and cxr - counseled pt if chest pain worsens to go to the ED      Relevant Orders   EKG 12-Lead   CBC   COMPLETE METABOLIC PANEL WITH GFR   Troponin I   DG Chest 2 View    Addendum:  blood work all normal. Negative troponin will refer to cards and do a holter.  No orders of the defined types were placed in this encounter.   Return if symptoms worsen or fail to improve.  Charlton Amor, DO

## 2023-01-23 NOTE — Progress Notes (Unsigned)
Enrolled for [employer] to mail a ZIO XT long term holter monitor to the patients address on file.   DOD to read. 

## 2023-01-23 NOTE — Assessment & Plan Note (Addendum)
-   57yo male presents with chest pain - has a hx of hyperlipidemia not on statins - was followed by cardiology back in 2016 and had a stress test done  - echo done in 2016 shows EF of 60-65% - EKG done in clinic today due to concern for MI. Previous EKG from 2016 reviewed EKG shows normal EKG with sinus bradycardia. No concern for NSTEMI or STEMi - will go ahead and do a full cardiac workup including blood work and cxr - counseled pt if chest pain worsens to go to the ED

## 2023-01-23 NOTE — Addendum Note (Signed)
Addended by: Charlton Amor on: 01/23/2023 04:25 PM   Modules accepted: Orders

## 2023-02-07 DIAGNOSIS — R079 Chest pain, unspecified: Secondary | ICD-10-CM

## 2023-02-07 DIAGNOSIS — R001 Bradycardia, unspecified: Secondary | ICD-10-CM | POA: Diagnosis not present

## 2023-02-22 ENCOUNTER — Ambulatory Visit: Payer: BC Managed Care – PPO | Admitting: Sports Medicine

## 2023-02-22 ENCOUNTER — Other Ambulatory Visit (INDEPENDENT_AMBULATORY_CARE_PROVIDER_SITE_OTHER): Payer: BC Managed Care – PPO

## 2023-02-22 DIAGNOSIS — I491 Atrial premature depolarization: Secondary | ICD-10-CM | POA: Diagnosis not present

## 2023-02-22 DIAGNOSIS — M67442 Ganglion, left hand: Secondary | ICD-10-CM

## 2023-02-22 NOTE — Assessment & Plan Note (Signed)
Kainin returns, he is a pleasant 57 year old male, persistent pain left flexor pollicis longus, injected today, return to see me as needed.

## 2023-02-22 NOTE — Assessment & Plan Note (Addendum)
Benjamin Leonard is a pleasant 57 year old male, seen by my partner several weeks ago, he was having intermittent precordial chest discomfort, he does recall doing a heavy pectoralis workout set, and then having some increasing discomfort. It was not exertional, no shortness of breath, he did have occasional skipped beats. He had some labs including CBC, CMP and cardiac enzymes all of which were normal. Twelve-lead ECG was unrevealing, ambulatory rhythm monitoring was done, he was noted to have a few PACs and a single 4 beat run of SVT. I explained to him that these were fairly normal findings, considering his ability to work out at a high level I doubt that any of his symptoms are anginal, he did also have a stress echo in 2016 that was normal.  He is a non-smoker, normal A1c, normal blood pressure, his main risk factor is his lipids which we will discuss at follow-up visit. I did offer beta-blocker treatment but he is able to tolerate his symptoms for now so we will hold off on additional treatment, he also has appointment coming up with his cardiologist. I would certainly recommend addition of Crestor.

## 2023-02-22 NOTE — Progress Notes (Signed)
    Procedures performed today:    Procedure: Real-time Ultrasound Guided injection of the left flexor pollicis longus tendon sheath Device: Samsung HS60  Verbal informed consent obtained.  Time-out conducted.  Noted no overlying erythema, induration, or other signs of local infection.  Skin prepped in a sterile fashion.  Local anesthesia: Topical Ethyl chloride.  With sterile technique and under real time ultrasound guidance: Noted small left FPL nodule, 1/2 cc lidocaine, 1/2 cc kenalog 40 injected easily. Completed without difficulty  Advised to call if fevers/chills, erythema, induration, drainage, or persistent bleeding.  Images permanently stored and available for review in PACS.  Impression: Technically successful ultrasound guided injection.  Independent interpretation of notes and tests performed by another provider:   None.  Brief History, Exam, Impression, and Recommendations:    Ganglion cyst of flexor pollicis longus tendon sheath of left hand Free returns, he is a pleasant 57 year old male, persistent pain left flexor pollicis longus, injected today, return to see me as needed.  Premature atrial contractions Benjamin Leonard is a pleasant 58 year old male, seen by my partner several weeks ago, he was having intermittent precordial chest discomfort, he does recall doing a heavy pectoralis workout set, and then having some increasing discomfort. It was not exertional, no shortness of breath, he did have occasional skipped beats. He had some labs including CBC, CMP and cardiac enzymes all of which were normal. Twelve-lead ECG was unrevealing, ambulatory rhythm monitoring was done, he was noted to have a few PACs and a single 4 beat run of SVT. I explained to him that these were fairly normal findings, considering his ability to work out at a high level I doubt that any of his symptoms are anginal, he did also have a stress echo in 2016 that was normal.  He is a non-smoker, normal  A1c, normal blood pressure, his main risk factor is his lipids which we will discuss at follow-up visit. I did offer beta-blocker treatment but he is able to tolerate his symptoms for now so we will hold off on additional treatment, he also has appointment coming up with his cardiologist. I would certainly recommend addition of Crestor.    ____________________________________________ Benjamin Leonard. Benjamin Leonard, M.D., ABFM., CAQSM., AME. Primary Care and Sports Medicine [facility]  Adjunct Professor of Family Medicine  [place] [employer] [facility] [employer]  Restaurant manager, fast food

## 2023-03-27 NOTE — Progress Notes (Deleted)
Referring-Erika Tamera Punt, DO Reason for referral-chest pain  HPI: 57 year old male for evaluation of chest pain at request of Morey Hummingbird, DO.  Exercise treadmill February 2016 normal.  Echocardiogram 2016 showed normal LV function, grade 1 diastolic dysfunction.  Monitor July 2024 showed sinus rhythm with rare PAC, 1 4 beat run of SVT and rare PVC.  Current Outpatient Medications  Medication Sig Dispense Refill   clobetasol (TEMOVATE) 0.05 % external solution APPLY TO AFFECTED AREA TWICE A DAY AS NEEDED     esomeprazole (NEXIUM) 40 MG capsule Take 1 capsule by mouth daily.     famotidine (PEPCID) 40 MG tablet Take 40 mg by mouth 2 (two) times daily.     mesalamine (LIALDA) 1.2 G EC tablet Take by mouth. Take 4 tablets by mouth daily.     Multiple Vitamin (MULTIVITAMIN) tablet Take 1 tablet by mouth daily.     tadalafil (CIALIS) 5 MG tablet Take 1 tablet (5 mg total) by mouth daily. 30 tablet 11   No current facility-administered medications for this visit.    Allergies  Allergen Reactions   Balsam Rash    Positive Patch Test 09/07/21   Cobalt Rash    Positive Patch Test 09/07/21   Quaternium-15 Rash    Positive Patch Test 09/07/21     Past Medical History:  Diagnosis Date   Borderline hypertension    Hearing loss    Right ear w/ hearing aid (new one in 2008)   OAB (overactive bladder)    or BPH?   Ulcerative colitis     Past Surgical History:  Procedure Laterality Date   BICEPS TENDON REPAIR Right 2016   RHINOPLASTY  1997   right ear surgery for scar tissue removal  1996    Social History   Socioeconomic History   Marital status: Married    Spouse name: [name]   Number of children: 2   Years of education: Not on file   Highest education level: Bachelor's degree (e.g., BA, AB, BS)  Occupational History   Occupation: Teacher, early years/pre, Product/process development scientist, also works for [employer]  Tobacco Use   Smoking status: Never   Smokeless tobacco: Never  Vaping Use   Vaping status: Never  Used  Substance and Sexual Activity   Alcohol use: Yes    Alcohol/week: 0.0 standard drinks of alcohol    Comment: rare    Drug use: No   Sexual activity: Not on file  Other Topics Concern   Not on file  Social History Narrative   Moved from [place] in 2008. Mom lives with them.   Regular exercise:  Works out 3 days a week.   2 children   12 and 7   Daughter 89 is type I diabetic      Mother has platelet disorder (thrombocytosis)      Maternal grand parents - DM II   Social Determinants of Health   Financial Resource Strain: Low Risk  (12/11/2022)   Overall Financial Resource Strain (CARDIA)    Difficulty of Paying Living Expenses: Not hard at all  Food Insecurity: No Food Insecurity (12/11/2022)   Hunger Vital Sign    Worried About Running Out of Food in the Last Year: Never true    Ran Out of Food in the Last Year: Never true  Transportation Needs: No Transportation Needs (12/11/2022)   PRAPARE - Administrator, Civil Service (Medical): No    Lack of Transportation (Non-Medical): No  Physical Activity: Sufficiently  Active (12/11/2022)   Exercise Vital Sign    Days of Exercise per Week: 4 days    Minutes of Exercise per Session: 40 min  Stress: No Stress Concern Present (12/11/2022)   [employer] of Occupational Health - Occupational Stress Questionnaire    Feeling of Stress : Not at all  Social Connections: Moderately Isolated (12/11/2022)   Social Connection and Isolation Panel [NHANES]    Frequency of Communication with Friends and Family: Twice a week    Frequency of Social Gatherings with Friends and Family: Once a week    Attends Religious Services: Never    Database administrator or Organizations: No    Attends Engineer, structural: Not on file    Marital Status: Married  Catering manager Violence: Not At Risk (06/13/2022)   Received from [employer], [facility]   HITS    Over the last 12 months how often did your partner physically hurt  you?: 1    Over the last 12 months how often did your partner insult you or talk down to you?: 1    Over the last 12 months how often did your partner threaten you with physical harm?: 1    Over the last 12 months how often did your partner scream or curse at you?: 1    Family History  Problem Relation Age of Onset   Hyperlipidemia Mother    Hypertension Mother    Hypertension Father    Sudden death Father    Hypertension Brother    Diabetes Brother    Arrhythmia Brother    Sudden death Maternal Grandmother    Diabetes Maternal Grandmother    Diabetes Maternal Grandfather    Gout Brother    Arthritis Brother    Healthy Son    Diabetes Daughter    Healthy Daughter    Heart attack Neg Hx     ROS: no fevers or chills, productive cough, hemoptysis, dysphasia, odynophagia, melena, hematochezia, dysuria, hematuria, rash, seizure activity, orthopnea, PND, pedal edema, claudication. Remaining systems are negative.  Physical Exam:   There were no vitals taken for this visit.  General:  Well developed/well nourished in NAD Skin warm/dry Patient not depressed No peripheral clubbing Back-normal HEENT-normal/normal eyelids Neck supple/normal carotid upstroke bilaterally; no bruits; no JVD; no thyromegaly chest - CTA/ normal expansion CV - RRR/normal S1 and S2; no murmurs, rubs or gallops;  PMI nondisplaced Abdomen -NT/ND, no HSM, no mass, + bowel sounds, no bruit 2+ femoral pulses, no bruits Ext-no edema, chords, 2+ DP Neuro-grossly nonfocal  ECG -January 23, 2023-sinus bradycardia with no ST changes.  Personally reviewed  A/P  1 chest pain-  2 hyperlipidemia-  3 history of hypertension-  Olga Millers, MD

## 2023-04-08 ENCOUNTER — Ambulatory Visit: Payer: BC Managed Care – PPO | Admitting: Cardiology

## 2023-04-17 ENCOUNTER — Encounter: Payer: Self-pay | Admitting: Sports Medicine

## 2023-04-17 ENCOUNTER — Ambulatory Visit: Payer: BC Managed Care – PPO | Admitting: Sports Medicine

## 2023-04-17 ENCOUNTER — Ambulatory Visit (INDEPENDENT_AMBULATORY_CARE_PROVIDER_SITE_OTHER): Payer: BC Managed Care – PPO

## 2023-04-17 DIAGNOSIS — Z23 Encounter for immunization: Secondary | ICD-10-CM | POA: Diagnosis not present

## 2023-04-17 DIAGNOSIS — I82811 Embolism and thrombosis of superficial veins of right lower extremities: Secondary | ICD-10-CM | POA: Diagnosis not present

## 2023-04-17 DIAGNOSIS — M7989 Other specified soft tissue disorders: Secondary | ICD-10-CM

## 2023-04-17 NOTE — Patient Instructions (Signed)
Calf eccentric conditioning program Begin with easy walking, heel, toe and backwards  Do calf raises on a step:  First lower and then raise on 1 foot  If this is painful, lower on 1 foot, do the heel raise on both feet Begin with 3 sets of 10 repetitions  Increase by 5 repetitions every 3 days  Goal is 3 sets of 30 repetitions  Do with both straight knee and knee at 20 degrees of flexion  If pain persists, once you can do 3 sets of 30 without weight, add backpack with 5 lbs.  Increase by 5 lbs per week to max of 30 lbs for 3 sets of 15

## 2023-04-17 NOTE — Assessment & Plan Note (Addendum)
Pleasant 57 year old male, he does have recurrent gastrocnemius strains, more recently a week ago was doing P90 X, felt a strain right gastrocnemius lateral musculotendinous junction. He then has had increasing swelling, somewhat better with compression. On exam he has tenderness at the musculotendinous junction, a minimally positive Homans' sign, obvious visible swelling compared to the contralateral side, negative Thompson's test. Suspect gastroc strain, adding heel lifts, he will continue compression wraps, adding eccentric conditioning to be started in a few weeks. Due to the positive Denna Haggard' sign we will get a DVT ultrasound today. Return to see me if still having discomfort in about 3 to 4 weeks.  Update: Ultrasound does show a superficial venous thrombosis of the right lesser saphenous vein with no extension into the deep venous system, patient is already on mesalamine, no further NSAIDs, I have recommended compression stockings toe to below the knee, medium compression.

## 2023-04-17 NOTE — Progress Notes (Addendum)
    Procedures performed today:    None.  Independent interpretation of notes and tests performed by another provider:   None.  Brief History, Exam, Impression, and Recommendations:    Acute superficial venous thrombosis of right lower extremity Pleasant 57 year old male, he does have recurrent gastrocnemius strains, more recently a week ago was doing P90 X, felt a strain right gastrocnemius lateral musculotendinous junction. He then has had increasing swelling, somewhat better with compression. On exam he has tenderness at the musculotendinous junction, a minimally positive Homans' sign, obvious visible swelling compared to the contralateral side, negative Thompson's test. Suspect gastroc strain, adding heel lifts, he will continue compression wraps, adding eccentric conditioning to be started in a few weeks. Due to the positive Denna Haggard' sign we will get a DVT ultrasound today. Return to see me if still having discomfort in about 3 to 4 weeks.  Update: Ultrasound does show a superficial venous thrombosis of the right lesser saphenous vein with no extension into the deep venous system, patient is already on mesalamine, no further NSAIDs, I have recommended compression stockings toe to below the knee, medium compression.  I spent 30 minutes of total time managing this patient today, this includes chart review, face to face, and non-face to face time.  ____________________________________________ Ihor Austin. Benjamin Stain, M.D., ABFM., CAQSM., AME. Primary Care and Sports Medicine [facility]  Adjunct Professor of Family Medicine  [place] [employer] [facility] [employer]  Restaurant manager, fast food

## 2023-05-01 ENCOUNTER — Ambulatory Visit (INDEPENDENT_AMBULATORY_CARE_PROVIDER_SITE_OTHER): Payer: BC Managed Care – PPO | Admitting: Cardiology

## 2023-05-01 ENCOUNTER — Encounter: Payer: Self-pay | Admitting: Cardiology

## 2023-05-01 VITALS — BP 131/84 | HR 72 | Ht 71.0 in | Wt 193.0 lb

## 2023-05-01 DIAGNOSIS — R072 Precordial pain: Secondary | ICD-10-CM

## 2023-05-01 MED ORDER — METOPROLOL TARTRATE 100 MG PO TABS: ORAL_TABLET | ORAL | 0 refills | Status: DC

## 2023-05-01 NOTE — Patient Instructions (Signed)
    Testing/Procedures:    Your cardiac CT will be scheduled at   [facility] [address] [place], [place] [address] [phone]   If scheduled at [facility], please arrive at the [facility] En[facility] (Entrance C2) of [facility] 30 minutes prior to test start time. You can use the FREE valet parking offered at entrance C (encouraged to control the heart rate for the test)  Proceed to the [facility] Radiology Department (first floor) to check-in and test prep.  All radiology patients and guests should use entrance C2 at [facility], accessed from [facility], even though the hospital's physical address listed is [address]      Please follow these instructions carefully (unless otherwise directed):  An IV will be required for this test and Nitroglycerin will be given.  Hold all erectile dysfunction medications at least 3 days (72 hrs) prior to test. (Ie viagra, cialis, sildenafil, tadalafil, etc)   On the Night Before the Test: Be sure to Drink plenty of water. Do not consume any caffeinated/decaffeinated beverages or chocolate 12 hours prior to your test. Do not take any antihistamines 12 hours prior to your test.   On the Day of the Test: Drink plenty of water until 1 hour prior to the test. Do not eat any food 1 hour prior to test. You may take your regular medications prior to the test.  Take metoprolol (Lopressor) 100 mg two hours prior to test.  After the Test: Drink plenty of water. After receiving IV contrast, you may experience a mild flushed feeling. This is normal. On occasion, you may experience a mild rash up to 24 hours after the test. This is not dangerous. If this occurs, you can take Benadryl 25 mg and increase your fluid intake. If you experience trouble breathing, this can be serious. If it is severe call 911 IMMEDIATELY. If it is mild, please call our office.   We will  call to schedule your test 2-4 weeks out understanding that some insurance companies will need an authorization prior to the service being performed.   For more information and frequently asked questions, please visit our website : [email or web address]  For non-scheduling related questions, please contact the cardiac imaging nurse navigator should you have any questions/concerns: Cardiac Imaging Nurse Navigators Direct Office Dial: [phone]   For scheduling needs, including cancellations and rescheduling, please call [place], [phone].    Follow-Up: At [facility], you and your health needs are our priority.  As part of our continuing mission to provide you with exceptional heart care, we have created designated Provider Care Teams.  These Care Teams include your primary Cardiologist (physician) and Advanced Practice Providers (APPs -  Physician Assistants and Nurse Practitioners) who all work together to provide you with the care you need, when you need it.  We recommend signing up for the patient portal called "MyChart".  Sign up information is provided on this After Visit Summary.  MyChart is used to connect with patients for Virtual Visits (Telemedicine).  Patients are able to view lab/test results, encounter notes, upcoming appointments, etc.  Non-urgent messages can be sent to your provider as well.   To learn more about what you can do with MyChart, go to [email or web address].    Your next appointment:   12 month(s)  Provider:   Olga Millers, MD

## 2023-05-01 NOTE — Progress Notes (Signed)
Referring-Erika Tamera Punt, DO Reason for referral-chest pain  HPI: 57 year old male for evaluation of chest pain at request of Morey Hummingbird, DO.  Seen previously but not since 2016.  Echocardiogram February 2016 showed normal LV function and grade 1 diastolic dysfunction.  Patient seen with chest pain June 2024.  Troponin was normal, hemoglobin 15, creatinine 1.01, normal liver functions.  Monitor July 2024 showed sinus rhythm with rare PAC, 4 beat run of SVT and rare PVC.  Venous Dopplers September 2024 showed no DVT.  There was note of age-indeterminate superficial thrombophlebitis in the right lesser saphenous vein.  Patient states that he has occasional chest tightness.  It is in the substernal/left upper chest area.  It can last 30 minutes to 2 hours.  It is not pleuritic or positional and not exertional.  Resolved spontaneously.  No associated symptoms.  He does not have exertional chest pain.  He denies dyspnea on exertion, orthopnea, PND, pedal edema or syncope.  Occasional palpitations.  Cardiology now asked to evaluate.  Current Outpatient Medications  Medication Sig Dispense Refill   clobetasol (TEMOVATE) 0.05 % external solution APPLY TO AFFECTED AREA TWICE A DAY AS NEEDED     esomeprazole (NEXIUM) 40 MG capsule Take 1 capsule by mouth daily.     famotidine (PEPCID) 40 MG tablet Take 40 mg by mouth 2 (two) times daily.     mesalamine (LIALDA) 1.2 G EC tablet Take by mouth. Take 4 tablets by mouth daily.     Multiple Vitamin (MULTIVITAMIN) tablet Take 1 tablet by mouth daily.     tadalafil (CIALIS) 5 MG tablet Take 1 tablet (5 mg total) by mouth daily. 30 tablet 11   No current facility-administered medications for this visit.    Allergies  Allergen Reactions   Balsam Rash    Positive Patch Test 09/07/21   Cobalt Rash    Positive Patch Test 09/07/21   Quaternium-15 Rash    Positive Patch Test 09/07/21     Past Medical History:  Diagnosis Date   Borderline hypertension     Hearing loss    Right ear w/ hearing aid (new one in 2008)   Hyperlipidemia    OAB (overactive bladder)    or BPH?   Ulcerative colitis     Past Surgical History:  Procedure Laterality Date   BACK SURGERY     BICEPS TENDON REPAIR Right 2016   RHINOPLASTY  1997   right ear surgery for scar tissue removal  1996    Social History   Socioeconomic History   Marital status: Married    Spouse name: [name]   Number of children: 2   Years of education: Not on file   Highest education level: Bachelor's degree (e.g., BA, AB, BS)  Occupational History   Occupation: Teacher, early years/pre, Product/process development scientist, also works for [employer]  Tobacco Use   Smoking status: Never   Smokeless tobacco: Never  Vaping Use   Vaping status: Never Used  Substance and Sexual Activity   Alcohol use: Yes    Alcohol/week: 0.0 standard drinks of alcohol    Comment: rare    Drug use: No   Sexual activity: Not on file  Other Topics Concern   Not on file  Social History Narrative   Moved from [place] in 2008. Mom lives with them.   Regular exercise:  Works out 3 days a week.   2 children   12 and 7   Daughter 12 is type I diabetic  Mother has platelet disorder (thrombocytosis)      Maternal grand parents - DM II   Social Determinants of Health   Financial Resource Strain: Low Risk  (12/11/2022)   Overall Financial Resource Strain (CARDIA)    Difficulty of Paying Living Expenses: Not hard at all  Food Insecurity: No Food Insecurity (12/11/2022)   Hunger Vital Sign    Worried About Running Out of Food in the Last Year: Never true    Ran Out of Food in the Last Year: Never true  Transportation Needs: No Transportation Needs (12/11/2022)   PRAPARE - Administrator, Civil Service (Medical): No    Lack of Transportation (Non-Medical): No  Physical Activity: Sufficiently Active (12/11/2022)   Exercise Vital Sign    Days of Exercise per Week: 4 days    Minutes of Exercise per Session: 40 min  Stress: No Stress  Concern Present (12/11/2022)   [employer] of Occupational Health - Occupational Stress Questionnaire    Feeling of Stress : Not at all  Social Connections: Moderately Isolated (12/11/2022)   Social Connection and Isolation Panel [NHANES]    Frequency of Communication with Friends and Family: Twice a week    Frequency of Social Gatherings with Friends and Family: Once a week    Attends Religious Services: Never    Database administrator or Organizations: No    Attends Engineer, structural: Not on file    Marital Status: Married  Catering manager Violence: Not At Risk (06/13/2022)   Received from [employer], [facility]   HITS    Over the last 12 months how often did your partner physically hurt you?: 1    Over the last 12 months how often did your partner insult you or talk down to you?: 1    Over the last 12 months how often did your partner threaten you with physical harm?: 1    Over the last 12 months how often did your partner scream or curse at you?: 1    Family History  Problem Relation Age of Onset   Hyperlipidemia Mother    Hypertension Mother    Hypertension Father    Sudden death Father    Hypertension Brother    Diabetes Brother    Arrhythmia Brother    Sudden death Maternal Grandmother    Diabetes Maternal Grandmother    Diabetes Maternal Grandfather    Gout Brother    Arthritis Brother    Healthy Son    Diabetes Daughter    Healthy Daughter    Heart attack Neg Hx     ROS: no fevers or chills, productive cough, hemoptysis, dysphasia, odynophagia, melena, hematochezia, dysuria, hematuria, rash, seizure activity, orthopnea, PND, pedal edema, claudication. Remaining systems are negative.  Physical Exam:   Blood pressure 131/84, pulse 72, height 5\' 11"  (1.803 m), weight 193 lb (87.5 kg), SpO2 99%.  General:  Well developed/well nourished in NAD Skin warm/dry Patient not depressed No peripheral clubbing Back-normal HEENT-normal/normal  eyelids Neck supple/normal carotid upstroke bilaterally; no bruits; no JVD; no thyromegaly chest - CTA/ normal expansion CV - RRR/normal S1 and S2; no murmurs, rubs or gallops;  PMI nondisplaced Abdomen -NT/ND, no HSM, no mass, + bowel sounds, no bruit 2+ femoral pulses, no bruits Ext-no edema, chords, 2+ DP Neuro-grossly nonfocal  ECG -January 23, 2023-sinus bradycardia with no ST changes.  Personally reviewed  A/P  1 chest pain-symptoms are atypical and electrocardiogram shows no ST changes.  He does  have a family history of coronary disease.  I will arrange a coronary CTA to rule out obstructive coronary disease.  2 hyperlipidemia-if he has any degree of plaque we will recommend a statin.  He is somewhat hesitant but will consider this.  He also states he will work on his diet.  Olga Millers, MD

## 2023-05-07 ENCOUNTER — Encounter (HOSPITAL_COMMUNITY): Payer: Self-pay

## 2023-05-09 ENCOUNTER — Ambulatory Visit (HOSPITAL_COMMUNITY)
Admission: RE | Admit: 2023-05-09 | Discharge: 2023-05-09 | Disposition: A | Discharge status: Home / Self Care | Payer: BC Managed Care – PPO | Source: Ambulatory Visit | Attending: Cardiology | Admitting: Cardiology

## 2023-05-09 DIAGNOSIS — R072 Precordial pain: Secondary | ICD-10-CM

## 2023-05-09 MED ORDER — NITROGLYCERIN 0.4 MG SL SUBL
SUBLINGUAL_TABLET | SUBLINGUAL | Status: AC
  Filled 2023-05-09: qty 2

## 2023-05-09 MED ORDER — IOHEXOL 350 MG/ML SOLN
95.0000 mL | Freq: Once | INTRAVENOUS | Status: AC | PRN
  Administered 2023-05-09: 95 mL via INTRAVENOUS

## 2023-05-09 MED ORDER — NITROGLYCERIN 0.4 MG SL SUBL
0.8000 mg | SUBLINGUAL_TABLET | Freq: Once | SUBLINGUAL | Status: AC
  Administered 2023-05-09: 0.8 mg via SUBLINGUAL

## 2023-05-13 ENCOUNTER — Telehealth: Payer: Self-pay | Admitting: *Deleted

## 2023-05-13 DIAGNOSIS — I2510 Atherosclerotic heart disease of native coronary artery without angina pectoris: Secondary | ICD-10-CM

## 2023-05-13 MED ORDER — ROSUVASTATIN CALCIUM 20 MG PO TABS
20.0000 mg | ORAL_TABLET | Freq: Every day | ORAL | 3 refills | Status: DC
Start: 2023-05-13 — End: 2023-09-19

## 2023-05-13 MED ORDER — ASPIRIN 81 MG PO TBEC
81.0000 mg | DELAYED_RELEASE_TABLET | Freq: Every day | ORAL | Status: DC
Start: 2023-05-13 — End: 2023-09-19

## 2023-05-13 NOTE — Telephone Encounter (Signed)
-----   Message from Olga Millers sent at 05/11/2023  5:52 AM EDT ----- Mild disease; add ASA 81 mg daily; add crestor 20 mg daily; lipids and liver 8 weeks Olga Millers

## 2023-05-13 NOTE — Telephone Encounter (Signed)
pt aware of results  New script sent to the pharmacy  Lab orders mailed to the pt

## 2023-05-16 ENCOUNTER — Encounter: Payer: Self-pay | Admitting: Sports Medicine

## 2023-05-16 ENCOUNTER — Ambulatory Visit: Payer: BC Managed Care – PPO | Admitting: Sports Medicine

## 2023-05-16 VITALS — BP 126/77 | HR 66 | Ht 71.0 in | Wt 193.0 lb

## 2023-05-16 DIAGNOSIS — R0789 Other chest pain: Secondary | ICD-10-CM | POA: Diagnosis not present

## 2023-05-16 DIAGNOSIS — K449 Diaphragmatic hernia without obstruction or gangrene: Secondary | ICD-10-CM

## 2023-05-16 DIAGNOSIS — I82811 Embolism and thrombosis of superficial veins of right lower extremities: Secondary | ICD-10-CM | POA: Diagnosis not present

## 2023-05-16 MED ORDER — COLCHICINE 0.6 MG PO TABS
0.6000 mg | ORAL_TABLET | Freq: Two times a day (BID) | ORAL | 0 refills | Status: DC
Start: 2023-05-16 — End: 2023-10-11

## 2023-05-16 NOTE — Assessment & Plan Note (Signed)
Symptoms continue be resolved with Nexium. He is working with his gastroenterologist, pill esophagitis was also suggested as a potential etiology of his chest discomfort however I think this is less likely, I suspect pleuritis is the most likely etiology here, we will defer management to GI.

## 2023-05-16 NOTE — Assessment & Plan Note (Signed)
Benjamin Leonard does continue to have anterior left-sided chest wall pain, he notes the pain is worse when taking deep breaths. He has seen his cardiologist, he had a coronary CT that was for the most part unrevealing with age-appropriate changes. It was suggested that he start a statin and aspirin, I have also echoed these suggestions and explained to him the importance of risk factor modification with statin medication. He is adverse to using a statin, I did explain the risks to him. On exam he really does not have any tenderness along the costal cartilage, and palpation along the left costal cartilage feels about the same is palpation along the right suggesting that this is not costochondritis. As his chest pain is pleuritic, and there is no evidence of DVT I suspect this may be related to pleurisy/pleuritis. I explained the etiology to him. Adding colchicine for a week, as he is already on an NSAID for colitis, I would like pulmonology to weigh in.

## 2023-05-16 NOTE — Assessment & Plan Note (Signed)
Calf pain is improved, he did have a superficial venous thrombosis to the right lesser saphenous vein, we suggested continuance of his mesalamine without further NSAIDs, compression stockings and massage, doing well. No further imaging needed. No evidence of DVT.

## 2023-05-16 NOTE — Progress Notes (Signed)
    Procedures performed today:    None.  Independent interpretation of notes and tests performed by another provider:   None.  Brief History, Exam, Impression, and Recommendations:    Hiatal hernia Symptoms continue be resolved with Nexium. He is working with his gastroenterologist, pill esophagitis was also suggested as a potential etiology of his chest discomfort however I think this is less likely, I suspect pleuritis is the most likely etiology here, we will defer management to GI.   Acute superficial venous thrombosis of right lower extremity Calf pain is improved, he did have a superficial venous thrombosis to the right lesser saphenous vein, we suggested continuance of his mesalamine without further NSAIDs, compression stockings and massage, doing well. No further imaging needed. No evidence of DVT.  Left-sided chest wall pain Laronn does continue to have anterior left-sided chest wall pain, he notes the pain is worse when taking deep breaths. He has seen his cardiologist, he had a coronary CT that was for the most part unrevealing with age-appropriate changes. It was suggested that he start a statin and aspirin, I have also echoed these suggestions and explained to him the importance of risk factor modification with statin medication. He is adverse to using a statin, I did explain the risks to him. On exam he really does not have any tenderness along the costal cartilage, and palpation along the left costal cartilage feels about the same is palpation along the right suggesting that this is not costochondritis. As his chest pain is pleuritic, and there is no evidence of DVT I suspect this may be related to pleurisy/pleuritis. I explained the etiology to him. Adding colchicine for a week, as he is already on an NSAID for colitis, I would like pulmonology to weigh in.  I spent 30 minutes of total time managing this patient today, this includes chart review, face to face, and  non-face to face time.  ____________________________________________ Ihor Austin. Benjamin Stain, M.D., ABFM., CAQSM., AME. Primary Care and Sports Medicine [facility]  Adjunct Professor of Family Medicine  [place] [employer] [facility] [employer]  Restaurant manager, fast food

## 2023-05-17 NOTE — Telephone Encounter (Signed)
January seems pretty late for a pulmonary consult, anyway to get this moved up sooner?

## 2023-06-05 ENCOUNTER — Encounter: Payer: Self-pay | Admitting: Sports Medicine

## 2023-08-05 ENCOUNTER — Encounter: Payer: Self-pay | Admitting: *Deleted

## 2023-08-07 ENCOUNTER — Institutional Professional Consult (permissible substitution): Payer: BC Managed Care – PPO | Admitting: Internal Medicine

## 2023-09-11 IMAGING — DX DG KNEE COMPLETE 4+V*L*
4 series · 4 of 4 positions shown · non-contrast
Comparison: None.

CLINICAL DATA: Chronic left knee pain, initial encounter

EXAM:
LEFT KNEE - COMPLETE 4+ VIEW

[knee lat]
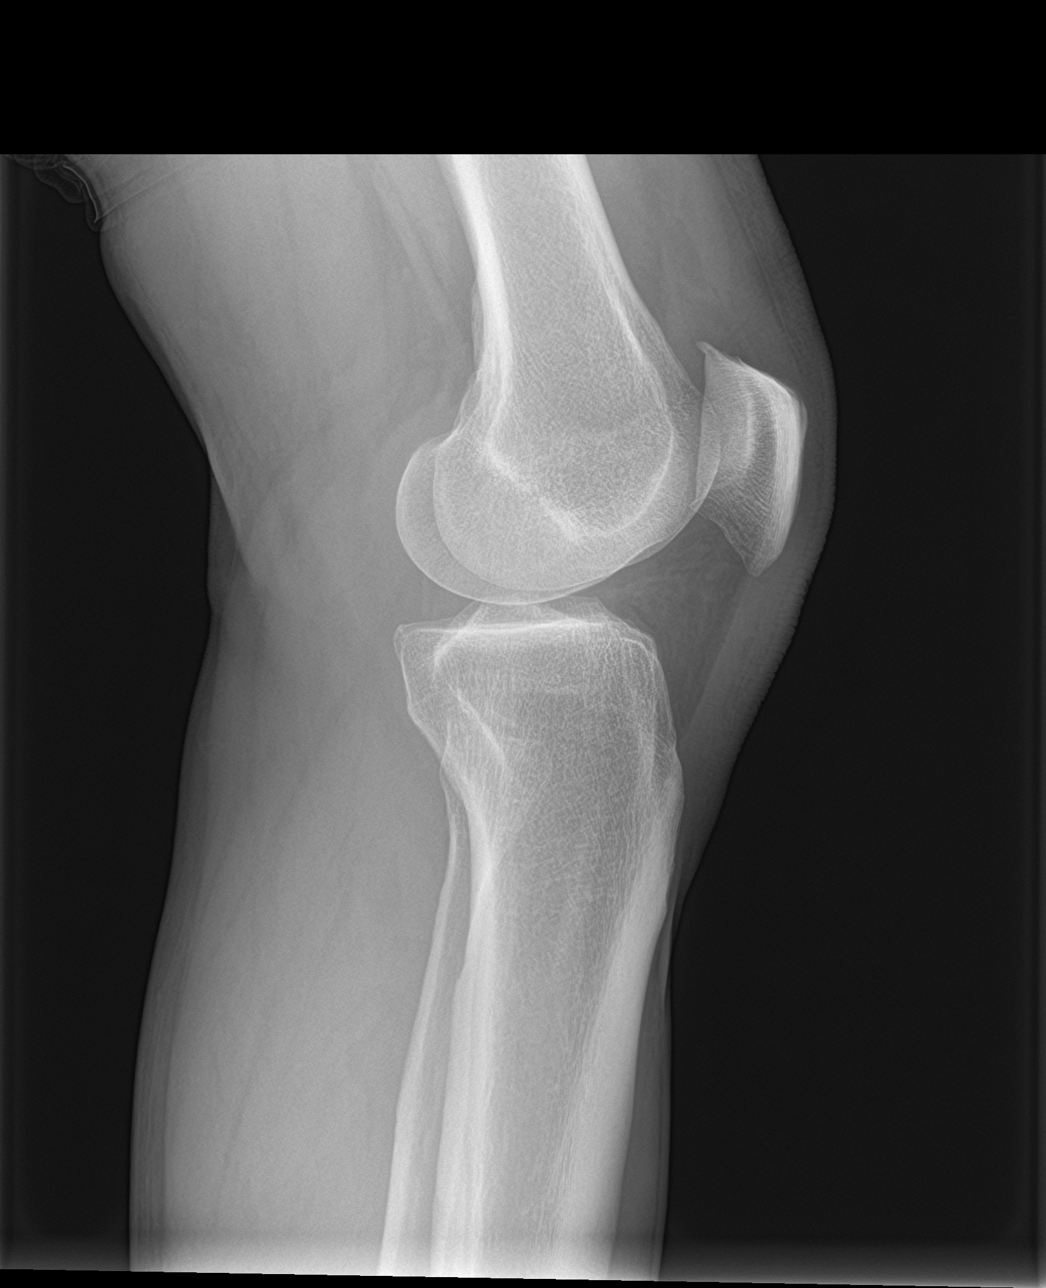

[knee ap bilat standing]
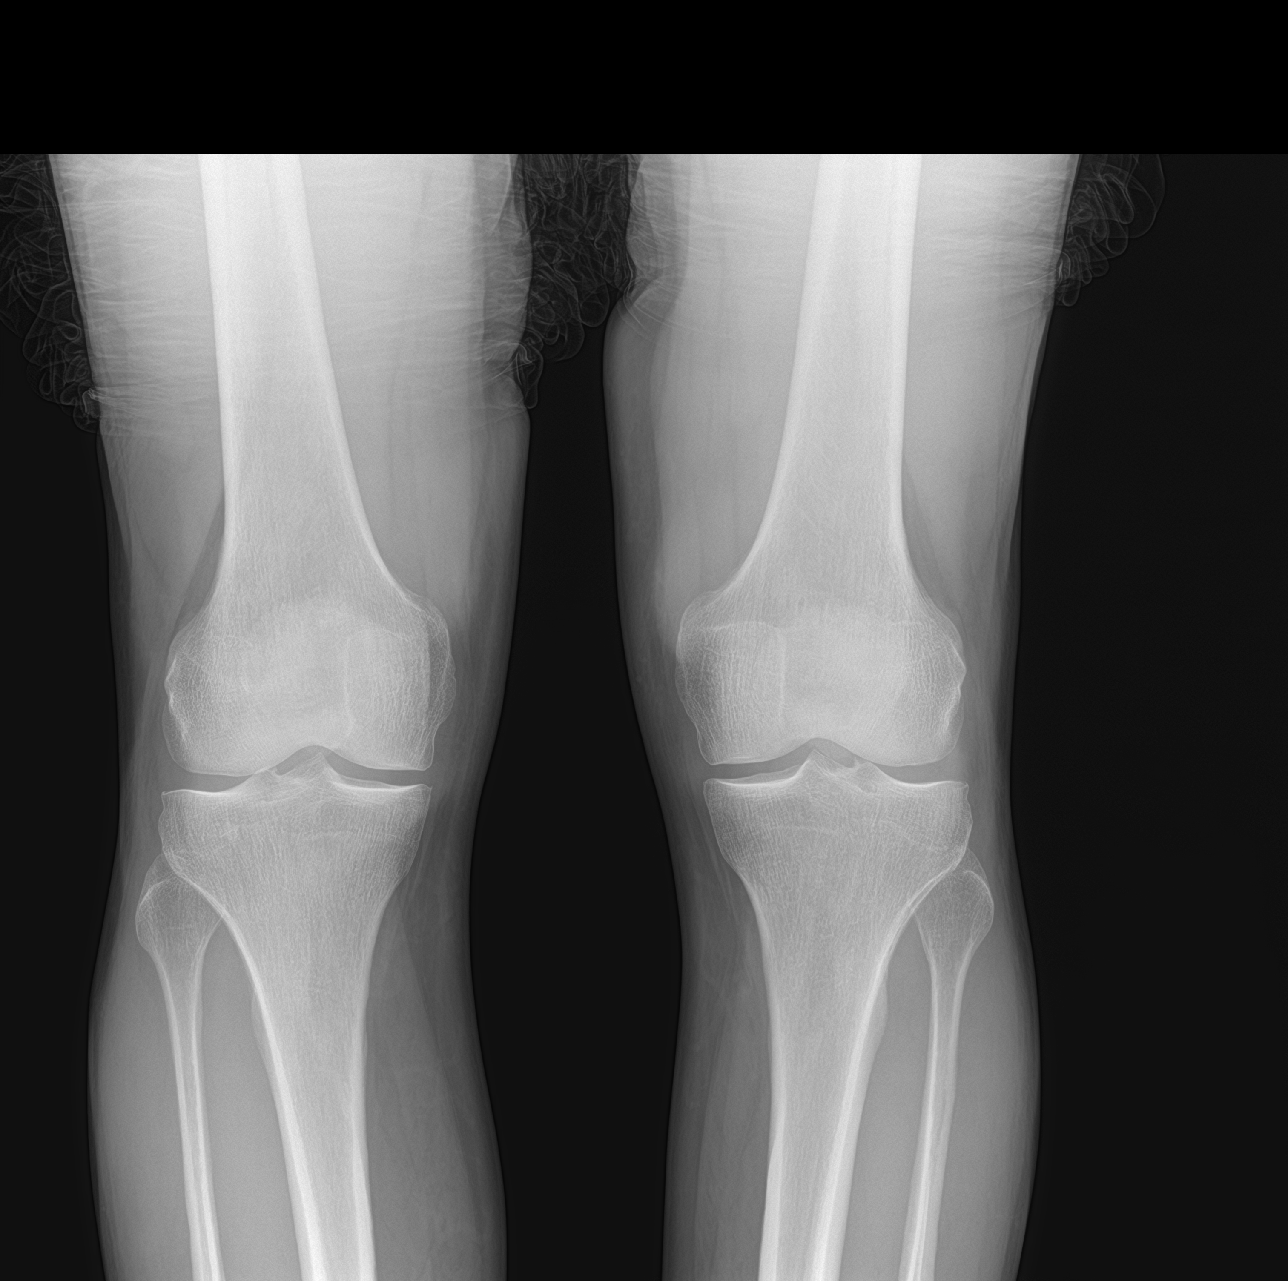

[knee sunrise standing (1 of 2)]
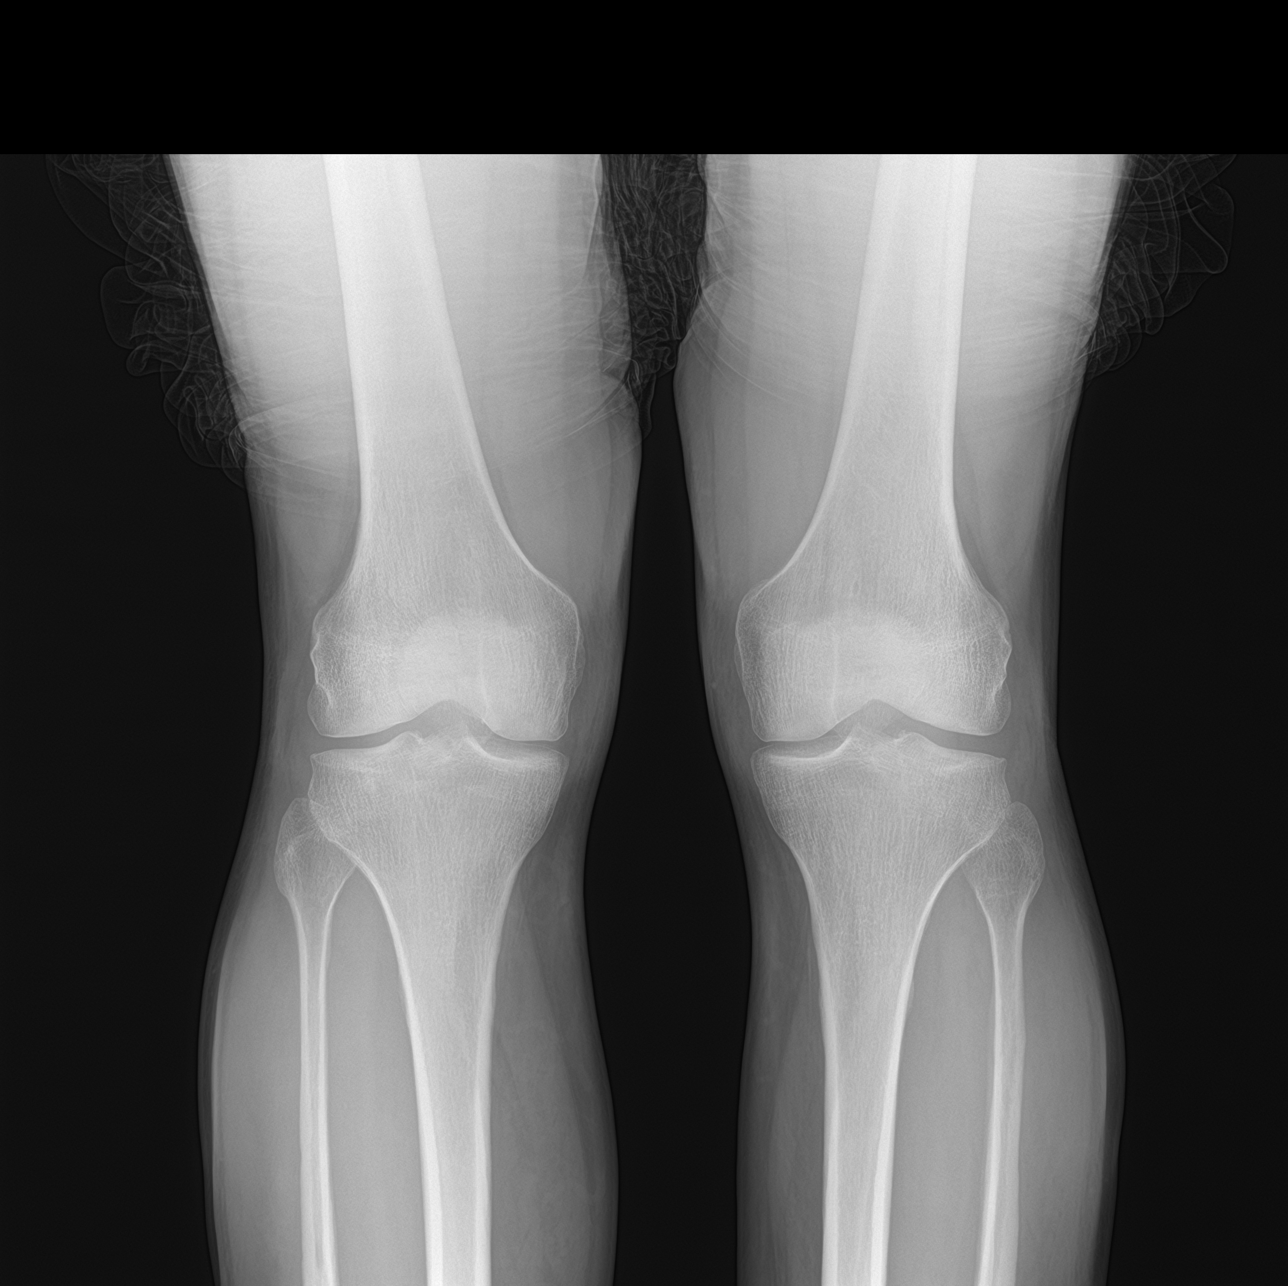

[knee sunrise standing (2 of 2)]
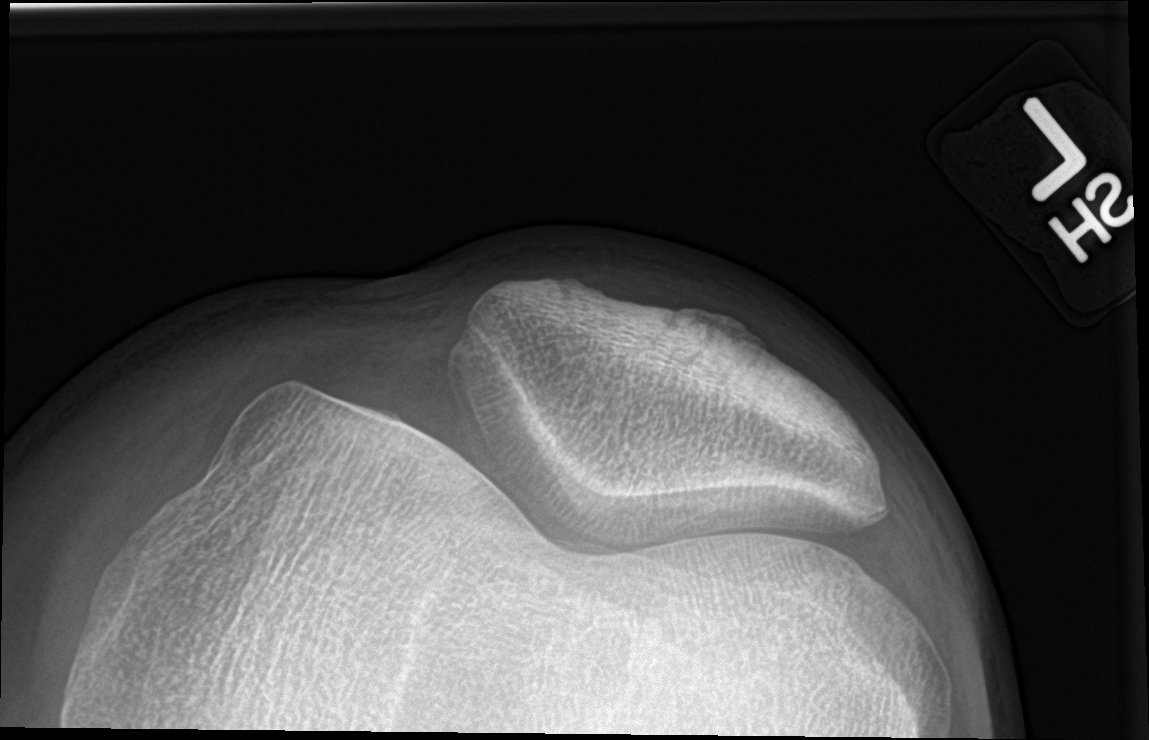

[4 of 4 positions shown; findings below may reference images not displayed]

FINDINGS: Mild patellofemoral degenerative changes are seen. No acute fracture
or dislocation is noted. No soft tissue abnormality is seen.
IMPRESSION: Mild degenerative change without acute abnormality.

## 2023-09-18 ENCOUNTER — Ambulatory Visit: Payer: BC Managed Care – PPO | Admitting: Sports Medicine

## 2023-09-18 ENCOUNTER — Ambulatory Visit: Payer: Self-pay | Admitting: Sports Medicine

## 2023-09-18 NOTE — Telephone Encounter (Signed)
  Chief Complaint: cough Symptoms: cough, stuff nose Frequency: about 4 weeks, worse 4-5 days ago.  Pertinent Negatives: Patient denies fever, hemoptysis, productive cough, smoking,   Disposition: [] ED /[] Urgent Care (no appt availability in office) / [x] Appointment(In office/virtual)/ []  [place] [facility]/ [] Home Care/ [] Refused Recommended Disposition /[] [name]/ []  Follow-up with PCP  Additional Notes: Pt states that it started about 4 weeks ago, was getting better but now it came back.  Pt states that he does cough stuff up very rarely. States he has on/off stuffy nose. Denies fever now and with initial onset of cough. Pt sched with PCP.   Copied from CRM [id number]. Topic: Clinical - Red Word Triage >> Sep 18, 2023  8:40 AM Dennison Nancy wrote: Red Word that prompted transfer to Nurse Triage: have a linger cough for about a week and worsen Reason for Disposition  Cough has been present for > 3 weeks  Answer Assessment - Initial Assessment Questions 1. ONSET: "When did the cough begin?"      Originally about 4 weeks ago, somewhat resolved and about 4-5 days ago became worse 2. SEVERITY: "How bad is the cough today?"      moderate 3. SPUTUM: "Describe the color of your sputum" (none, dry cough; clear, white, yellow, green)     Rarely coughs stuff up, but when does cough stuff up, yellow/clear 4. HEMOPTYSIS: "Are you coughing up any blood?" If so ask: "How much?" (flecks, streaks, tablespoons, etc.)     denies 5. DIFFICULTY BREATHING: "Are you having difficulty breathing?" If Yes, ask: "How bad is it?" (e.g., mild, moderate, severe)    - MILD: No SOB at rest, mild SOB with walking, speaks normally in sentences, can lie down, no retractions, pulse < 100.    - MODERATE: SOB at rest, SOB with minimal exertion and prefers to sit, cannot lie down flat, speaks in phrases, mild retractions, audible wheezing, pulse 100-120.    - SEVERE: Very SOB at rest, speaks in single words,  struggling to breathe, sitting hunched forward, retractions, pulse > 120      denies 6. FEVER: "Do you have a fever?" If Yes, ask: "What is your temperature, how was it measured, and when did it start?"     denies 7. CARDIAC HISTORY: "Do you have any history of heart disease?" (e.g., heart attack, congestive heart failure)      denies 8. LUNG HISTORY: "Do you have any history of lung disease?"  (e.g., pulmonary embolus, asthma, emphysema)     denies 9. PE RISK FACTORS: "Do you have a history of blood clots?" (or: recent major surgery, recent prolonged travel, bedridden)     deines 10. OTHER SYMPTOMS: "Do you have any other symptoms?" (e.g., runny nose, wheezing, chest pain)       Wheezing at the end of cough, stuffy nose, CP "deep in lungs" 12. TRAVEL: "Have you traveled out of the country in the last month?" (e.g., travel history, exposures)       denies  Protocols used: Cough - Acute Non-Productive-A-AH

## 2023-09-19 ENCOUNTER — Encounter: Payer: Self-pay | Admitting: Sports Medicine

## 2023-09-19 ENCOUNTER — Ambulatory Visit (INDEPENDENT_AMBULATORY_CARE_PROVIDER_SITE_OTHER): Payer: BC Managed Care – PPO | Admitting: Family Medicine

## 2023-09-19 VITALS — BP 128/78 | HR 84 | Temp 97.6°F | Ht 71.0 in | Wt 196.0 lb

## 2023-09-19 DIAGNOSIS — R6889 Other general symptoms and signs: Secondary | ICD-10-CM

## 2023-09-19 DIAGNOSIS — B9689 Other specified bacterial agents as the cause of diseases classified elsewhere: Secondary | ICD-10-CM | POA: Insufficient documentation

## 2023-09-19 DIAGNOSIS — J208 Acute bronchitis due to other specified organisms: Secondary | ICD-10-CM | POA: Diagnosis not present

## 2023-09-19 LAB — POCT INFLUENZA A/B
Influenza A, POC: NEGATIVE
Influenza B, POC: NEGATIVE

## 2023-09-19 LAB — POC COVID19 BINAXNOW: SARS Coronavirus 2 Ag: NEGATIVE

## 2023-09-19 MED ORDER — PREDNISONE 20 MG PO TABS: 20.0000 mg | ORAL_TABLET | Freq: Two times a day (BID) | ORAL | 0 refills | Status: AC

## 2023-09-19 MED ORDER — BENZONATATE 200 MG PO CAPS: 200.0000 mg | ORAL_CAPSULE | Freq: Two times a day (BID) | ORAL | 0 refills | Status: DC | PRN

## 2023-09-19 MED ORDER — GUAIFENESIN-CODEINE 100-10 MG/5ML PO SOLN
5.0000 mL | Freq: Four times a day (QID) | ORAL | 0 refills | Status: DC | PRN
Start: 2023-09-19 — End: 2023-10-01

## 2023-09-19 MED ORDER — AZITHROMYCIN 250 MG PO TABS: ORAL_TABLET | ORAL | 0 refills | Status: AC

## 2023-09-19 NOTE — Progress Notes (Signed)
Benjamin Leonard - 58 y.o. male MRN [id number]  Date of birth: 1966/06/15  Subjective Chief Complaint  Patient presents with   URI    HPI Benjamin Leonard is a 58 y.o. male here today with complaint of cough, congestion, body aches, fatigue and post nasal drainage.  His symptoms started a couple of days ago.  So far he has tried increased fluids and OTC medication without much relief.   He does have history of UC but is not on any significant immunosuppressant therapy. He has noted wheezing at times. Denies dyspnea, fever, or chills.   ROS:  A comprehensive ROS was completed and negative except as noted per HPI  Allergies  Allergen Reactions   Balsam Rash    Positive Patch Test 09/07/21   Cobalt Rash    Positive Patch Test 09/07/21   Quaternium-15 Rash    Positive Patch Test 09/07/21    Past Medical History:  Diagnosis Date   Borderline hypertension    Hearing loss    Right ear w/ hearing aid (new one in 2008)   Hyperlipidemia    OAB (overactive bladder)    or BPH?   Ulcerative colitis     Past Surgical History:  Procedure Laterality Date   BACK SURGERY     BICEPS TENDON REPAIR Right 2016   RHINOPLASTY  1997   right ear surgery for scar tissue removal  1996    Social History   Socioeconomic History   Marital status: Married    Spouse name: [name]   Number of children: 2   Years of education: Not on file   Highest education level: Bachelor's degree (e.g., BA, AB, BS)  Occupational History   Occupation: Teacher, early years/pre, Product/process development scientist, also works for [employer]  Tobacco Use   Smoking status: Never   Smokeless tobacco: Never  Vaping Use   Vaping status: Never Used  Substance and Sexual Activity   Alcohol use: Yes    Alcohol/week: 0.0 standard drinks of alcohol    Comment: rare    Drug use: No   Sexual activity: Not on file  Other Topics Concern   Not on file  Social History Narrative   Moved from [place] in 2008. Mom lives with them.   Regular exercise:  Works out 3 days a  week.   2 children   12 and 7   Daughter 46 is type I diabetic      Mother has platelet disorder (thrombocytosis)      Maternal grand parents - DM II   Social Drivers of Corporate investment banker Strain: Low Risk  (09/19/2023)   Overall Financial Resource Strain (CARDIA)    Difficulty of Paying Living Expenses: Not hard at all  Food Insecurity: No Food Insecurity (09/19/2023)   Hunger Vital Sign    Worried About Running Out of Food in the Last Year: Never true    Ran Out of Food in the Last Year: Never true  Transportation Needs: No Transportation Needs (09/19/2023)   PRAPARE - Administrator, Civil Service (Medical): No    Lack of Transportation (Non-Medical): No  Physical Activity: Insufficiently Active (09/19/2023)   Exercise Vital Sign    Days of Exercise per Week: 3 days    Minutes of Exercise per Session: 20 min  Stress: No Stress Concern Present (09/19/2023)   [employer] of Occupational Health - Occupational Stress Questionnaire    Feeling of Stress : Not at all  Social Connections: Moderately  Isolated (09/19/2023)   Social Connection and Isolation Panel [NHANES]    Frequency of Communication with Friends and Family: More than three times a week    Frequency of Social Gatherings with Friends and Family: Once a week    Attends Religious Services: Never    Database administrator or Organizations: No    Attends Engineer, structural: Not on file    Marital Status: Married    Family History  Problem Relation Age of Onset   Hyperlipidemia Mother    Hypertension Mother    Hypertension Father    Sudden death Father    Hypertension Brother    Diabetes Brother    Arrhythmia Brother    Sudden death Maternal Grandmother    Diabetes Maternal Grandmother    Diabetes Maternal Grandfather    Gout Brother    Arthritis Brother    Healthy Son    Diabetes Daughter    Healthy Daughter    Heart attack Neg Hx     Health Maintenance  Topic Date Due    COVID-19 Vaccine (6 - 2024-25 season) 03/31/2023   Colonoscopy  05/05/2027   DTaP/Tdap/Td (5 - Td or Tdap) 02/08/2029   Hepatitis C Screening  Completed   HIV Screening  Completed   Zoster Vaccines- Shingrix  Completed   HPV VACCINES  Aged Out   Pneumococcal Vaccine 73-17 Years old  Discontinued   INFLUENZA VACCINE  Discontinued     ----------------------------------------------------------------------------------------------------------------------------------------------------------------------------------------------------------------- Physical Exam BP 128/78 (BP Location: Left Arm, Patient Position: Sitting, Cuff Size: Normal)   Pulse 84   Temp 97.6 F (36.4 C) (Oral)   Ht 5\' 11"  (1.803 m)   Wt 196 lb (88.9 kg)   SpO2 99%   BMI 27.34 kg/m   Physical Exam Constitutional:      Appearance: Normal appearance.  HENT:     Head: Normocephalic and atraumatic.     Right Ear: Tympanic membrane normal.     Left Ear: Tympanic membrane normal.  Eyes:     General: No scleral icterus. Cardiovascular:     Rate and Rhythm: Normal rate and regular rhythm.  Pulmonary:     Effort: Pulmonary effort is normal.     Breath sounds: Wheezing (mild expiratory) present.  Neurological:     Mental Status: He is alert.  Psychiatric:        Mood and Affect: Mood normal.        Behavior: Behavior normal.     ------------------------------------------------------------------------------------------------------------------------------------------------------------------------------------------------------------------- Assessment and Plan  Acute bacterial bronchitis Negative flu and COVID.  Recommend continued supportive care. He has had episodes of pneumonia in the past.  Will go ahead and treat aggressively with azithromycin and prednisone burst.  Robitussin AC and tessalon as needed for cough.  Red flags reviewed.  Contact clinic if symptoms are worsening.    Meds ordered this encounter   Medications   azithromycin (ZITHROMAX) 250 MG tablet    Sig: Take 2 tablets on day 1, then 1 tablet daily on days 2 through 5    Dispense:  6 tablet    Refill:  0   predniSONE (DELTASONE) 20 MG tablet    Sig: Take 1 tablet (20 mg total) by mouth 2 (two) times daily with a meal for 5 days.    Dispense:  10 tablet    Refill:  0   benzonatate (TESSALON) 200 MG capsule    Sig: Take 1 capsule (200 mg total) by mouth 2 (two) times daily as needed for  cough.    Dispense:  20 capsule    Refill:  0   guaiFENesin-codeine 100-10 MG/5ML syrup    Sig: Take 5 mLs by mouth every 6 (six) hours as needed for cough.    Dispense:  120 mL    Refill:  0    No follow-ups on file.    This visit occurred during the SARS-CoV-2 public health emergency.  Safety protocols were in place, including screening questions prior to the visit, additional usage of staff PPE, and extensive cleaning of exam room while observing appropriate contact time as indicated for disinfecting solutions.

## 2023-09-19 NOTE — Assessment & Plan Note (Signed)
Negative flu and COVID.  Recommend continued supportive care. He has had episodes of pneumonia in the past.  Will go ahead and treat aggressively with azithromycin and prednisone burst.  Robitussin AC and tessalon as needed for cough.  Red flags reviewed.  Contact clinic if symptoms are worsening.

## 2023-09-21 NOTE — Telephone Encounter (Signed)
 Hello all, please make sure that he does not get any no-show fees for this since it was a scheduling issue.

## 2023-09-23 ENCOUNTER — Encounter: Payer: Self-pay | Admitting: Family Medicine

## 2023-09-24 ENCOUNTER — Other Ambulatory Visit: Payer: Self-pay | Admitting: Family Medicine

## 2023-09-24 MED ORDER — CEFDINIR 300 MG PO CAPS: 300.0000 mg | ORAL_CAPSULE | Freq: Two times a day (BID) | ORAL | 0 refills | Status: DC

## 2023-09-25 ENCOUNTER — Other Ambulatory Visit: Payer: Self-pay | Admitting: Family Medicine

## 2023-09-25 MED ORDER — METHYLPREDNISOLONE 4 MG PO TBPK: ORAL_TABLET | ORAL | 0 refills | Status: DC

## 2023-09-27 NOTE — Telephone Encounter (Signed)
 FYI regarding more problems with E2C2!

## 2023-10-01 ENCOUNTER — Other Ambulatory Visit: Payer: Self-pay | Admitting: Sports Medicine

## 2023-10-01 ENCOUNTER — Ambulatory Visit

## 2023-10-01 ENCOUNTER — Ambulatory Visit: Payer: BC Managed Care – PPO | Admitting: Sports Medicine

## 2023-10-01 DIAGNOSIS — R058 Other specified cough: Secondary | ICD-10-CM

## 2023-10-01 DIAGNOSIS — M5412 Radiculopathy, cervical region: Secondary | ICD-10-CM | POA: Diagnosis not present

## 2023-10-01 DIAGNOSIS — R053 Chronic cough: Secondary | ICD-10-CM

## 2023-10-01 MED ORDER — HYDROCOD POLI-CHLORPHE POLI ER 10-8 MG/5ML PO SUER
5.0000 mL | Freq: Two times a day (BID) | ORAL | 0 refills | Status: DC | PRN
Start: 2023-10-01 — End: 2023-10-18

## 2023-10-01 MED ORDER — BUDESONIDE-FORMOTEROL FUMARATE 160-4.5 MCG/ACT IN AERO
1.0000 | INHALATION_SPRAY | Freq: Two times a day (BID) | RESPIRATORY_TRACT | 11 refills | Status: DC
Start: 2023-10-01 — End: 2024-01-02

## 2023-10-01 NOTE — Assessment & Plan Note (Signed)
 Also working with a Land, he does have known cervical DDD, he has seen a couple of neurosurgeons, he has had some epidurals. He has an MRI coming up. He will proceed with this, I would also like him to do some home conditioning, if he has discomfort I would entirely suggest 3 stacked cervical epidurals before considering operative intervention.

## 2023-10-01 NOTE — Progress Notes (Signed)
    Procedures performed today:    None.  Independent interpretation of notes and tests performed by another provider:   None.  Brief History, Exam, Impression, and Recommendations:    Post-viral cough syndrome Pleasant 58 year old male, he is on immunosuppression for ulcerative colitis, back in January he started to have a cough, achiness, malaise. COVID, flu negative. He was treated with antibiotics and steroids by Dr. Ashley Royalty. Improved but unfortunately continues to have a cough, minimally productive. Overall symptoms have improved from the initial infection. On exam his oropharynx is normal, ear canals normal, neck exam normal, lungs are clear. Suspect postviral cough syndrome. We will treat this symptomatically, I would however like a chest x-ray and I would like him to do a low-dose Symbicort once or twice a day.  Radiculopathy, cervical region Also working with a chiropractor, he does have known cervical DDD, he has seen a couple of neurosurgeons, he has had some epidurals. He has an MRI coming up. He will proceed with this, I would also like him to do some home conditioning, if he has discomfort I would entirely suggest 3 stacked cervical epidurals before considering operative intervention.    ____________________________________________ Ihor Austin. Benjamin Stain, M.D., ABFM., CAQSM., AME. Primary Care and Sports Medicine [facility]  Adjunct Professor of Family Medicine  [place] [employer] [facility] [employer]  Restaurant manager, fast food

## 2023-10-01 NOTE — Assessment & Plan Note (Signed)
 Pleasant 58 year old male, he is on immunosuppression for ulcerative colitis, back in January he started to have a cough, achiness, malaise. COVID, flu negative. He was treated with antibiotics and steroids by Dr. Ashley Royalty. Improved but unfortunately continues to have a cough, minimally productive. Overall symptoms have improved from the initial infection. On exam his oropharynx is normal, ear canals normal, neck exam normal, lungs are clear. Suspect postviral cough syndrome. We will treat this symptomatically, I would however like a chest x-ray and I would like him to do a low-dose Symbicort once or twice a day.

## 2023-10-02 ENCOUNTER — Encounter: Payer: Self-pay | Admitting: Sports Medicine

## 2023-10-02 NOTE — Telephone Encounter (Signed)
 Okay I really do not know the alternatives that would be preferred by insurance company, please find out what the preferred ICS/LABA is for him.

## 2023-10-07 ENCOUNTER — Encounter (INDEPENDENT_AMBULATORY_CARE_PROVIDER_SITE_OTHER): Payer: Self-pay | Admitting: Sports Medicine

## 2023-10-07 DIAGNOSIS — J208 Acute bronchitis due to other specified organisms: Secondary | ICD-10-CM | POA: Diagnosis not present

## 2023-10-07 DIAGNOSIS — B9689 Other specified bacterial agents as the cause of diseases classified elsewhere: Secondary | ICD-10-CM | POA: Diagnosis not present

## 2023-10-07 NOTE — Telephone Encounter (Signed)

## 2023-10-11 ENCOUNTER — Ambulatory Visit: Admitting: Physician Assistant

## 2023-10-11 ENCOUNTER — Encounter: Payer: Self-pay | Admitting: Physician Assistant

## 2023-10-11 VITALS — BP 139/93 | HR 81 | Temp 98.2°F | Ht 71.0 in | Wt 188.0 lb

## 2023-10-11 DIAGNOSIS — J329 Chronic sinusitis, unspecified: Secondary | ICD-10-CM | POA: Diagnosis not present

## 2023-10-11 DIAGNOSIS — R5381 Other malaise: Secondary | ICD-10-CM | POA: Diagnosis not present

## 2023-10-11 DIAGNOSIS — J40 Bronchitis, not specified as acute or chronic: Secondary | ICD-10-CM

## 2023-10-11 DIAGNOSIS — R053 Chronic cough: Secondary | ICD-10-CM | POA: Diagnosis not present

## 2023-10-11 DIAGNOSIS — R058 Other specified cough: Secondary | ICD-10-CM

## 2023-10-11 DIAGNOSIS — J029 Acute pharyngitis, unspecified: Secondary | ICD-10-CM

## 2023-10-11 DIAGNOSIS — R5383 Other fatigue: Secondary | ICD-10-CM | POA: Diagnosis not present

## 2023-10-11 NOTE — Progress Notes (Signed)
 Established Patient Office Visit  Subjective   Patient ID: Benjamin Leonard, male    DOB: 12-Feb-1966  Age: 58 y.o. MRN: [id number]  Chief Complaint  Patient presents with   Cough    X 1.5 months intermittently     HPI Pt is a 58 yo male who has history of pneumonia in the past who has not felt well since the end of January with cough, fatigue, chest congestion. He was seen on 09/19/2023 and given zpack and prednisone. Which he finished. He did feel better but still had cough. He was sent omnicef but never took it.  He was seen on 3/4 and was given tussionex and symbicort to start. CXR was clear.   He continues to not feel better. He has cough, sore throat, headache, malaise and fatigue. He denies any fever. He is easily winded but no shortness of breath. No swelling. Cough is worse at night and not productive.   .. Active Ambulatory Problems    Diagnosis Date Noted   Ulcerative colitis (HCC) 05/27/2007   Perennial allergic rhinitis 10/19/2010   Premature atrial contractions 01/17/2011   Plantar fasciitis, left 03/15/2011   Annual physical exam 03/08/2012   Right distal Biceps tendon tear 12/04/2012   Obstructive uropathy 12/08/2013   Hemangioma of liver 08/06/2014   Hiatal hernia 09/03/2014   Lumbar spinal stenosis 09/30/2014   Left Achilles tendinitis 01/18/2015   Anxiety and depression 02/15/2015   Hyperlipidemia 01/10/2016   Degenerative disc disease, cervical 01/10/2016   Right patellofemoral syndrome 04/12/2016   Acute superficial venous thrombosis of right lower extremity 12/06/2017   Primary osteoarthritis of both hands 02/09/2019   Allergic conjunctivitis 02/09/2019   Bilateral hand swelling 02/23/2019   Left knee injury 12/09/2020   Rotator cuff tear, right 08/11/2021   Patellofemoral syndrome, left 08/11/2021   Arthritis of first metatarsophalangeal (MTP) joint of right foot 05/31/2022   Sore throat 12/21/2022   Polyarthralgia 12/21/2022   Ganglion cyst of  flexor pollicis longus tendon sheath of left hand 01/11/2023   Blepharitis 02/27/2021   Lower urinary tract symptoms (LUTS) 10/22/2019   Osteoarthritis 02/27/2021   Radiculopathy, cervical region 04/06/2021   Left-sided chest wall pain 05/16/2023   Acute bacterial bronchitis 09/19/2023   Post-viral cough syndrome 10/01/2023   Sinobronchitis 10/11/2023   No energy 10/11/2023   Persistent cough for 3 weeks or longer 10/11/2023   Resolved Ambulatory Problems    Diagnosis Date Noted   SINUSITIS- ACUTE-NOS 09/01/2007   Acute upper respiratory infection 05/27/2007   DERMATITIS, CONTACT, NOS 05/27/2007   COSTOCHONDRITIS, LEFT 09/21/2008   Shortness of breath 02/23/2009   Sprain of wrist 12/30/2009   Preventative health care 10/19/2010   Fatigue 10/19/2010   Knee pain, bilateral 10/19/2010   Bradycardia by electrocardiogram 12/20/2010   Bilateral gastrocnemius strain 02/05/2011   Left elbow pain 05/23/2011   CALF PAIN 02/01/2011   Right hand pain 11/08/2011   Right ankle pain 02/14/2012   Concussion with no loss of consciousness 12/08/2013   Exertional chest pain 06/22/2014   Folliculitis 06/22/2014   Pneumonia 08/04/2014   Pleural effusion 08/04/2014   Elevated blood pressure 08/18/2014   Strain of right gastrocnemius muscle 04/15/2015   Cough 08/05/2015   Onychomycosis 08/05/2015   Strain of calf muscle 08/29/2015   Chronic foot pain, right 12/27/2017   Folliculitis 03/10/2018   Swelling of left wrist 08/13/2019   Blepharitis of both eyes 08/13/2019   Dry eye syndrome 08/13/2019   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   Dry eye 04/07/2019   Nuclear sclerotic cataract of both eyes 04/07/2019   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   Uncomplicated degenerative myopia of both eyes 04/07/2019   Acute pelvic pain 10/14/2019   Acute bronchitis 04/13/2020   COVID-19 12/23/2020   Past Medical History:  Diagnosis Date   Borderline hypertension    Hearing loss    OAB (overactive  bladder)    Ulcerative colitis      ROS    Objective:     BP (!) 144/98 (BP Location: Left Arm, Patient Position: Sitting, Cuff Size: Large)   Pulse 81   Ht 5\' 11"  (1.803 m)   Wt 188 lb (85.3 kg)   SpO2 96%   BMI 26.22 kg/m  BP Readings from Last 3 Encounters:  10/11/23 (!) 139/93  09/19/23 128/78  05/16/23 126/77   Wt Readings from Last 3 Encounters:  10/11/23 188 lb (85.3 kg)  09/19/23 196 lb (88.9 kg)  05/16/23 193 lb (87.5 kg)      Physical Exam Constitutional:      Appearance: Normal appearance.  HENT:     Head: Normocephalic.     Right Ear: Tympanic membrane, ear canal and external ear normal.     Left Ear: Tympanic membrane, ear canal and external ear normal.     Nose: Nose normal.     Mouth/Throat:     Mouth: Mucous membranes are moist.     Pharynx: Posterior oropharyngeal erythema present. No oropharyngeal exudate.  Eyes:     Extraocular Movements: Extraocular movements intact.     Conjunctiva/sclera: Conjunctivae normal.     Pupils: Pupils are equal, round, and reactive to light.  Cardiovascular:     Rate and Rhythm: Normal rate and regular rhythm.  Pulmonary:     Effort: Pulmonary effort is normal.     Breath sounds: Normal breath sounds. No wheezing or rhonchi.  Musculoskeletal:     Cervical back: Normal range of motion and neck supple. No tenderness.     Right lower leg: No edema.     Left lower leg: No edema.  Lymphadenopathy:     Cervical: No cervical adenopathy.  Neurological:     General: No focal deficit present.     Mental Status: He is alert.  Psychiatric:        Mood and Affect: Mood normal.        The 10-year ASCVD risk score (Arnett DK, et al., 2019) is: 10.9%    Assessment & Plan:  Marland KitchenMarland KitchenMarcellino was seen today for cough.  Diagnoses and all orders for this visit:  Persistent cough for 3 weeks or longer -     CT Chest W Contrast; Future -     CBC w/Diff/Platelet -     Mononucleosis Test, Qual W/ Reflex -      CMP14+EGFR -     C-reactive protein -     Sed Rate (ESR) -     Blood culture (routine single) -     Procalcitonin -     TSH  No energy -     CT Chest W Contrast; Future -     CBC w/Diff/Platelet -     Mononucleosis Test, Qual W/ Reflex -     CMP14+EGFR -     C-reactive protein -     Sed Rate (ESR) -     Blood culture (routine single) -     Procalcitonin -     TSH  Sinobronchitis -  CBC w/Diff/Platelet -     Mononucleosis Test, Qual W/ Reflex -     CMP14+EGFR -     C-reactive protein -     Sed Rate (ESR) -     Blood culture (routine single) -     Procalcitonin -     TSH  Malaise and fatigue -     CBC w/Diff/Platelet -     Mononucleosis Test, Qual W/ Reflex -     CMP14+EGFR -     C-reactive protein -     Sed Rate (ESR) -     Blood culture (routine single) -     Procalcitonin -     TSH  Sore throat -     CBC w/Diff/Platelet -     Mononucleosis Test, Qual W/ Reflex -     CMP14+EGFR -     C-reactive protein -     Sed Rate (ESR) -     Blood culture (routine single) -     Procalcitonin -     TSH   Overall vitals and PE are stable without red flags Will order CT of chest Will get labs for reassurance of no systemic infection Start omnicef that is at home Continue tussinex for cough at night Continue symbicort Rest and hydrate Follow up if symptoms worsen or persist    Tandy Gaw, PA-C

## 2023-10-11 NOTE — Patient Instructions (Addendum)
 Start omnicef Continue symbicort for now  Will get labs and CT of chest   Let us know how you are doing on monday

## 2023-10-12 LAB — CMP14+EGFR
ALT: 43 IU/L (ref 0–44)
AST: 32 IU/L (ref 0–40)
Albumin: 4.4 g/dL (ref 3.8–4.9)
Alkaline Phosphatase: 111 IU/L (ref 44–121)
BUN/Creatinine Ratio: 14 (ref 9–20)
BUN: 13 mg/dL (ref 6–24)
Bilirubin Total: 0.4 mg/dL (ref 0.0–1.2)
CO2: 23 mmol/L (ref 20–29)
Calcium: 9.5 mg/dL (ref 8.7–10.2)
Chloride: 98 mmol/L (ref 96–106)
Creatinine, Ser: 0.9 mg/dL (ref 0.76–1.27)
Globulin, Total: 2.4 g/dL (ref 1.5–4.5)
Glucose: 84 mg/dL (ref 70–99)
Potassium: 4.9 mmol/L (ref 3.5–5.2)
Sodium: 139 mmol/L (ref 134–144)
Total Protein: 6.8 g/dL (ref 6.0–8.5)
eGFR: 98 mL/min/{1.73_m2} (ref >59)

## 2023-10-12 LAB — CBC WITH DIFFERENTIAL/PLATELET
Basophils Absolute: 0 10*3/uL (ref 0.0–0.2)
Basos: 1 %
EOS (ABSOLUTE): 0.2 10*3/uL (ref 0.0–0.4)
Eos: 2 %
Hematocrit: 45.1 % (ref 37.5–51.0)
Hemoglobin: 14.9 g/dL (ref 13.0–17.7)
Immature Grans (Abs): 0.1 10*3/uL (ref 0.0–0.1)
Immature Granulocytes: 1 %
Lymphocytes Absolute: 1.1 10*3/uL (ref 0.7–3.1)
Lymphs: 12 %
MCH: 30.2 pg (ref 26.6–33.0)
MCHC: 33 g/dL (ref 31.5–35.7)
MCV: 92 fL (ref 79–97)
Monocytes Absolute: 0.7 10*3/uL (ref 0.1–0.9)
Monocytes: 8 %
Neutrophils Absolute: 6.6 10*3/uL (ref 1.4–7.0)
Neutrophils: 76 %
Platelets: 299 10*3/uL (ref 150–450)
RBC: 4.93 x10E6/uL (ref 4.14–5.80)
RDW: 12.1 % (ref 11.6–15.4)
WBC: 8.7 10*3/uL (ref 3.4–10.8)

## 2023-10-12 LAB — PROCALCITONIN: Procalcitonin: 0.07 ng/mL (ref 0.00–0.08)

## 2023-10-12 LAB — MONO QUAL W/RFLX QN: Mono Qual W/Rflx Qn: NEGATIVE

## 2023-10-12 LAB — C-REACTIVE PROTEIN: CRP: 27 mg/L — ABNORMAL HIGH (ref 0–10)

## 2023-10-12 LAB — TSH: TSH: 1.57 u[IU]/mL (ref 0.450–4.500)

## 2023-10-12 LAB — SEDIMENTATION RATE: Sed Rate: 20 mm/h (ref 0–30)

## 2023-10-13 ENCOUNTER — Encounter: Payer: Self-pay | Admitting: Physician Assistant

## 2023-10-13 NOTE — Progress Notes (Signed)
 Benjamin Leonard,   No sign of any systemic infection. WBC normal. No bacteria growth in blood cultures. Normal pro-calcitonin.   Kidney and liver look good.   Electrolytes look good.   Thyroid looks good.   Mono screen negative.   CRP for inflammation is elevated. Appears your symptoms are more post viral inflammation.   When is CT scheduled for?

## 2023-10-14 ENCOUNTER — Encounter: Payer: Self-pay | Admitting: Physician Assistant

## 2023-10-14 ENCOUNTER — Ambulatory Visit: Admitting: Sports Medicine

## 2023-10-14 NOTE — Progress Notes (Signed)
 Ok finish the cefdinir and if not improving you 100 percent need to get that CT.

## 2023-10-17 LAB — CULTURE, BLOOD (SINGLE)

## 2023-10-18 ENCOUNTER — Other Ambulatory Visit: Payer: Self-pay | Admitting: Physician Assistant

## 2023-10-18 DIAGNOSIS — R058 Other specified cough: Secondary | ICD-10-CM

## 2023-10-18 MED ORDER — HYDROCOD POLI-CHLORPHE POLI ER 10-8 MG/5ML PO SUER: 5.0000 mL | Freq: Two times a day (BID) | ORAL | 0 refills | Status: DC | PRN

## 2023-10-18 NOTE — Progress Notes (Signed)
 Yes it is really weird. Your lab work is looking good. Blood cultures not growing anything. I did sent syrup. I will be on look out for chest CT.

## 2023-10-21 ENCOUNTER — Encounter: Payer: Self-pay | Admitting: Physician Assistant

## 2023-10-30 ENCOUNTER — Ambulatory Visit: Admitting: Sports Medicine

## 2023-10-30 ENCOUNTER — Encounter (INDEPENDENT_AMBULATORY_CARE_PROVIDER_SITE_OTHER): Payer: Self-pay | Admitting: Sports Medicine

## 2023-10-30 ENCOUNTER — Encounter: Payer: Self-pay | Admitting: Sports Medicine

## 2023-10-30 VITALS — BP 103/69 | HR 85 | Resp 20 | Ht 71.0 in | Wt 185.2 lb

## 2023-10-30 DIAGNOSIS — B9689 Other specified bacterial agents as the cause of diseases classified elsewhere: Secondary | ICD-10-CM

## 2023-10-30 DIAGNOSIS — R053 Chronic cough: Secondary | ICD-10-CM | POA: Diagnosis not present

## 2023-10-30 DIAGNOSIS — J208 Acute bronchitis due to other specified organisms: Secondary | ICD-10-CM

## 2023-10-30 NOTE — Progress Notes (Signed)
    Procedures performed today:    None.  Independent interpretation of notes and tests performed by another provider:   None.  Brief History, Exam, Impression, and Recommendations:    Chronic cough This is a very pleasant 58 year old male, he does have a history of ulcerative colitis, back in January he started to have a cough, achiness, malaise, COVID and flu testing was negative, he was treated with antibiotics and steroids, he improved but still had a cough, since then he has had several episodes of waxing and waning cough, minimally productive, historically with steroids and antibiotics and symptoms will improve temporarily. More recently he saw one of my partners, he had a fairly extensive workup for rheumatic disease, CRP was elevated, everything else was essentially normal. A CT was also obtained of the chest, bilateral scattered pulmonary nodules were noted the largest of which was 19 mm. He did see a rheumatologist recently that had suggested there is no currently treatable rheumatic disease. We did clarify with the patient if he has seen a pulmonologist regarding his nodules. Though these may simply be inflammatory nodules from his prior infection, it may also represent extraintestinal manifestations of his ulcerative colitis. Currently symptoms are under control so we will watch this for now though if they recur we certainly need pulmonology involved and I would certainly recommend at least a 12-day steroid taper. We filled out some disability paperwork today. Return to see me as needed.  I spent 30 minutes of total time managing this patient today, this includes chart review, face to face, and non-face to face time.  ____________________________________________ Ihor Austin. Benjamin Stain, M.D., ABFM., CAQSM., AME. Primary Care and Sports Medicine [facility]  Adjunct Professor of Family Medicine  [employer]  [place]

## 2023-10-30 NOTE — Assessment & Plan Note (Signed)
 This is a very pleasant 58 year old male, he does have a history of ulcerative colitis, back in January he started to have a cough, achiness, malaise, COVID and flu testing was negative, he was treated with antibiotics and steroids, he improved but still had a cough, since then he has had several episodes of waxing and waning cough, minimally productive, historically with steroids and antibiotics and symptoms will improve temporarily. More recently he saw one of my partners, he had a fairly extensive workup for rheumatic disease, CRP was elevated, everything else was essentially normal. A CT was also obtained of the chest, bilateral scattered pulmonary nodules were noted the largest of which was 19 mm. He did see a rheumatologist recently that had suggested there is no currently treatable rheumatic disease. We did clarify with the patient if he has seen a pulmonologist regarding his nodules. Though these may simply be inflammatory nodules from his prior infection, it may also represent extraintestinal manifestations of his ulcerative colitis. Currently symptoms are under control so we will watch this for now though if they recur we certainly need pulmonology involved and I would certainly recommend at least a 12-day steroid taper. We filled out some disability paperwork today. Return to see me as needed.

## 2023-11-06 ENCOUNTER — Encounter: Payer: Self-pay | Admitting: Physician Assistant

## 2023-11-06 ENCOUNTER — Ambulatory Visit: Admitting: Physician Assistant

## 2023-11-06 VITALS — BP 117/74 | HR 77 | Temp 97.8°F | Ht 71.0 in | Wt 186.0 lb

## 2023-11-06 DIAGNOSIS — R6889 Other general symptoms and signs: Secondary | ICD-10-CM

## 2023-11-06 DIAGNOSIS — J069 Acute upper respiratory infection, unspecified: Secondary | ICD-10-CM

## 2023-11-06 DIAGNOSIS — J3489 Other specified disorders of nose and nasal sinuses: Secondary | ICD-10-CM | POA: Diagnosis not present

## 2023-11-06 DIAGNOSIS — R918 Other nonspecific abnormal finding of lung field: Secondary | ICD-10-CM

## 2023-11-06 DIAGNOSIS — R7982 Elevated C-reactive protein (CRP): Secondary | ICD-10-CM

## 2023-11-06 DIAGNOSIS — M2550 Pain in unspecified joint: Secondary | ICD-10-CM

## 2023-11-06 LAB — POC COVID19 BINAXNOW: SARS Coronavirus 2 Ag: NEGATIVE

## 2023-11-06 LAB — POCT INFLUENZA A/B
Influenza A, POC: NEGATIVE
Influenza B, POC: NEGATIVE

## 2023-11-06 LAB — POCT RAPID STREP A (OFFICE): Rapid Strep A Screen: NEGATIVE

## 2023-11-06 MED ORDER — PREDNISONE 10 MG (21) PO TBPK
ORAL_TABLET | ORAL | 0 refills | Status: DC
Start: 2023-11-06 — End: 2023-11-07

## 2023-11-06 MED ORDER — CLARITHROMYCIN 500 MG PO TABS: 500.0000 mg | ORAL_TABLET | Freq: Two times a day (BID) | ORAL | 0 refills | Status: DC

## 2023-11-06 MED ORDER — FLUTICASONE PROPIONATE 50 MCG/ACT NA SUSP
2.0000 | Freq: Every day | NASAL | 0 refills | Status: DC
Start: 2023-11-06 — End: 2023-11-28

## 2023-11-06 MED ORDER — AMOXICILLIN-POT CLAVULANATE 875-125 MG PO TABS
1.0000 | ORAL_TABLET | Freq: Two times a day (BID) | ORAL | 0 refills | Status: DC
Start: 2023-11-06 — End: 2023-12-16

## 2023-11-06 NOTE — Progress Notes (Unsigned)
 Established Patient Office Visit  Subjective   Patient ID: Benjamin Leonard, male    DOB: 1966-04-05  Age: 58 y.o. MRN: [id number]  Chief Complaint  Patient presents with   Cough    Cough since january    HPI Pt is a 58 yo male who presents to the clinic with recurrent illness and not feeling good in waxing and waning situations since February. He has been seen by Dr. Ashley Royalty, Dr. Karie Schwalbe and myself as well as seen by rheumatology and pulmonology. CT of lungs showed multiple pulmonary nodules. He has been given multiple antibiotics that don't seem to help and prednisone which seems to help some. He will be better and then have episode like last night where he is weak and shaky and achy all over and then be better the next day. He is having a lot of intermittent sinus pressure and congestion. No fever. He has a dry cough since about January 2025. No SOB. He gets URI symptoms about every 2 weeks.   .. Active Ambulatory Problems    Diagnosis Date Noted   Ulcerative colitis (HCC) 05/27/2007   Perennial allergic rhinitis 10/19/2010   Premature atrial contractions 01/17/2011   Plantar fasciitis, left 03/15/2011   Annual physical exam 03/08/2012   Right distal Biceps tendon tear 12/04/2012   Obstructive uropathy 12/08/2013   Hemangioma of liver 08/06/2014   Hiatal hernia 09/03/2014   Lumbar spinal stenosis 09/30/2014   Left Achilles tendinitis 01/18/2015   Anxiety and depression 02/15/2015   Hyperlipidemia 01/10/2016   Degenerative disc disease, cervical 01/10/2016   Right patellofemoral syndrome 04/12/2016   Acute superficial venous thrombosis of right lower extremity 12/06/2017   Primary osteoarthritis of both hands 02/09/2019   Allergic conjunctivitis 02/09/2019   Bilateral hand swelling 02/23/2019   Left knee injury 12/09/2020   Rotator cuff tear, right 08/11/2021   Patellofemoral syndrome, left 08/11/2021   Arthritis of first metatarsophalangeal (MTP) joint of right foot 05/31/2022    Sore throat 12/21/2022   Polyarthralgia 12/21/2022   Ganglion cyst of flexor pollicis longus tendon sheath of left hand 01/11/2023   Blepharitis 02/27/2021   Lower urinary tract symptoms (LUTS) 10/22/2019   Osteoarthritis 02/27/2021   Radiculopathy, cervical region 04/06/2021   Left-sided chest wall pain 05/16/2023   Scattered groundglass pulmonary opacities 09/19/2023   Chronic cough 10/01/2023   Sinobronchitis 10/11/2023   No energy 10/11/2023   Resolved Ambulatory Problems    Diagnosis Date Noted   SINUSITIS- ACUTE-NOS 09/01/2007   Acute upper respiratory infection 05/27/2007   DERMATITIS, CONTACT, NOS 05/27/2007   COSTOCHONDRITIS, LEFT 09/21/2008   Shortness of breath 02/23/2009   Sprain of wrist 12/30/2009   Preventative health care 10/19/2010   Fatigue 10/19/2010   Knee pain, bilateral 10/19/2010   Bradycardia by electrocardiogram 12/20/2010   Bilateral gastrocnemius strain 02/05/2011   Left elbow pain 05/23/2011   CALF PAIN 02/01/2011   Right hand pain 11/08/2011   Right ankle pain 02/14/2012   Concussion with no loss of consciousness 12/08/2013   Exertional chest pain 06/22/2014   Folliculitis 06/22/2014   Pneumonia 08/04/2014   Pleural effusion 08/04/2014   Elevated blood pressure 08/18/2014   Strain of right gastrocnemius muscle 04/15/2015   Cough 08/05/2015   Onychomycosis 08/05/2015   Strain of calf muscle 08/29/2015   Chronic foot pain, right 12/27/2017   Folliculitis 03/10/2018   Swelling of left wrist 08/13/2019   Blepharitis of both eyes 08/13/2019   Dry eye syndrome 08/13/2019   Conductive hearing  loss in right ear 09/04/2011   Dry eye 04/07/2019   Nuclear sclerotic cataract of both eyes 04/07/2019   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    Objective:     BP 117/74   Pulse 77   Temp 97.8 F (36.6 C) (Temporal)   Ht 5\' 11"  (1.803 m)   Wt 186 lb (84.4 kg)   SpO2 99%   BMI 25.94 kg/m  BP Readings from Last 3 Encounters:  11/06/23 117/74  10/30/23 103/69  10/11/23 (!) 139/93   Wt Readings from Last 3 Encounters:  11/06/23 186 lb (84.4 kg)  10/30/23 185 lb 3.2 oz (84 kg)  10/11/23 188 lb (85.3 kg)    .Marland Kitchen Results for orders placed or performed in visit on 11/06/23  POCT rapid strep A   Collection Time: 11/06/23  2:47 PM  Result Value Ref Range   Rapid Strep A Screen Negative Negative  POCT Influenza A/B   Collection Time: 11/06/23  2:47 PM  Result Value Ref Range   Influenza A, POC Negative Negative   Influenza B, POC Negative Negative  POC COVID-19 BinaxNow   Collection Time: 11/06/23  2:47 PM  Result Value Ref Range   SARS Coronavirus 2 Ag Negative Negative     Physical Exam Constitutional:      Appearance: Normal appearance.  HENT:     Head: Normocephalic.     Right Ear: Tympanic membrane, ear canal and external ear normal. There is no impacted cerumen.     Left Ear: Tympanic membrane, ear canal and external ear normal. There is no impacted cerumen.     Nose: Congestion present.     Mouth/Throat:     Mouth: Mucous membranes are moist.     Pharynx: Posterior oropharyngeal erythema present. No oropharyngeal exudate.  Eyes:     Extraocular Movements: Extraocular movements intact.     Conjunctiva/sclera: Conjunctivae normal.     Pupils: Pupils are equal, round, and reactive to light.  Cardiovascular:     Rate and Rhythm: Normal rate and regular rhythm.  Pulmonary:     Effort: Pulmonary effort is normal.     Breath sounds: Normal breath sounds.  Musculoskeletal:     Cervical back: Normal range of motion and neck supple.     Right lower leg: No edema.     Left lower  leg: No edema.  Lymphadenopathy:     Cervical: No cervical adenopathy.  Neurological:     General: No focal deficit present.     Mental Status: He is alert and oriented to person, place, and time.  Psychiatric:        Mood and Affect: Mood normal.       The 10-year ASCVD risk score (Arnett DK, et al., 2019) is: 7.7%    Assessment & Plan:  Marland KitchenMarland KitchenNewel was seen today for cough.  Diagnoses and all orders for this visit:  Recurrent URI (upper respiratory infection) -     Ambulatory referral to Immunology -     fluticasone (FLONASE) 50 MCG/ACT nasal spray; Place 2 sprays into both nostrils daily. -     CT  MAXILLOFACIAL W CONTRAST; Future  Flu-like symptoms -     POCT rapid strep A -     POCT Influenza A/B -     POC COVID-19 BinaxNow -     Ambulatory referral to Immunology  Sinus pressure -     Ambulatory referral to Immunology -     fluticasone (FLONASE) 50 MCG/ACT nasal spray; Place 2 sprays into both nostrils daily. -     CT MAXILLOFACIAL W CONTRAST; Future  Pulmonary nodules -     Ambulatory referral to Immunology  Elevated C-reactive protein (CRP) -     Ambulatory referral to Immunology  Arthralgia, unspecified joint -     Ambulatory referral to Immunology  Other orders -     Discontinue: predniSONE (STERAPRED UNI-PAK 21 TAB) 10 MG (21) TBPK tablet; 12 day taper pack, use as directed   Pt very frustrated with recurrent illnesses Flu/covid/strep today negative His symptoms are much better today than yesterday Pulmonology believes symptoms have their etiology in rheumatology Rheumatology says the symptoms do not have anything to do with rheumatology Hx of seeing allergy and had allergy testing and positive for mood and other environmental triggers Prednisone taper to start Make sure taking zyrtec daily and start flonase CT of sinuses ordered to see if could be a chronic sinusitis ENT would be a good referral For now referral to immunology   Tandy Gaw,  PA-C

## 2023-11-06 NOTE — Telephone Encounter (Signed)

## 2023-11-06 NOTE — Patient Instructions (Signed)
 CT of sinuses  I would start zyrtec daily

## 2023-11-07 ENCOUNTER — Encounter: Payer: Self-pay | Admitting: Physician Assistant

## 2023-11-07 ENCOUNTER — Other Ambulatory Visit: Payer: Self-pay | Admitting: Physician Assistant

## 2023-11-07 MED ORDER — PREDNISONE 10 MG (21) PO TBPK: ORAL_TABLET | ORAL | 0 refills | Status: DC

## 2023-11-07 NOTE — Addendum Note (Signed)
 Addended by: Monica Becton on: 11/07/2023 01:49 PM   Modules accepted: Orders

## 2023-11-07 NOTE — Telephone Encounter (Signed)
 New mychart message sent by pt:  Benjamin Leonard      Good morning Dr,  I am not getting anywhere with the pulmonologist perhaps it's time to see a different one. Can you recommend someone & perhaps do a referral to hopefully expedite things? Also Jade said yesterday we seem to have by now ruled out this being a bacterial infection and she mentioned the complication of cdiff from too many antibiotics so I am a bit concerned about taking more. I know you mentioned prednisone at our last visit I didn't realize you had called it in. [place] prescribed it as well but [facility] is out of stock & will not have for a few days. I'm really not well today at all, bad headache, sinus pain/pressure, chest hurts, very weak and tired. I am so at a loss.     Dr. Karie Schwalbe, please advise on this what you recommend.

## 2023-11-07 NOTE — Telephone Encounter (Signed)
 Have resent pt's pred[facility]one pack to [facility] off [address] at his request to see if he could get it there.   Pt wanted to know if there could be a recommendation for a different pulmonary office other than the pulmonologist he is currently seeing.   Dr. Karie Schwalbe, please advise.

## 2023-11-07 NOTE — Addendum Note (Signed)
 Addended by: Wyvonne Lenz on: 11/07/2023 01:07 PM   Modules accepted: Orders

## 2023-11-08 ENCOUNTER — Encounter: Payer: Self-pay | Admitting: Physician Assistant

## 2023-11-08 ENCOUNTER — Encounter (INDEPENDENT_AMBULATORY_CARE_PROVIDER_SITE_OTHER): Payer: Self-pay | Admitting: Sports Medicine

## 2023-11-08 DIAGNOSIS — R7982 Elevated C-reactive protein (CRP): Secondary | ICD-10-CM

## 2023-11-08 NOTE — Telephone Encounter (Signed)

## 2023-11-14 ENCOUNTER — Ambulatory Visit: Admitting: Sports Medicine

## 2023-11-14 ENCOUNTER — Telehealth: Payer: Self-pay

## 2023-11-14 ENCOUNTER — Other Ambulatory Visit: Payer: Self-pay

## 2023-11-14 DIAGNOSIS — J069 Acute upper respiratory infection, unspecified: Secondary | ICD-10-CM

## 2023-11-14 DIAGNOSIS — J3489 Other specified disorders of nose and nasal sinuses: Secondary | ICD-10-CM

## 2023-11-14 NOTE — Telephone Encounter (Signed)
 I never ordered this

## 2023-11-14 NOTE — Telephone Encounter (Signed)
 Nellie Banas from [facility] called about patient needing new orders for CT Max Flow Face W/O Contrast, Nellie Banas can be reached at [phone].

## 2023-11-28 ENCOUNTER — Other Ambulatory Visit: Payer: Self-pay | Admitting: Physician Assistant

## 2023-11-28 DIAGNOSIS — J069 Acute upper respiratory infection, unspecified: Secondary | ICD-10-CM

## 2023-11-28 DIAGNOSIS — J3489 Other specified disorders of nose and nasal sinuses: Secondary | ICD-10-CM

## 2023-12-15 ENCOUNTER — Encounter (INDEPENDENT_AMBULATORY_CARE_PROVIDER_SITE_OTHER): Payer: Self-pay | Admitting: Sports Medicine

## 2023-12-15 DIAGNOSIS — B9689 Other specified bacterial agents as the cause of diseases classified elsewhere: Secondary | ICD-10-CM | POA: Diagnosis not present

## 2023-12-15 DIAGNOSIS — J208 Acute bronchitis due to other specified organisms: Secondary | ICD-10-CM

## 2023-12-16 MED ORDER — AMOXICILLIN-POT CLAVULANATE 875-125 MG PO TABS
1.0000 | ORAL_TABLET | Freq: Two times a day (BID) | ORAL | 0 refills | Status: DC
Start: 2023-12-16 — End: 2024-01-02

## 2023-12-16 MED ORDER — CLARITHROMYCIN 500 MG PO TABS
500.0000 mg | ORAL_TABLET | Freq: Two times a day (BID) | ORAL | 0 refills | Status: DC
Start: 2023-12-16 — End: 2024-01-02

## 2023-12-16 NOTE — Telephone Encounter (Signed)

## 2023-12-18 ENCOUNTER — Ambulatory Visit: Admitting: Sports Medicine

## 2023-12-18 ENCOUNTER — Encounter: Payer: Self-pay | Admitting: Sports Medicine

## 2023-12-18 VITALS — BP 115/68 | HR 94 | Resp 20 | Ht 71.0 in | Wt 184.0 lb

## 2023-12-18 DIAGNOSIS — R053 Chronic cough: Secondary | ICD-10-CM

## 2023-12-18 DIAGNOSIS — B9689 Other specified bacterial agents as the cause of diseases classified elsewhere: Secondary | ICD-10-CM | POA: Diagnosis not present

## 2023-12-18 DIAGNOSIS — J208 Acute bronchitis due to other specified organisms: Secondary | ICD-10-CM

## 2023-12-18 MED ORDER — HYDROCOD POLI-CHLORPHE POLI ER 10-8 MG/5ML PO SUER: 5.0000 mL | Freq: Two times a day (BID) | ORAL | 0 refills | Status: DC | PRN

## 2023-12-18 NOTE — Progress Notes (Signed)
    Procedures performed today:    None.  Independent interpretation of notes and tests performed by another provider:   None.  Brief History, Exam, Impression, and Recommendations:    Chronic cough This is a 58 year old male with history of ulcerative colitis, in January he started to have a cough, achiness, malaise, COVID and flu testing were negative, he was treated with antibiotics, steroids, improved but still had a cough. Since then he has had several episodes of waxing and waning cough, malaise, nausea and weakness. He also has endorsed that he has started to have some night sweats. The cough is typically only minimally productive. He has improved historically with steroids and antibiotics. He did see one of my partners, he had an extensive workup for rheumatic disease, CRP was elevated, everything else was essentially normal. CT of the chest from 2 months ago was obtained that did show some bilateral scattered pulmonary nodules with surrounding groundglass appearance, the largest nodule was 19 mm. It was a repeat CT scan in 3 months, so he will need an updated CT chest with contrast next month. He saw a rheumatologist who suggested there was no currently treatable rheumatic disease. He did see a pulmonologist who suggested that the process was rheumatologic, he tells me tuberculosis testing was done but was negative. Bryden is very frustrated with the lack of an answer. He did have another episode, he was prescribed antibiotics and has started to improve. He is interested in another opinion from a different pulmonologist which I think is entirely appropriate. He does want me to do some additional workup, I advised him this was probably best done under the setting of his pulmonologist but I am happy to order some additional testing looking for some of the more rare pulmonary diseases such as vasculitis, sarcoidosis, lymphoma/leukemia panel, Aspergillus testing, alpha-1 antitrypsin  testing, hypersensitivity pneumonitis panel etc. He will let me know when he is ready for me to order his CT scan next month. We will stay tuned with regards to what Dr. Baldwin Levee thinks.  I spent 40 minutes of total time managing this patient today, this includes chart review, face to face, and non-face to face time.  ____________________________________________ Joselyn Nicely. Sandy Crumb, M.D., ABFM., CAQSM., AME. Primary Care and Sports Medicine [facility]  Adjunct Professor of [facility] Medicine  [employer]  Restaurant manager, fast food

## 2023-12-18 NOTE — Assessment & Plan Note (Addendum)
 This is a 58 year old male with history of ulcerative colitis, in January he started to have a cough, achiness, malaise, COVID and flu testing were negative, he was treated with antibiotics, steroids, improved but still had a cough. Since then he has had several episodes of waxing and waning cough, malaise, nausea and weakness. He also has endorsed that he has started to have some night sweats. The cough is typically only minimally productive. He has improved historically with steroids and antibiotics. He did see one of my partners, he had an extensive workup for rheumatic disease, CRP was elevated, everything else was essentially normal. CT of the chest from 2 months ago was obtained that did show some bilateral scattered pulmonary nodules with surrounding groundglass appearance, the largest nodule was 19 mm. It was a repeat CT scan in 3 months, so he will need an updated CT chest with contrast next month. He saw a rheumatologist who suggested there was no currently treatable rheumatic disease. He did see a pulmonologist who suggested that the process was rheumatologic, he tells me tuberculosis testing was done but was negative. Orton is very frustrated with the lack of an answer. He did have another episode, he was prescribed antibiotics and has started to improve. He is interested in another opinion from a different pulmonologist which I think is entirely appropriate. He does want me to do some additional workup, I advised him this was probably best done under the setting of his pulmonologist but I am happy to order some additional testing looking for some of the more rare pulmonary diseases such as vasculitis, sarcoidosis, lymphoma/leukemia panel, Aspergillus testing, alpha-1 antitrypsin testing, hypersensitivity pneumonitis panel etc. He will let me know when he is ready for me to order his CT scan next month. We will stay tuned with regards to what Dr. Baldwin Levee thinks.

## 2023-12-30 ENCOUNTER — Encounter (INDEPENDENT_AMBULATORY_CARE_PROVIDER_SITE_OTHER): Payer: Self-pay | Admitting: Sports Medicine

## 2023-12-30 DIAGNOSIS — F419 Anxiety disorder, unspecified: Secondary | ICD-10-CM

## 2023-12-30 DIAGNOSIS — R053 Chronic cough: Secondary | ICD-10-CM

## 2023-12-30 DIAGNOSIS — R5383 Other fatigue: Secondary | ICD-10-CM | POA: Diagnosis not present

## 2023-12-30 DIAGNOSIS — R059 Cough, unspecified: Secondary | ICD-10-CM

## 2023-12-30 DIAGNOSIS — F32A Depression, unspecified: Secondary | ICD-10-CM

## 2023-12-30 DIAGNOSIS — M791 Myalgia, unspecified site: Secondary | ICD-10-CM

## 2023-12-30 DIAGNOSIS — B9689 Other specified bacterial agents as the cause of diseases classified elsewhere: Secondary | ICD-10-CM

## 2023-12-31 ENCOUNTER — Ambulatory Visit: Payer: Self-pay | Admitting: Sports Medicine

## 2024-01-01 LAB — ANCA TITERS
Atypical pANCA: 1:160 {titer} — ABNORMAL HIGH
C-ANCA: <1:20 {titer}
P-ANCA: <1:20 {titer}

## 2024-01-02 ENCOUNTER — Ambulatory Visit: Admitting: Sports Medicine

## 2024-01-02 VITALS — BP 126/79 | HR 89 | Wt 181.0 lb

## 2024-01-02 DIAGNOSIS — R053 Chronic cough: Secondary | ICD-10-CM | POA: Diagnosis not present

## 2024-01-02 LAB — ASPERGILLUS ANTIGEN, BAL/SERUM: Aspergillus Ag, BAL/Serum: 0.04 {index} (ref 0.00–0.49)

## 2024-01-02 MED ORDER — PREDNISONE 10 MG (48) PO TBPK: ORAL_TABLET | Freq: Every day | ORAL | 0 refills | Status: DC

## 2024-01-02 MED ORDER — TRELEGY ELLIPTA 200-62.5-25 MCG/ACT IN AEPB: INHALATION_SPRAY | RESPIRATORY_TRACT | 0 refills | Status: DC

## 2024-01-02 NOTE — Assessment & Plan Note (Signed)
 Benjamin Leonard returns, he is a pleasant 58 year old male with history of ulcerative colitis, in January he started to have cough, achiness, malaise, COVID and flu testing at the time were negative, he was treated with antibiotics, steroids, he improved but still had a cough, initially suspected to be a typical postviral/postinfectious cough syndrome. The cough was minimally productive, and he did endorse some night sweats. He also endorsed nausea and weakness associated with it. He improved then with some steroids and antibiotics, ultimately he saw 1 my partners, he had an extensive workup for rheumatoid disease, this was negative with the exception of an elevated CRP. A chest CT was obtained that did show some scattered pulmonary nodules with surrounding groundglass appearance the largest of which was 19 mm. It was recommended he have a CT scan in 3 months, this was ordered but has not yet been done. The CT imaging will be available in Care Everywhere. He did see a pulmonologist who suggested rheumatology consultation, rheumatologist suggested no currently treatable rheumatic disease. He had GI evaluation, this resulted in a suggestion that reflux was not at play. He had cardiology evaluation suggesting that there was no cardiac disease. Tuberculosis testing was done but negative, he did request an opinion from a different pulmonologist. [name] is understandably very frustrated with the lack of an answer. He did request that I perform additional workup, I suggested this was best done under the setting of a pulmonologist to avoid any redundant testing but I was happy to order additional workup looking at some of the more rare pulmonary diseases such as vasculitides, sarcoidosis, we did a Aspergillus panel, alpha-1 antitrypsin testing, leukemia/lymphoma panel, hypersensitivity pneumonitis panel. Most of this has come back unrevealing. Steroids have seemed to be somewhat helpful along the way as well so he  will do a prednisone  taper, I have also added Trelegy samples. He will get his updated chest CT ASAP.

## 2024-01-02 NOTE — Progress Notes (Signed)
    Procedures performed today:    None.  Independent interpretation of notes and tests performed by another provider:   None.  Brief History, Exam, Impression, and Recommendations:    Chronic cough Benjamin Leonard returns, he is a pleasant 58 year old male with history of ulcerative colitis, in January he started to have cough, achiness, malaise, COVID and flu testing at the time were negative, he was treated with antibiotics, steroids, he improved but still had a cough, initially suspected to be a typical postviral/postinfectious cough syndrome. The cough was minimally productive, and he did endorse some night sweats. He also endorsed nausea and weakness associated with it. He improved then with some steroids and antibiotics, ultimately he saw 1 my partners, he had an extensive workup for rheumatoid disease, this was negative with the exception of an elevated CRP. A chest CT was obtained that did show some scattered pulmonary nodules with surrounding groundglass appearance the largest of which was 19 mm. It was recommended he have a CT scan in 3 months, this was ordered but has not yet been done. The CT imaging will be available in Care Everywhere. He did see a pulmonologist who suggested rheumatology consultation, rheumatologist suggested no currently treatable rheumatic disease. He had GI evaluation, this resulted in a suggestion that reflux was not at play. He had cardiology evaluation suggesting that there was no cardiac disease. Tuberculosis testing was done but negative, he did request an opinion from a different pulmonologist. [name] is understandably very frustrated with the lack of an answer. He did request that I perform additional workup, I suggested this was best done under the setting of a pulmonologist to avoid any redundant testing but I was happy to order additional workup looking at some of the more rare pulmonary diseases such as vasculitides, sarcoidosis, we did a Aspergillus  panel, alpha-1 antitrypsin testing, leukemia/lymphoma panel, hypersensitivity pneumonitis panel. Most of this has come back unrevealing. Steroids have seemed to be somewhat helpful along the way as well so he will do a prednisone  taper, I have also added Trelegy samples. He will get his updated chest CT ASAP.  I spent 40 minutes of total time managing this patient today, this includes chart review, face to face, and non-face to face time.  ____________________________________________ Benjamin Leonard. Benjamin Leonard, M.D., ABFM., CAQSM., AME. Primary Care and Sports Medicine [facility]  Adjunct Professor of [facility] Medicine  [employer]  [employer]  Restaurant manager, fast food

## 2024-01-03 ENCOUNTER — Telehealth: Payer: Self-pay | Admitting: Sports Medicine

## 2024-01-03 LAB — CBC WITH DIFFERENTIAL/PLATELET
Basophils Absolute: 0.1 10*3/uL (ref 0.0–0.2)
Basos: 1 %
EOS (ABSOLUTE): 0.3 10*3/uL (ref 0.0–0.4)
Eos: 5 %
Hematocrit: 39.8 % (ref 37.5–51.0)
Hemoglobin: 13.4 g/dL (ref 13.0–17.7)
Immature Grans (Abs): 0.1 x10E3/uL (ref 0.0–0.1)
Immature Granulocytes: 2 %
Lymphocytes Absolute: 0.9 x10E3/uL (ref 0.7–3.1)
Lymphs: 13 %
MCH: 30.5 pg (ref 26.6–33.0)
MCHC: 33.7 g/dL (ref 31.5–35.7)
MCV: 91 fL (ref 79–97)
Monocytes Absolute: 0.9 10*3/uL (ref 0.1–0.9)
Monocytes: 14 %
Neutrophils Absolute: 4.2 x10E3/uL (ref 1.4–7.0)
Neutrophils: 65 %
Platelets: 294 x10E3/uL (ref 150–450)
RBC: 4.4 x10E6/uL (ref 4.14–5.80)
RDW: 13 % (ref 11.6–15.4)
WBC: 6.4 x10E3/uL (ref 3.4–10.8)

## 2024-01-03 LAB — HYPERSENSITIVITY PNEUMONITIS
A. Pullulans Abs: NEGATIVE
A.Fumigatus #1 Abs: NEGATIVE
Micropolyspora faeni, IgG: NEGATIVE
Pigeon Serum Abs: NEGATIVE
Thermoact. Saccharii: NEGATIVE
Thermoactinomyces vulgaris, IgG: NEGATIVE

## 2024-01-03 LAB — COMPREHENSIVE METABOLIC PANEL WITH GFR
ALT: 41 IU/L (ref 0–44)
AST: 28 IU/L (ref 0–40)
Albumin: 3.9 g/dL (ref 3.8–4.9)
Alkaline Phosphatase: 107 IU/L (ref 44–121)
BUN/Creatinine Ratio: 18 (ref 9–20)
BUN: 17 mg/dL (ref 6–24)
Bilirubin Total: 0.3 mg/dL (ref 0.0–1.2)
CO2: 24 mmol/L (ref 20–29)
Calcium: 9 mg/dL (ref 8.7–10.2)
Chloride: 100 mmol/L (ref 96–106)
Creatinine, Ser: 0.95 mg/dL (ref 0.76–1.27)
Globulin, Total: 2.5 g/dL (ref 1.5–4.5)
Glucose: 84 mg/dL (ref 70–99)
Potassium: 4.3 mmol/L (ref 3.5–5.2)
Sodium: 138 mmol/L (ref 134–144)
Total Protein: 6.4 g/dL (ref 6.0–8.5)
eGFR: 92 mL/min/{1.73_m2} (ref >59)

## 2024-01-03 LAB — SEDIMENTATION RATE: Sed Rate: 26 mm/h (ref 0–30)

## 2024-01-03 LAB — ANGIOTENSIN CONVERTING ENZYME: Angio Convert Enzyme: 20 U/L (ref 14–82)

## 2024-01-03 LAB — ALPHA-1-ANTITRYPSIN: A-1 Antitrypsin: 228 mg/dL — ABNORMAL HIGH (ref 101–187)

## 2024-01-03 MED ORDER — DULOXETINE HCL 30 MG PO CPEP: 30.0000 mg | ORAL_CAPSULE | Freq: Every day | ORAL | 3 refills | Status: DC

## 2024-01-03 NOTE — Telephone Encounter (Signed)
 Copied from CRM [id number]. Topic: Clinical - Request for Lab/Test Order >> Jan 03, 2024  1:01 PM Tiffany H wrote: Reason for CRM: Deniere with Radiology called to request order for CT - Dr T Patient - transferring to CAL per guidelines.

## 2024-01-03 NOTE — Telephone Encounter (Signed)
 I placed that order a while ago, CT chest from the second of this month.  This was faxed.  If they continue to give you any issues with the CT order just give them a verbal order from me for a CT chest with contrast.

## 2024-01-03 NOTE — Telephone Encounter (Signed)
 Patient dropped off document FMLA, to be filled out by provider. Patient requested to send it back via Call Patient to pick up within 7-days. Document is located in providers tray at front office.Please advise at [facility] [phone]

## 2024-01-03 NOTE — Addendum Note (Signed)
 Addended by: Gean Keels on: 01/03/2024 12:00 PM   Modules accepted: Orders

## 2024-01-03 NOTE — Telephone Encounter (Signed)
[place] will do thank you

## 2024-01-03 NOTE — Telephone Encounter (Signed)
 [name] is just making sure that the order for the CT was sent to [facility] Benjamin Leonard ([facility])

## 2024-01-06 ENCOUNTER — Ambulatory Visit: Payer: Self-pay | Admitting: Sports Medicine

## 2024-01-06 ENCOUNTER — Encounter: Payer: Self-pay | Admitting: Sports Medicine

## 2024-01-15 ENCOUNTER — Encounter: Payer: Self-pay | Admitting: Emergency Medicine

## 2024-01-15 ENCOUNTER — Ambulatory Visit (INDEPENDENT_AMBULATORY_CARE_PROVIDER_SITE_OTHER): Admitting: Emergency Medicine

## 2024-01-15 ENCOUNTER — Inpatient Hospital Stay
Admission: RE | Admit: 2024-01-15 | Discharge: 2024-01-15 | Disposition: A | Discharge status: Home / Self Care | Payer: Self-pay | Source: Ambulatory Visit | Attending: Emergency Medicine | Admitting: Emergency Medicine

## 2024-01-15 VITALS — BP 136/83 | HR 65 | Ht 71.0 in | Wt 177.0 lb

## 2024-01-15 DIAGNOSIS — M791 Myalgia, unspecified site: Secondary | ICD-10-CM | POA: Diagnosis not present

## 2024-01-15 DIAGNOSIS — R9389 Abnormal findings on diagnostic imaging of other specified body structures: Secondary | ICD-10-CM | POA: Insufficient documentation

## 2024-01-15 DIAGNOSIS — K5180 Other ulcerative colitis without complications: Secondary | ICD-10-CM

## 2024-01-15 DIAGNOSIS — R053 Chronic cough: Secondary | ICD-10-CM

## 2024-01-15 NOTE — Assessment & Plan Note (Signed)
 Possible autoimmune inflammatory process Symptoms and elevated ANA suggest autoimmune process. Improvement with prednisone  indicates inflammatory etiology. Systemic symptoms present.  Question whether this correlates with his episode of pneumonitis, chest radiographical findings -I would like for him to go back to see [facility] rheumatology to discuss the new symptoms as he was not having myalgias or arthralgias when he saw them.  Need to correlate this with his lab work

## 2024-01-15 NOTE — Assessment & Plan Note (Signed)
 Abnormal chest CT findings Initial CT showed subcentimeter mediastinal lymph nodes, apical parenchymal scarring, and consolidative opacity. Repeat CT indicated resolution of inflammatory changes, suggesting reversible process, question viral pneumonitis, question underlying autoimmune process. - Order pulmonary function testing. - Repeat chest X-ray to monitor for changes.

## 2024-01-15 NOTE — Patient Instructions (Signed)
 We reviewed your CT scans of the chest today. We will plan to repeat a chest x-ray at your next office visit. We will arrange for pulmonary function testing Stop your Trelegy Please restart your Nexium  once daily for 3 weeks.  Take this medication 1 hour around food. Please restart your fluticasone  nasal spray, 2 sprays each nostril once daily. We will arrange for you to follow-up in rheumatology clinic to discuss your new joint and muscle symptoms. We will try to obtain copies of your rheumatology lab work from your prior pulmonology office. Follow Dr. Baldwin Levee next available after your PFT so we can review those results together.

## 2024-01-15 NOTE — Assessment & Plan Note (Signed)
 Ulcerative colitis Managed with mesalamine. No active immunosuppressive therapy. Potential for crossover inflammatory changes in the lungs.

## 2024-01-15 NOTE — Assessment & Plan Note (Signed)
 Chronic cough Persistent cough post-viral illness, unresponsive to azithromycin  and prednisolone. Possible subclinical reflux and upper airway irritation. Consider also potential autoimmune inflammatory process involvement, crossover with his apparent myalgias, weakness, arthralgias. - Discontinue Trelegy inhaler. - Consider empiric treatment with acid reflux and congestion medications for three weeks. - will eval with full PFT

## 2024-01-15 NOTE — Progress Notes (Signed)
 Subjective:    Patient ID: Benjamin Leonard, male    DOB: 11-18-1965, 58 y.o.   MRN: [id number]  HPI Benjamin Leonard is a 58 year old male with ulcerative colitis who presents with chronic cough and an abnormal CT scan of the chest. He is accompanied by his family. He was referred for evaluation of chronic cough and an abnormal CT scan of the chest.  He has experienced a chronic cough since late January 2025, following an episode of upper respiratory infection symptoms, including malaise and achiness. COVID and flu tests were negative at that time. Despite initial improvement, the cough persisted, leading to further evaluation.  A CT scan of the chest performed on October 18, 2023, revealed subcentimeter mediastinal lymph nodes, apical parenchymal scarring, consolidative opacity in the left upper lobe, parabronchial nodular opacity, ground glass attenuation in the right lower lobe, and scattered nodules.  He has mild chest discomfort described as a 'fullness' since last fall, which led to a rheumatology evaluation due to elevated ANA levels. However, the rheumatologist did not find any significant findings. He was also evaluated by a pulmonologist.  In April 2025, he experienced episodes of severe chills, vomiting, and fatigue, which left him unable to move for several hours. These episodes recurred multiple times over the following months, impacting his ability to work and perform daily activities.  He has been treated with a Z-Pak, prednisone , and prednisolone, which provided limited relief. He is currently on mesalamine for ulcerative colitis and has been for the past 20 years. He has also tried Nexium  and Flonase  for cough management.  He experiences additional symptoms of fatigue, sinus pressure, and chest discomfort, with some discomfort radiating into his throat. He also has new onset of leg pain, particularly in the knees and hips, which responded to prednisone  treatment.   LABS ANA:  Elevated COVID: Negative (07/2023) Flu: Negative (07/2023) Aspergillus panel: Normal Alpha-1 antitrypsin: Normal HSP panel: Normal  RADIOLOGY Chest CT (10/18/2023): Subcentimeter mediastinal lymph nodes, apical periparenchymal scarring, 19 mm consolidative opacity in the medial aspect of the left upper lobe, parabronchial nodular opacity with surrounding ground glass attenuation in the right lower lobe (9 mm), additional scattered nodules in the inferior right lower lobe, subpleural superior right lower lobe, mild diffuse bronchial wall thickening, no effusions, no pneumothorax. Chest CT (01/06/2024): No adenopathy, resolved ground glass inflammatory changes and nodularity.   Review of Systems As per HPI  Past Medical History:  Diagnosis Date   Borderline hypertension    Hearing loss    Right ear w/ hearing aid (new one in 2008)   Hyperlipidemia    OAB (overactive bladder)    or BPH?   Ulcerative colitis      Family History  Problem Relation Age of Onset   Hyperlipidemia Mother    Hypertension Mother    Hypertension Father    Sudden death Father    Hypertension Brother    Diabetes Brother    Arrhythmia Brother    Sudden death Maternal Grandmother    Diabetes Maternal Grandmother    Diabetes Maternal Grandfather    Gout Brother    Arthritis Brother    Healthy Son    Diabetes Daughter    Healthy Daughter    Heart attack Neg Hx      Social History   Socioeconomic History   Marital status: Married    Spouse name: Delwyn Filippo   Number of children: 2   Years of education: Not on file  Highest education level: Bachelor's degree (e.g., BA, AB, BS)  Occupational History   Occupation: Teacher, early years/pre, Auditor, also works for [employer]  Tobacco Use   Smoking status: Never   Smokeless tobacco: Never  Vaping Use   Vaping status: Never Used  Substance and Sexual Activity   Alcohol use: Yes    Alcohol/week: 0.0 standard drinks of alcohol    Comment: rare    Drug use: No    Sexual activity: Not on file  Other Topics Concern   Not on file  Social History Narrative   Moved from [place] in 2008. Mom lives with them.   Regular exercise:  Works out 3 days a week.   2 children   12 and 7   Daughter 58 is type I diabetic      Mother has platelet disorder (thrombocytosis)      Maternal grand parents - DM II   Social Drivers of Corporate investment banker Strain: Low Risk  (09/19/2023)   Overall Financial Resource Strain (CARDIA)    Difficulty of Paying Living Expenses: Not hard at all  Food Insecurity: No Food Insecurity (09/19/2023)   Hunger Vital Sign    Worried About Running Out of Food in the Last Year: Never true    Ran Out of Food in the Last Year: Never true  Transportation Needs: No Transportation Needs (09/19/2023)   PRAPARE - Administrator, Civil Service (Medical): No    Lack of Transportation (Non-Medical): No  Physical Activity: Insufficiently Active (09/19/2023)   Exercise Vital Sign    Days of Exercise per Week: 3 days    Minutes of Exercise per Session: 20 min  Stress: No Stress Concern Present (09/19/2023)   [employer] of Occupational Health - Occupational Stress Questionnaire    Feeling of Stress : Not at all  Social Connections: Moderately Isolated (09/19/2023)   Social Connection and Isolation Panel    Frequency of Communication with Friends and Family: More than three times a week    Frequency of Social Gatherings with Friends and Family: Once a week    Attends Religious Services: Never    Database administrator or Organizations: No    Attends Engineer, structural: Not on file    Marital Status: Married  Catering manager Violence: Not At Risk (06/13/2022)   Received from [facility]   HITS    Over the last 12 months how often did your partner physically hurt you?: Never    Over the last 12 months how often did your partner insult you or talk down to you?: Never    Over the last 12 months how often did your  partner threaten you with physical harm?: Never    Over the last 12 months how often did your partner scream or curse at you?: Never     Allergies  Allergen Reactions   Balsam Rash    Positive Patch Test 09/07/21   Cobalt Rash    Positive Patch Test 09/07/21   Quaternium-15 Rash    Positive Patch Test 09/07/21     Outpatient Medications Prior to Visit  Medication Sig Dispense Refill   chlorpheniramine-HYDROcodone (TUSSIONEX) 10-8 MG/5ML Take 5 mLs by mouth every 12 (twelve) hours as needed for cough (cough, will cause drowsiness.). 115 mL 0   DULoxetine  (CYMBALTA ) 30 MG capsule Take 1 capsule (30 mg total) by mouth daily. 30 capsule 3   esomeprazole  (NEXIUM ) 40 MG capsule Take 1 capsule by mouth daily.  fluticasone  (FLONASE ) 50 MCG/ACT nasal spray SPRAY 2 SPRAYS INTO EACH NOSTRIL EVERY DAY 48 mL 1   Fluticasone -Umeclidin-Vilant (TRELEGY ELLIPTA ) 200-62.5-25 MCG/ACT AEPB 1 puff inhaled daily  0   mesalamine (LIALDA) 1.2 G EC tablet Take by mouth. Take 4 tablets by mouth daily.     Multiple Vitamin (MULTIVITAMIN) tablet Take 1 tablet by mouth daily.     minocycline (MINOCIN) 100 MG capsule Take 100 mg by mouth 2 (two) times daily. (Patient not taking: Reported on 01/15/2024)     predniSONE  (STERAPRED UNI-PAK 48 TAB) 10 MG (48) TBPK tablet Take by mouth daily. 12-day taper pack, use as directed for taper (Patient not taking: Reported on 01/15/2024) 1 tablet 0   No facility-administered medications prior to visit.         Objective:   Physical Exam Vitals:   01/15/24 0928  BP: 136/83  Pulse: 65  SpO2: 98%  Weight: 177 lb (80.3 kg)  Height: 5' 11 (1.803 m)   Gen: Pleasant, well-nourished, in no distress,  normal affect  ENT: No lesions,  mouth clear,  oropharynx clear, no postnasal drip  Neck: No JVD, no stridor  Lungs: No use of accessory muscles, no crackles or wheezing on normal respiration, no wheeze on forced expiration  Cardiovascular: RRR, heart sounds normal, no  murmur or gallops, no peripheral edema  Musculoskeletal: No deformities, no cyanosis or clubbing  Neuro: alert, awake, non focal  Skin: Warm, no lesions or rash     Assessment & Plan:  Chronic cough Chronic cough Persistent cough post-viral illness, unresponsive to azithromycin  and prednisolone. Possible subclinical reflux and upper airway irritation. Consider also potential autoimmune inflammatory process involvement, crossover with his apparent myalgias, weakness, arthralgias. - Discontinue Trelegy inhaler. - Consider empiric treatment with acid reflux and congestion medications for three weeks. - will eval with full PFT    Abnormal CT of the chest Abnormal chest CT findings Initial CT showed subcentimeter mediastinal lymph nodes, apical parenchymal scarring, and consolidative opacity. Repeat CT indicated resolution of inflammatory changes, suggesting reversible process, question viral pneumonitis, question underlying autoimmune process. - Order pulmonary function testing. - Repeat chest X-ray to monitor for changes.   Myalgia Possible autoimmune inflammatory process Symptoms and elevated ANA suggest autoimmune process. Improvement with prednisone  indicates inflammatory etiology. Systemic symptoms present.  Question whether this correlates with his episode of pneumonitis, chest radiographical findings -I would like for him to go back to see [facility] rheumatology to discuss the new symptoms as he was not having myalgias or arthralgias when he saw them.  Need to correlate this with his lab work   Ulcerative colitis (HCC) Ulcerative colitis Managed with mesalamine. No active immunosuppressive therapy. Potential for crossover inflammatory changes in the lungs.    Racheal Buddle, MD, PhD 01/15/2024, 5:19 PM [facility] [phone] or if no answer before 7:00PM call [phone] For any issues after 7:00PM please call eLink [phone]

## 2024-01-22 ENCOUNTER — Encounter: Payer: Self-pay | Admitting: Emergency Medicine

## 2024-01-22 NOTE — Telephone Encounter (Signed)
**Note De-identified  [place] Obfuscation** Please advise 

## 2024-01-23 MED ORDER — PREDNISONE 10 MG (48) PO TBPK: ORAL_TABLET | Freq: Every day | ORAL | 0 refills | Status: DC

## 2024-01-23 NOTE — Addendum Note (Signed)
 Addended by: CURTIS DEBBY PARAS on: 01/23/2024 09:42 AM   Modules accepted: Orders

## 2024-01-23 NOTE — Telephone Encounter (Signed)

## 2024-01-24 ENCOUNTER — Ambulatory Visit: Admitting: Sports Medicine

## 2024-01-26 ENCOUNTER — Other Ambulatory Visit: Payer: Self-pay | Admitting: Sports Medicine

## 2024-01-26 DIAGNOSIS — F32A Depression, unspecified: Secondary | ICD-10-CM

## 2024-01-28 ENCOUNTER — Ambulatory Visit: Admitting: Sports Medicine

## 2024-01-28 ENCOUNTER — Encounter: Payer: Self-pay | Admitting: Sports Medicine

## 2024-01-28 VITALS — BP 107/68 | HR 93 | Resp 20 | Ht 71.0 in

## 2024-01-28 DIAGNOSIS — R5383 Other fatigue: Secondary | ICD-10-CM | POA: Diagnosis not present

## 2024-01-28 DIAGNOSIS — N139 Obstructive and reflux uropathy, unspecified: Secondary | ICD-10-CM | POA: Diagnosis not present

## 2024-01-28 DIAGNOSIS — L03032 Cellulitis of left toe: Secondary | ICD-10-CM | POA: Insufficient documentation

## 2024-01-28 DIAGNOSIS — R053 Chronic cough: Secondary | ICD-10-CM | POA: Diagnosis not present

## 2024-01-28 MED ORDER — MUPIROCIN 2 % EX OINT
TOPICAL_OINTMENT | CUTANEOUS | 3 refills | Status: AC
Start: 2024-01-28 — End: ?

## 2024-01-28 MED ORDER — PROMETHAZINE-DM 6.25-15 MG/5ML PO SYRP: 5.0000 mL | ORAL_SOLUTION | Freq: Four times a day (QID) | ORAL | 0 refills | Status: DC | PRN

## 2024-01-28 MED ORDER — BENZONATATE 200 MG PO CAPS: 200.0000 mg | ORAL_CAPSULE | Freq: Three times a day (TID) | ORAL | 0 refills | Status: DC | PRN

## 2024-01-28 NOTE — Assessment & Plan Note (Signed)
 Benjamin Leonard has undergone an extensive workup for lack of energy. He is working with rheumatology, pulmonology. Today he told me he cured himself. He tells me he was taking some propel electrolyte solutions several times per day. He stopped it and he felt better. I did let him know that we have checked his electrolytes on multiple occasions and they were all normal, and that unfortunately I did not think this was the likely cause, I have also asked him not to finish the workup with rheumatology and pulmonology due to a hiatus in symptoms. I am not going to discount his suspicions that the propel electrolyte solution was causative, but I warned him not to be too optimistic about this.

## 2024-01-28 NOTE — Assessment & Plan Note (Signed)
 Lateral paronychia, [place] did cut out the lateral nail deep into the lateral nail fold, I am concerned that it is now going to grow under the lateral and distal nail folds. He will do topical mupirocin , warm compresses however if this does not improve we will have to do a lateral nail plate excision with phenol matricectomy.

## 2024-01-28 NOTE — Progress Notes (Signed)
    Procedures performed today:    None.  Independent interpretation of notes and tests performed by another provider:   None.  Brief History, Exam, Impression, and Recommendations:    Paronychia of great toe of left foot Lateral paronychia, Benjamin Leonard did cut out the lateral nail deep into the lateral nail fold, I am concerned that it is now going to grow under the lateral and distal nail folds. He will do topical mupirocin , warm compresses however if this does not improve we will have to do a lateral nail plate excision with phenol matricectomy.  No energy Benjamin Leonard has undergone an extensive workup for lack of energy. He is working with rheumatology, pulmonology. Today he told me he cured himself. He tells me he was taking some propel electrolyte solutions several times per day. He stopped it and he felt better. I did let him know that we have checked his electrolytes on multiple occasions and they were all normal, and that unfortunately I did not think this was the likely cause, I have also asked him not to finish the workup with rheumatology and pulmonology due to a hiatus in symptoms. I am not going to discount his suspicions that the propel electrolyte solution was causative, but I warned him not to be too optimistic about this.    ____________________________________________ Debby PARAS. Curtis, M.D., ABFM., CAQSM., AME. Primary Care and Sports Medicine [facility]  Adjunct Professor of [facility] Medicine  [employer]  Restaurant manager, fast food

## 2024-01-28 NOTE — Addendum Note (Signed)
 Addended by: CURTIS DEBBY PARAS on: 01/28/2024 09:37 AM   Modules accepted: Orders

## 2024-01-29 ENCOUNTER — Ambulatory Visit: Payer: Self-pay | Admitting: Sports Medicine

## 2024-01-29 LAB — PSA, TOTAL AND FREE
PSA, Free Pct: 26.2 %
PSA, Free: 0.34 ng/mL
Prostate Specific Ag, Serum: 1.3 ng/mL (ref 0.0–4.0)

## 2024-02-05 ENCOUNTER — Encounter: Payer: Self-pay | Admitting: Sports Medicine

## 2024-02-05 ENCOUNTER — Telehealth: Payer: Self-pay

## 2024-02-05 NOTE — Telephone Encounter (Signed)
 Copied from CRM [id number]. Topic: Appointments - Appointment Scheduling >> Feb 03, 2024  8:29 AM Joesph PARAS wrote: Patient/patient representative is calling to schedule an appointment. Refer to attachments for appointment information.  Patient is noted for a f/u for PFT after 07/30. Scheduled for first available of 09/10 but patient very unhappy and requesting appointment be made much sooner, if not on the same day of PFT. >> Feb 04, 2024  3:44 PM Thersia S wrote: Schedule is booked up until September, could I add patient to blocked slot? Please advise.   Called and spoke with the patient. PFT & follow-up appt have been moved to same day per patient request. Pt is happy with the changes and is aware of appointments. Nothing further needed.

## 2024-02-26 ENCOUNTER — Encounter

## 2024-03-16 ENCOUNTER — Encounter: Payer: Self-pay | Admitting: Urgent Care

## 2024-03-16 ENCOUNTER — Encounter: Payer: Self-pay | Admitting: Sports Medicine

## 2024-03-16 ENCOUNTER — Ambulatory Visit: Admitting: Urgent Care

## 2024-03-16 VITALS — BP 131/82 | HR 89 | Temp 98.3°F | Resp 18 | Ht 71.0 in | Wt 182.0 lb

## 2024-03-16 DIAGNOSIS — M65931 Unspecified synovitis and tenosynovitis, right forearm: Secondary | ICD-10-CM

## 2024-03-16 DIAGNOSIS — U071 COVID-19: Secondary | ICD-10-CM | POA: Diagnosis not present

## 2024-03-16 LAB — POC COVID19/FLU A&B COMBO
Covid Antigen, POC: POSITIVE — AB
Influenza A Antigen, POC: NEGATIVE
Influenza B Antigen, POC: NEGATIVE

## 2024-03-16 MED ORDER — DICLOFENAC SODIUM 75 MG PO TBEC: 75.0000 mg | DELAYED_RELEASE_TABLET | Freq: Two times a day (BID) | ORAL | 0 refills | Status: DC

## 2024-03-16 NOTE — Progress Notes (Signed)
 Established Patient Office Visit  Subjective:  Patient ID: Benjamin Leonard, male    DOB: August 02, 1965  Age: 58 y.o. MRN: [id number]  Chief Complaint  Patient presents with   Cough    X5 days tested + for covid last thus   Wrist Pain    Right wrist x3-4 days swolllen and painful    Cough  Wrist Pain     Discussed the use of AI scribe software for clinical note transcription with the patient, who gave verbal consent to proceed.  History of Present Illness   Benjamin Leonard is a 58 year old male who presents with wrist pain and swelling.  He has been experiencing wrist pain and swelling that began early last week. The pain initially occurred during daily activities, such as carrying items and working part-time at [employer], particularly when pushing boxes down rollers.  He used an old brace that wraps around the thumb, which initially improved his wrist mobility by Friday night. 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 The wrist is now swollen and discolored, and the patient reports significant difficulty moving it, primarily with flexion, not so much extension. No falls or hearing a pop, but the pain worsens with certain movements, such as squeezing his fingers or picking up a phone. The pain is now more localized to the upper part of the wrist, whereas it was lower last week. He also mentions a bruise and swelling that were not present before using the brace.  He has not taken any oral medications for the pain and denies any history of gout. He reports full sensation in his fingers but notes soreness when certain areas are pressed. He describes stiffness in the wrist and difficulty with certain movements.  Regarding his recent COVID-19 infection, he tested positive last Thursday after his son tested positive. His wife also tested positive on Saturday. He initially had a cough early last week, which  resolved after two days, and he felt better by the time he tested positive. He has not taken any antiviral treatment as he is feeling better.       Patient Active Problem List   Diagnosis Date Noted   Paronychia of great toe of left foot 01/28/2024   Abnormal CT of the chest 01/15/2024   Myalgia 01/15/2024   Sinobronchitis 10/11/2023   No energy 10/11/2023   Chronic cough 10/01/2023   Scattered groundglass pulmonary opacities 09/19/2023   Left-sided chest wall pain 05/16/2023   Ganglion cyst of flexor pollicis longus tendon sheath of left hand 01/11/2023   Sore throat 12/21/2022   Polyarthralgia 12/21/2022   Arthritis of first metatarsophalangeal (MTP) joint of right foot 05/31/2022   Rotator cuff tear, right 08/11/2021   Patellofemoral syndrome, left 08/11/2021   Radiculopathy, cervical region 04/06/2021   Blepharitis 02/27/2021   Osteoarthritis 02/27/2021   Left knee injury 12/09/2020   Lower urinary tract symptoms (LUTS) 10/22/2019   Bilateral hand swelling 02/23/2019   Primary osteoarthritis of both hands 02/09/2019   Allergic conjunctivitis 02/09/2019   Acute superficial venous thrombosis of right lower extremity 12/06/2017   Right patellofemoral syndrome 04/12/2016   Hyperlipidemia 01/10/2016   Degenerative disc disease, cervical 01/10/2016   Anxiety and depression 02/15/2015   Left Achilles tendinitis 01/18/2015   Lumbar spinal stenosis 09/30/2014   Hiatal hernia 09/03/2014   Hemangioma of liver 08/06/2014   Obstructive uropathy 12/08/2013   Right distal Biceps tendon  tear 12/04/2012   Annual physical exam 03/08/2012   Plantar fasciitis, left 03/15/2011   Premature atrial contractions 01/17/2011   Perennial allergic rhinitis 10/19/2010   Ulcerative colitis (HCC) 05/27/2007   Past Medical History:  Diagnosis Date   Borderline hypertension    Hearing loss    Right ear w/ hearing aid (new one in 2008)   Hyperlipidemia    OAB (overactive bladder)    or BPH?    Ulcerative colitis    Past Surgical History:  Procedure Laterality Date   BACK SURGERY     BICEPS TENDON REPAIR Right 2016   RHINOPLASTY  1997   right ear surgery for scar tissue removal  1996   Social History   Tobacco Use   Smoking status: Never   Smokeless tobacco: Never  Vaping Use   Vaping status: Never Used  Substance Use Topics   Alcohol use: Yes    Alcohol/week: 0.0 standard drinks of alcohol    Comment: rare    Drug use: No      ROS: as noted in HPI  Objective:     BP 131/82   Pulse 89   Temp 98.3 F (36.8 C) (Oral)   Resp 18   Ht 5' 11 (1.803 m)   Wt 182 lb (82.6 kg)   SpO2 100%   BMI 25.38 kg/m  BP Readings from Last 3 Encounters:  03/16/24 131/82  01/28/24 107/68  01/15/24 136/83   Wt Readings from Last 3 Encounters:  03/16/24 182 lb (82.6 kg)  01/15/24 177 lb (80.3 kg)  01/02/24 181 lb (82.1 kg)      Physical Exam Vitals and nursing note reviewed.  Constitutional:      Appearance: Normal appearance. He is normal weight.  HENT:     Head: Normocephalic.     Nose:     Comments: Mask in place Eyes:     General: No scleral icterus.       Right eye: No discharge.        Left eye: No discharge.  Cardiovascular:     Rate and Rhythm: Normal rate.  Musculoskeletal:     Right upper arm: Normal.     Right elbow: Tenderness (slight tenderness to medial epicondyle) present in medial epicondyle.     Right forearm: Swelling and tenderness present.     Right wrist: Swelling present. No bony tenderness or snuff box tenderness. Decreased range of motion. Normal pulse.     Right hand: Normal. No swelling, tenderness or bony tenderness. Normal pulse.       Arms:     Comments: Swelling to distal anterior forearm near wrist, with pain extending up to medial epicondyle  Neurological:     Mental Status: He is alert.      Results for orders placed or performed in visit on 03/16/24  POC Covid19/Flu A&B Antigen  Result Value Ref Range   Influenza A  Antigen, POC Negative Negative   Influenza B Antigen, POC Negative Negative   Covid Antigen, POC Positive (A) Negative    Last CBC Lab Results  Component Value Date   WBC 6.4 12/30/2023   HGB 13.4 12/30/2023   HCT 39.8 12/30/2023   MCV 91 12/30/2023   MCH 30.5 12/30/2023   RDW 13.0 12/30/2023   PLT 294 12/30/2023   Last metabolic panel Lab Results  Component Value Date   GLUCOSE 84 12/30/2023   NA 138 12/30/2023   K 4.3 12/30/2023   CL 100 12/30/2023  CO2 24 12/30/2023   BUN 17 12/30/2023   CREATININE 0.95 12/30/2023   EGFR 92 12/30/2023   CALCIUM  9.0 12/30/2023   PROT 6.4 12/30/2023   ALBUMIN 3.9 12/30/2023   LABGLOB 2.5 12/30/2023   BILITOT 0.3 12/30/2023   ALKPHOS 107 12/30/2023   AST 28 12/30/2023   ALT 41 12/30/2023      The 10-year ASCVD risk score (Arnett DK, et al., 2019) is: 9.3%  Assessment & Plan:  Tenosynovitis of right wrist -     Diclofenac  Sodium; Take 1 tablet (75 mg total) by mouth 2 (two) times daily with a meal.  Dispense: 28 tablet; Refill: 0  COVID-19 -     POC Covid19/Flu A&B Antigen  Assessment and Plan    Tenosynovitis right wrist/forearm  with swelling, bruising, and pain due to overuse. No gout or trauma history. - Advise wearing wrist brace continuously for two weeks, remove only for showering. - Prescribe anti-inflammatory medication. - Advise against using right arm for two weeks.   Covid-19 Pt has improved and is past treatment window. Continue OTC meds as needed.        Return in about 2 weeks (around 03/30/2024).   Benton LITTIE Gave, PA

## 2024-03-16 NOTE — Patient Instructions (Addendum)
 Please wear the wrist brace x 14 days. Please take the anti-inflammatory medication called in today twice daily with food. Do not take any additional OTC NSAIDS (advil, motrin, ibuprofen, aleve, naproxen).    Follow up with PCP in 14 days if no resolution to pain.  You are positive for covid still but no further treatment indicated.

## 2024-03-17 ENCOUNTER — Encounter: Payer: Self-pay | Admitting: Urgent Care

## 2024-03-17 ENCOUNTER — Telehealth: Payer: Self-pay | Admitting: Urgent Care

## 2024-03-17 ENCOUNTER — Telehealth: Payer: Self-pay

## 2024-03-17 NOTE — Telephone Encounter (Signed)
 Called patient and informed that short term disability form has been placed at front desk for pick up .  Copy placed in scan folder and kept in my faxed items folder at my desk.

## 2024-03-17 NOTE — Telephone Encounter (Signed)
 Patient dropped off document FMLA, to be filled out by provider. Patient requested to send it back via Call Patient to pick up within ASAP. Document is located in providers tray at front office.Please advise at [facility] [phone]

## 2024-03-23 ENCOUNTER — Ambulatory Visit: Admitting: Sports Medicine

## 2024-03-23 ENCOUNTER — Ambulatory Visit: Admitting: Family Medicine

## 2024-03-23 ENCOUNTER — Encounter: Payer: Self-pay | Admitting: Family Medicine

## 2024-03-23 VITALS — BP 157/79 | HR 61 | Ht 71.0 in | Wt 184.0 lb

## 2024-03-23 DIAGNOSIS — M65931 Unspecified synovitis and tenosynovitis, right forearm: Secondary | ICD-10-CM | POA: Insufficient documentation

## 2024-03-23 NOTE — Progress Notes (Signed)
 [name] - 58 y.o. male MRN [id number]  Date of birth: 12/20/1965  Subjective Chief Complaint  Patient presents with   Medical Clearance    HPI Benjamin Leonard is a 58 y.o. male here today for follow-up of right wrist pain.  Diagnosed with tenosynovitis of the right wrist.  He has been using wrist brace as well as using meloxicam .  He has had significant improvement of symptoms.  He would like to return to work at this point.  He does work as a Academic librarian at [employer].  ROS:  A comprehensive ROS was completed and negative except as noted per HPI  Allergies  Allergen Reactions   Balsam Rash    Positive Patch Test 09/07/21   Cobalt Rash    Positive Patch Test 09/07/21   Quaternium-15 Rash    Positive Patch Test 09/07/21    Past Medical History:  Diagnosis Date   Borderline hypertension    Hearing loss    Right ear w/ hearing aid (new one in 2008)   Hyperlipidemia    OAB (overactive bladder)    or BPH?   Ulcerative colitis     Past Surgical History:  Procedure Laterality Date   BACK SURGERY     BICEPS TENDON REPAIR Right 2016   RHINOPLASTY  1997   right ear surgery for scar tissue removal  1996    Social History   Socioeconomic History   Marital status: Married    Spouse name: Graig Caldron   Number of children: 2   Years of education: Not on file   Highest education level: Bachelor's degree (e.g., BA, AB, BS)  Occupational History   Occupation: Teacher, early years/pre, Auditor, also works for [employer]  Tobacco Use   Smoking status: Never   Smokeless tobacco: Never  Vaping Use   Vaping status: Never Used  Substance and Sexual Activity   Alcohol use: Yes    Alcohol/week: 0.0 standard drinks of alcohol    Comment: rare    Drug use: No   Sexual activity: Not on file  Other Topics Concern   Not on file  Social History Narrative   Moved from [place] in 2008. Mom lives with them.   Regular exercise:  Works out 3 days a week.   2 children   12 and 7   Daughter 54 is type I  diabetic      Mother has platelet disorder (thrombocytosis)      Maternal grand parents - DM II   Social Drivers of Corporate investment banker Strain: Low Risk  (01/23/2024)   Overall Financial Resource Strain (CARDIA)    Difficulty of Paying Living Expenses: Not hard at all  Food Insecurity: No Food Insecurity (01/23/2024)   Hunger Vital Sign    Worried About Running Out of Food in the Last Year: Never true    Ran Out of Food in the Last Year: Never true  Transportation Needs: No Transportation Needs (01/23/2024)   PRAPARE - Administrator, Civil Service (Medical): No    Lack of Transportation (Non-Medical): No  Physical Activity: Unknown (01/23/2024)   Exercise Vital Sign    Days of Exercise per Week: Patient declined    Minutes of Exercise per Session: Not on file  Stress: No Stress Concern Present (01/23/2024)   [employer] of Occupational Health - Occupational Stress Questionnaire    Feeling of Stress: Only a little  Social Connections: Moderately Isolated (01/23/2024)   Social Connection and Isolation  Panel    Frequency of Communication with Friends and Family: Twice a week    Frequency of Social Gatherings with Friends and Family: Once a week    Attends Religious Services: Never    Database administrator or Organizations: No    Attends Engineer, structural: Not on file    Marital Status: Married    Family History  Problem Relation Age of Onset   Hyperlipidemia Mother    Hypertension Mother    Hypertension Father    Sudden death Father    Hypertension Brother    Diabetes Brother    Arrhythmia Brother    Sudden death Maternal Grandmother    Diabetes Maternal Grandmother    Diabetes Maternal Grandfather    Gout Brother    Arthritis Brother    Healthy Son    Diabetes Daughter    Healthy Daughter    Heart attack Neg Hx     Health Maintenance  Topic Date Due   Hepatitis B Vaccines 19-59 Average Risk (1 of 3 - 19+ 3-dose series) Never  done   Pneumococcal Vaccine: 50+ Years (2 of 2 - PCV) 04/12/2017   COVID-19 Vaccine (6 - 2024-25 season) 03/31/2023   Colonoscopy  05/05/2027   DTaP/Tdap/Td (5 - Td or Tdap) 02/08/2029   Hepatitis C Screening  Completed   HIV Screening  Completed   Zoster Vaccines- Shingrix   Completed   HPV VACCINES  Aged Out   Meningococcal B Vaccine  Aged Out   INFLUENZA VACCINE  Discontinued     ----------------------------------------------------------------------------------------------------------------------------------------------------------------------------------------------------------------- Physical Exam BP (!) 157/79 (BP Location: Left Arm, Patient Position: Sitting, Cuff Size: Normal)   Pulse 61   Ht 5' 11 (1.803 m)   Wt 184 lb (83.5 kg)   SpO2 100%   BMI 25.66 kg/m   Physical Exam Constitutional:      Appearance: Normal appearance.  Cardiovascular:     Rate and Rhythm: Normal rate and regular rhythm.  Pulmonary:     Effort: Pulmonary effort is normal.     Breath sounds: Normal breath sounds.  Neurological:     General: No focal deficit present.     Mental Status: He is alert.  Psychiatric:        Mood and Affect: Mood normal.        Behavior: Behavior normal.     ------------------------------------------------------------------------------------------------------------------------------------------------------------------------------------------------------------------- Assessment and Plan  Tenosynovitis of right wrist Pain and symptoms have improved adequately enough for him to return to work.  Recommend continuation of brace as well as meloxicam  for now.  He will let us  know if symptoms are worsening.   No orders of the defined types were placed in this encounter.   No follow-ups on file.

## 2024-03-23 NOTE — Assessment & Plan Note (Signed)
 Pain and symptoms have improved adequately enough for him to return to work.  Recommend continuation of brace as well as meloxicam  for now.  He will let us  know if symptoms are worsening.

## 2024-03-25 ENCOUNTER — Ambulatory Visit: Admitting: Emergency Medicine

## 2024-03-25 ENCOUNTER — Encounter

## 2024-03-31 ENCOUNTER — Encounter: Payer: Self-pay | Admitting: Sports Medicine

## 2024-04-08 ENCOUNTER — Ambulatory Visit: Admitting: Emergency Medicine

## 2024-08-18 ENCOUNTER — Ambulatory Visit: Admitting: Physician Assistant

## 2024-08-18 VITALS — BP 125/76 | HR 84 | Temp 98.7°F | Ht 71.0 in | Wt 187.8 lb

## 2024-08-18 DIAGNOSIS — Z20822 Contact with and (suspected) exposure to covid-19: Secondary | ICD-10-CM

## 2024-08-18 DIAGNOSIS — Z79899 Other long term (current) drug therapy: Secondary | ICD-10-CM | POA: Diagnosis not present

## 2024-08-18 DIAGNOSIS — R5383 Other fatigue: Secondary | ICD-10-CM | POA: Diagnosis not present

## 2024-08-18 DIAGNOSIS — R5381 Other malaise: Secondary | ICD-10-CM

## 2024-08-18 DIAGNOSIS — R899 Unspecified abnormal finding in specimens from other organs, systems and tissues: Secondary | ICD-10-CM | POA: Diagnosis not present

## 2024-08-18 DIAGNOSIS — R809 Proteinuria, unspecified: Secondary | ICD-10-CM | POA: Diagnosis not present

## 2024-08-18 DIAGNOSIS — Z8739 Personal history of other diseases of the musculoskeletal system and connective tissue: Secondary | ICD-10-CM

## 2024-08-18 LAB — POCT UA - MICROALBUMIN
Creatinine, POC: 50 mg/dL
Microalbumin Ur, POC: 10 mg/L

## 2024-08-18 LAB — POC SOFIA 2 FLU + SARS ANTIGEN FIA
Influenza A, POC: NEGATIVE
Influenza B, POC: NEGATIVE
SARS Coronavirus 2 Ag: NEGATIVE

## 2024-08-18 NOTE — Progress Notes (Signed)
 "  New Patient Office Visit  Subjective    Patient ID: Benjamin Leonard, male    DOB: 1965/09/21  Age: 59 y.o. MRN: [id number]  CC:  Chief Complaint  Patient presents with   New Patient (Initial Visit)    Here to est PCP with Vermell Bologna from Dr. Curtis.   abnormal kidney function lab work    Covid Exposure    Patient wife exposded to someone dx with COVID - has had congestion and fatigue x Sunday 08/16/2024    HPI .Discussed the use of AI scribe software for clinical note transcription with the patient, who gave verbal consent to proceed.  History of Present Illness Benjamin Leonard is a 59 year old male who presents to establish care and for medication refills.  Viral exposure and upper respiratory symptoms - Recent exposure to COVID-19 through his wife, who was in contact with a COVID-positive individual - Onset of symptoms the day prior to visit: sinus congestion, headache, mild muscle aches, mild sore throat - No fever - History of two or three prior COVID-19 infections, each with mild symptoms  Musculoskeletal symptoms - Significant elbow pain and swelling earlier this month, initially suspected to be gout but not confirmed by testing - Symptoms resolved with antibiotics - Recent mild muscle aches associated with current viral symptoms  Renal and urinary symptoms - Recent onset of foamy urine - Low back pain - History of elevated free kappa and lambda light chains on prior blood work, not followed up - Concern for possible kidney involvement  Systemic inflammatory symptoms and prior severe illness - Severe illness last year with symptoms severe enough to leave him on the bathroom floor - Blood tests at that time suggested possible rheumatoid arthritis or lupus, leading to rheumatology referral - Extensive blood work performed  Pulmonary and hepatic findings - History of lung nodules, initially detected but later resolved - History of hepatic cyst, noted on one  test but not present on subsequent imaging  Medication use and management - Currently taking duloxetine  and Nexium  - Nexium  managed by gastroenterologist - No medication refills required at this time  Dietary and lifestyle factors - History of consuming large amounts of Propel electrolytes, believed to have contributed to prior symptoms - Significant improvement in health after discontinuing Propel    Outpatient Encounter Medications as of 08/18/2024  Medication Sig   benzonatate  (TESSALON ) 200 MG capsule Take 1 capsule (200 mg total) by mouth 3 (three) times daily as needed for cough.   clindamycin (CLINDAGEL) 1 % gel Apply 1 Application topically daily.   DULoxetine  (CYMBALTA ) 30 MG capsule TAKE 1 CAPSULE BY MOUTH EVERY DAY   esomeprazole  (NEXIUM ) 40 MG capsule Take 1 capsule by mouth daily.   fluticasone  (FLONASE ) 50 MCG/ACT nasal spray SPRAY 2 SPRAYS INTO EACH NOSTRIL EVERY DAY   Fluticasone -Umeclidin-Vilant (TRELEGY ELLIPTA ) 200-62.5-25 MCG/ACT AEPB 1 puff inhaled daily   mesalamine (LIALDA) 1.2 G EC tablet Take by mouth. Take 4 tablets by mouth daily.   minocycline (MINOCIN) 100 MG capsule Take 100 mg by mouth 2 (two) times daily.   Multiple Vitamin (MULTIVITAMIN) tablet Take 1 tablet by mouth daily.   mupirocin  ointment (BACTROBAN ) 2 % Apply to affected area TID for 7 days.   [DISCONTINUED] betamethasone dipropionate 0.05 % cream Apply 1 Application topically 2 (two) times daily. (Patient not taking: Reported on 08/18/2024)   [DISCONTINUED] diclofenac  (VOLTAREN ) 75 MG EC tablet Take 1 tablet (75 mg total) by mouth 2 (two) times  daily with a meal. (Patient not taking: Reported on 08/18/2024)   [DISCONTINUED] predniSONE  (STERAPRED UNI-PAK 48 TAB) 10 MG (48) TBPK tablet Take by mouth daily. 12-day taper pack, use as directed for taper (Patient not taking: Reported on 08/18/2024)   [DISCONTINUED] promethazine -dextromethorphan (PROMETHAZINE -DM) 6.25-15 MG/5ML syrup Take 5 mLs by mouth 4  (four) times daily as needed for cough. (Patient not taking: Reported on 08/18/2024)   No facility-administered encounter medications on file as of 08/18/2024.    Past Medical History:  Diagnosis Date   Borderline hypertension    Hearing loss    Right ear w/ hearing aid (new one in 2008)   Hyperlipidemia    OAB (overactive bladder)    or BPH?   Ulcerative colitis     Past Surgical History:  Procedure Laterality Date   BACK SURGERY     BICEPS TENDON REPAIR Right 2016   RHINOPLASTY  1997   right ear surgery for scar tissue removal  1996    Family History  Problem Relation Age of Onset   Hyperlipidemia Mother    Hypertension Mother    Hypertension Father    Sudden death Father    Hypertension Brother    Diabetes Brother    Arrhythmia Brother    Sudden death Maternal Grandmother    Diabetes Maternal Grandmother    Diabetes Maternal Grandfather    Gout Brother    Arthritis Brother    Healthy Son    Diabetes Daughter    Healthy Daughter    Heart attack Neg Hx     Social History   Socioeconomic History   Marital status: Married    Spouse name: Graig Caldron   Number of children: 2   Years of education: Not on file   Highest education level: Bachelor's degree (e.g., BA, AB, BS)  Occupational History   Occupation: Teacher, Early Years/pre, Auditor, also works for [employer]  Tobacco Use   Smoking status: Never   Smokeless tobacco: Never  Vaping Use   Vaping status: Never Used  Substance and Sexual Activity   Alcohol use: Yes    Alcohol/week: 0.0 standard drinks of alcohol    Comment: rare    Drug use: No   Sexual activity: Not on file  Other Topics Concern   Not on file  Social History Narrative   Moved from [place] in 2008. Mom lives with them.   Regular exercise:  Works out 3 days a week.   2 children   12 and 7   Daughter 45 is type I diabetic      Mother has platelet disorder (thrombocytosis)      Maternal grand parents - DM II   Social Drivers of Health   Tobacco Use:  Low Risk (07/29/2024)   Received from [facility]   Patient History    Smoking Tobacco Use: Never    Smokeless Tobacco Use: Never    Passive Exposure: Not on file  Financial Resource Strain: Low Risk (08/17/2024)   Overall Financial Resource Strain (CARDIA)    Difficulty of Paying Living Expenses: Not hard at all  Food Insecurity: No Food Insecurity (08/17/2024)   Epic    Worried About Programme Researcher, Broadcasting/film/video in the Last Year: Never true    Ran Out of Food in the Last Year: Never true  Transportation Needs: No Transportation Needs (08/17/2024)   Epic    Lack of Transportation (Medical): No    Lack of Transportation (Non-Medical): No  Physical Activity: Insufficiently Active (08/17/2024)  Exercise Vital Sign    Days of Exercise per Week: 2 days    Minutes of Exercise per Session: 20 min  Stress: No Stress Concern Present (08/17/2024)   [employer] of Occupational Health - Occupational Stress Questionnaire    Feeling of Stress: Not at all  Social Connections: Moderately Isolated (08/17/2024)   Social Connection and Isolation Panel    Frequency of Communication with Friends and Family: Never    Frequency of Social Gatherings with Friends and Family: Once a week    Attends Religious Services: More than 4 times per year    Active Member of [employer] or Organizations: No    Attends Banker Meetings: Not on file    Marital Status: Married  Catering Manager Violence: Not At Risk (06/13/2022)   Received from [facility]   HITS    Over the last 12 months how often did your partner physically hurt you?: Never    Over the last 12 months how often did your partner insult you or talk down to you?: Never    Over the last 12 months how often did your partner threaten you with physical harm?: Never    Over the last 12 months how often did your partner scream or curse at you?: Never  Depression (PHQ2-9): Low Risk (02/22/2023)   Depression (PHQ2-9)    PHQ-2 Score: 0  Alcohol Screen:  Low Risk (08/17/2024)   Alcohol Screen    Last Alcohol Screening Score (AUDIT): 2  Housing: Low Risk (08/17/2024)   Epic    Unable to Pay for Housing in the Last Year: No    Number of Times Moved in the Last Year: 0    Homeless in the Last Year: No  Utilities: Not on file  Health Literacy: Not on file    ROS See HPI.  Objective    BP 125/76 (BP Location: Left Arm, Patient Position: Sitting, Cuff Size: Normal)   Pulse 84   Temp 98.7 F (37.1 C)   Ht 5' 11 (1.803 m)   Wt 187 lb 12 oz (85.2 kg)   SpO2 100%   BMI 26.19 kg/m  .SABRASABRA Results for orders placed or performed in visit on 08/18/24  POCT UA - Microalbumin   Collection Time: 08/18/24  9:30 AM  Result Value Ref Range   Microalbumin Ur, POC 10 mg/L   Creatinine, POC 50 mg/dL   Albumin/Creatinine Ratio, Urine, POC 30-300   POC SOFIA 2 FLU + SARS ANTIGEN FIA   Collection Time: 08/18/24  9:31 AM  Result Value Ref Range   Influenza A, POC Negative Negative   Influenza B, POC Negative Negative   SARS Coronavirus 2 Ag Negative Negative  CMP14+EGFR   Collection Time: 08/18/24  9:36 AM  Result Value Ref Range   Glucose 84 70 - 99 mg/dL   BUN 12 8 - 27 mg/dL   Creatinine, Ser 8.89 0.76 - 1.27 mg/dL   eGFR 77 >40 fO/fpw/8.26   BUN/Creatinine Ratio 11 10 - 24   Sodium 139 134 - 144 mmol/L   Potassium 4.0 3.5 - 5.2 mmol/L   Chloride 102 96 - 106 mmol/L   CO2 24 20 - 29 mmol/L   Calcium  9.5 8.6 - 10.2 mg/dL   Total Protein 6.7 6.0 - 8.5 g/dL   Albumin 4.4 3.8 - 4.9 g/dL   Globulin, Total 2.3 1.5 - 4.5 g/dL   Bilirubin Total 0.5 0.0 - 1.2 mg/dL   Alkaline Phosphatase 84 47 - 123  IU/L   AST 24 0 - 40 IU/L   ALT 26 0 - 44 IU/L  Uric acid   Collection Time: 08/18/24  9:36 AM  Result Value Ref Range   Uric Acid 5.3 3.8 - 8.4 mg/dL  Sed Rate (ESR)   Collection Time: 08/18/24  9:36 AM  Result Value Ref Range   Sed Rate 14 0 - 30 mm/hr  C-reactive protein   Collection Time: 08/18/24  9:36 AM  Result Value Ref Range    CRP 17 (H) 0 - 10 mg/L  Kappa/lambda light chains   Collection Time: 08/18/24  9:36 AM  Result Value Ref Range   Ig Kappa Free Light Chain 13.9 3.3 - 19.4 mg/L   Ig Lambda Free Light Chain 14.4 5.7 - 26.3 mg/L   KAPPA/LAMBDA RATIO 0.97 0.26 - 1.65  ANA,IFA RA Diag Pnl w/rflx Tit/Patn   Collection Time: 08/18/24  9:36 AM  Result Value Ref Range   ANA Titer 1 Negative    Rheumatoid fact SerPl-aCnc 10.0 <14.0 IU/mL   Cyclic Citrullin Peptide Ab 11 0 - 19 units    Physical Exam Constitutional:      Appearance: Normal appearance.  HENT:     Head: Normocephalic.  Cardiovascular:     Rate and Rhythm: Normal rate and regular rhythm.  Pulmonary:     Effort: Pulmonary effort is normal.     Breath sounds: Normal breath sounds.  Neurological:     General: No focal deficit present.     Mental Status: He is alert and oriented to person, place, and time.  Psychiatric:        Mood and Affect: Mood normal.        Assessment & Plan:  . Assessment & Plan Proteinuria and evaluation for chronic kidney disease Proteinuria detected, indicating potential early kidney dysfunction. Previous elevated free kappa and lambda light chains may relate to inflammation rather than kidney disease. Will recheck today. No significant decline in renal function. Differential includes chronic kidney disease or nephritis. Discussed potential nephrology referral and SGLT2 inhibitors for renal protection if indicated. - Ordered urine test for proteinuria. - Ordered blood tests for kidney function, uric acid levels, and inflammatory markers. - Consider nephrology referral if proteinuria persists or kidney function declines. - Discuss potential use of SGLT2 inhibitors for renal protection if indicated.  Viral syndrome with COVID-19 exposure Recent COVID-19 exposure with mild symptoms. COVID-19 test negative, but early testing may have been a factor. Symptoms suggestive of viral syndrome. - Advised rest and  symptomatic treatment. - Recommended over-the-counter COVID-19 testing if symptoms persist.  History of left elbow bursitis Previous left elbow bursitis with drainage and antibiotic treatment. - Some concern for gout- will get uric acid level tested today.   Medication management Current medications include duloxetine  and Nexium , with no need for refills at this time. - Continue current medications as prescribed.    Marge Vandermeulen, PA-C   "

## 2024-08-19 ENCOUNTER — Ambulatory Visit: Payer: Self-pay | Admitting: Physician Assistant

## 2024-08-19 NOTE — Progress Notes (Signed)
 Kiernan,   Kappa/LAMBDA normal.  Negative ANA.   All great news that nothing inflammatory is going on.   Yes GFR of 77 is slightly decreased. That is CKD stage 2. We honestly see a lot of people in this range. Kidneys checked at any time can flucuate highly. A little dehydration can effect the GFR quite a bit. It is not worrisome but always good to keep monitoring which we can do in 3 months to see how things are going. Ways to keep kidney healthy are staying hydrated, keep BP under 130/80, avoid regular use of anti-inflammatories. You have been in the 70s before with GFR and then bounced right back up. Normal GFR is above 90.

## 2024-08-19 NOTE — Progress Notes (Signed)
 Benjamin Leonard,   CRP, inflammatory markers, decreasing and better than 10 months ago.  ESR is normal.  Uric acid is normal.  KAPPA/LAMBDA still pending.

## 2024-08-20 LAB — ANA,IFA RA DIAG PNL W/RFLX TIT/PATN
ANA Titer 1: NEGATIVE
Cyclic Citrullin Peptide Ab: 11 U (ref 0–19)
Rheumatoid fact SerPl-aCnc: 10 [IU]/mL

## 2024-08-20 LAB — CMP14+EGFR
ALT: 26 IU/L (ref 0–44)
AST: 24 IU/L (ref 0–40)
Albumin: 4.4 g/dL (ref 3.8–4.9)
Alkaline Phosphatase: 84 IU/L (ref 47–123)
BUN/Creatinine Ratio: 11 (ref 10–24)
BUN: 12 mg/dL (ref 8–27)
Bilirubin Total: 0.5 mg/dL (ref 0.0–1.2)
CO2: 24 mmol/L (ref 20–29)
Calcium: 9.5 mg/dL (ref 8.6–10.2)
Chloride: 102 mmol/L (ref 96–106)
Creatinine, Ser: 1.1 mg/dL (ref 0.76–1.27)
Globulin, Total: 2.3 g/dL (ref 1.5–4.5)
Glucose: 84 mg/dL (ref 70–99)
Potassium: 4 mmol/L (ref 3.5–5.2)
Sodium: 139 mmol/L (ref 134–144)
Total Protein: 6.7 g/dL (ref 6.0–8.5)
eGFR: 77 mL/min/1.73

## 2024-08-20 LAB — C-REACTIVE PROTEIN: CRP: 17 mg/L — ABNORMAL HIGH (ref 0–10)

## 2024-08-20 LAB — SEDIMENTATION RATE: Sed Rate: 14 mm/h (ref 0–30)

## 2024-08-20 LAB — KAPPA/LAMBDA LIGHT CHAINS
Ig Kappa Free Light Chain: 13.9 mg/L (ref 3.3–19.4)
Ig Lambda Free Light Chain: 14.4 mg/L (ref 5.7–26.3)
KAPPA/LAMBDA RATIO: 0.97 (ref 0.26–1.65)

## 2024-08-20 LAB — URIC ACID: Uric Acid: 5.3 mg/dL (ref 3.8–8.4)

## 2024-08-21 ENCOUNTER — Encounter: Payer: Self-pay | Admitting: Physician Assistant
